# Patient Record
Sex: Female | Born: 1940 | Race: Black or African American | Hispanic: No | Marital: Married | Smoking: Former smoker
Health system: Southern US, Community
[De-identification: ages and names within clinical notes are randomized; demographics above are authoritative.]

## PROBLEM LIST (undated history)

## (undated) DIAGNOSIS — E7800 Pure hypercholesterolemia, unspecified: Secondary | ICD-10-CM

## (undated) DIAGNOSIS — I10 Essential (primary) hypertension: Secondary | ICD-10-CM

## (undated) DIAGNOSIS — N186 End stage renal disease: Secondary | ICD-10-CM

## (undated) DIAGNOSIS — Z992 Dependence on renal dialysis: Secondary | ICD-10-CM

## (undated) DIAGNOSIS — M1990 Unspecified osteoarthritis, unspecified site: Secondary | ICD-10-CM

## (undated) DIAGNOSIS — Z8701 Personal history of pneumonia (recurrent): Secondary | ICD-10-CM

## (undated) DIAGNOSIS — G629 Polyneuropathy, unspecified: Secondary | ICD-10-CM

## (undated) DIAGNOSIS — K219 Gastro-esophageal reflux disease without esophagitis: Secondary | ICD-10-CM

## (undated) HISTORY — PX: BONE MARROW BIOPSY: SHX199

## (undated) HISTORY — PX: BONE MARROW ASPIRATION: SHX1252

## (undated) HISTORY — DX: Gastro-esophageal reflux disease without esophagitis: K21.9

## (undated) HISTORY — PX: TUBAL LIGATION: SHX77

## (undated) HISTORY — PX: BREAST BIOPSY: SHX20

## (undated) HISTORY — PX: REFRACTIVE SURGERY: SHX103

## (undated) HISTORY — DX: Polyneuropathy, unspecified: G62.9

## (undated) HISTORY — DX: Pure hypercholesterolemia, unspecified: E78.00

---

## 2001-06-04 ENCOUNTER — Encounter (HOSPITAL_COMMUNITY): Admission: RE | Admit: 2001-06-04 | Discharge: 2001-07-04 | Payer: Self-pay | Admitting: Oncology

## 2001-06-27 ENCOUNTER — Encounter: Payer: Self-pay | Admitting: Internal Medicine

## 2001-06-27 ENCOUNTER — Ambulatory Visit (HOSPITAL_COMMUNITY): Admission: RE | Admit: 2001-06-27 | Discharge: 2001-06-27 | Payer: Self-pay | Admitting: Internal Medicine

## 2001-07-03 ENCOUNTER — Encounter: Admission: RE | Admit: 2001-07-03 | Discharge: 2001-07-03 | Payer: Self-pay | Admitting: Oncology

## 2001-07-14 ENCOUNTER — Encounter: Admission: RE | Admit: 2001-07-14 | Discharge: 2001-07-14 | Payer: Self-pay | Admitting: Oncology

## 2001-07-14 ENCOUNTER — Encounter (HOSPITAL_COMMUNITY): Admission: RE | Admit: 2001-07-14 | Discharge: 2001-08-13 | Payer: Self-pay | Admitting: Oncology

## 2001-08-29 ENCOUNTER — Encounter: Admission: RE | Admit: 2001-08-29 | Discharge: 2001-08-29 | Payer: Self-pay | Admitting: Oncology

## 2001-10-10 ENCOUNTER — Encounter (HOSPITAL_COMMUNITY): Admission: RE | Admit: 2001-10-10 | Discharge: 2001-11-09 | Payer: Self-pay | Admitting: Oncology

## 2001-10-20 ENCOUNTER — Observation Stay (HOSPITAL_COMMUNITY): Admission: EM | Admit: 2001-10-20 | Discharge: 2001-10-21 | Payer: Self-pay | Admitting: Emergency Medicine

## 2001-10-20 ENCOUNTER — Encounter: Payer: Self-pay | Admitting: Emergency Medicine

## 2001-11-14 ENCOUNTER — Encounter: Admission: RE | Admit: 2001-11-14 | Discharge: 2001-11-14 | Payer: Self-pay | Admitting: Oncology

## 2001-11-14 ENCOUNTER — Encounter (HOSPITAL_COMMUNITY): Admission: RE | Admit: 2001-11-14 | Discharge: 2001-12-14 | Payer: Self-pay | Admitting: Oncology

## 2001-12-26 ENCOUNTER — Encounter: Admission: RE | Admit: 2001-12-26 | Discharge: 2001-12-26 | Payer: Self-pay | Admitting: Oncology

## 2001-12-26 ENCOUNTER — Encounter (HOSPITAL_COMMUNITY): Admission: RE | Admit: 2001-12-26 | Discharge: 2002-01-25 | Payer: Self-pay | Admitting: Oncology

## 2002-03-06 ENCOUNTER — Encounter (HOSPITAL_COMMUNITY): Admission: RE | Admit: 2002-03-06 | Discharge: 2002-04-05 | Payer: Self-pay | Admitting: Oncology

## 2002-03-06 ENCOUNTER — Encounter: Admission: RE | Admit: 2002-03-06 | Discharge: 2002-03-06 | Payer: Self-pay | Admitting: Oncology

## 2002-04-16 ENCOUNTER — Encounter: Admission: RE | Admit: 2002-04-16 | Discharge: 2002-04-16 | Payer: Self-pay | Admitting: Oncology

## 2002-05-20 ENCOUNTER — Encounter: Admission: RE | Admit: 2002-05-20 | Discharge: 2002-05-20 | Payer: Self-pay | Admitting: Oncology

## 2002-05-20 ENCOUNTER — Encounter (HOSPITAL_COMMUNITY): Admission: RE | Admit: 2002-05-20 | Discharge: 2002-06-19 | Payer: Self-pay | Admitting: Oncology

## 2002-07-01 ENCOUNTER — Encounter: Admission: RE | Admit: 2002-07-01 | Discharge: 2002-07-01 | Payer: Self-pay | Admitting: Oncology

## 2002-10-27 ENCOUNTER — Encounter (HOSPITAL_COMMUNITY): Admission: RE | Admit: 2002-10-27 | Discharge: 2002-11-26 | Payer: Self-pay | Admitting: Oncology

## 2002-10-27 ENCOUNTER — Encounter: Admission: RE | Admit: 2002-10-27 | Discharge: 2002-10-27 | Payer: Self-pay | Admitting: Oncology

## 2002-10-29 ENCOUNTER — Encounter: Payer: Self-pay | Admitting: Internal Medicine

## 2002-10-29 ENCOUNTER — Ambulatory Visit (HOSPITAL_COMMUNITY): Admission: RE | Admit: 2002-10-29 | Discharge: 2002-10-29 | Payer: Self-pay | Admitting: Internal Medicine

## 2002-11-27 ENCOUNTER — Encounter: Admission: RE | Admit: 2002-11-27 | Discharge: 2002-11-27 | Payer: Self-pay | Admitting: Oncology

## 2002-11-27 ENCOUNTER — Encounter (HOSPITAL_COMMUNITY): Admission: RE | Admit: 2002-11-27 | Discharge: 2002-12-27 | Payer: Self-pay | Admitting: Oncology

## 2002-12-30 ENCOUNTER — Encounter: Admission: RE | Admit: 2002-12-30 | Discharge: 2002-12-30 | Payer: Self-pay | Admitting: Oncology

## 2002-12-30 ENCOUNTER — Encounter (HOSPITAL_COMMUNITY): Admission: RE | Admit: 2002-12-30 | Discharge: 2003-01-29 | Payer: Self-pay | Admitting: Oncology

## 2003-02-17 ENCOUNTER — Encounter (HOSPITAL_COMMUNITY): Admission: RE | Admit: 2003-02-17 | Discharge: 2003-03-19 | Payer: Self-pay | Admitting: Oncology

## 2003-02-17 ENCOUNTER — Encounter: Admission: RE | Admit: 2003-02-17 | Discharge: 2003-02-17 | Payer: Self-pay | Admitting: Oncology

## 2003-03-05 ENCOUNTER — Ambulatory Visit (HOSPITAL_COMMUNITY): Admission: RE | Admit: 2003-03-05 | Discharge: 2003-03-05 | Payer: Self-pay | Admitting: Internal Medicine

## 2003-04-28 ENCOUNTER — Encounter: Admission: RE | Admit: 2003-04-28 | Discharge: 2003-04-28 | Payer: Self-pay | Admitting: Oncology

## 2003-04-28 ENCOUNTER — Encounter (HOSPITAL_COMMUNITY): Admission: RE | Admit: 2003-04-28 | Discharge: 2003-05-28 | Payer: Self-pay | Admitting: Oncology

## 2003-05-06 ENCOUNTER — Ambulatory Visit (HOSPITAL_COMMUNITY): Admission: RE | Admit: 2003-05-06 | Discharge: 2003-05-06 | Payer: Self-pay | Admitting: Internal Medicine

## 2003-05-06 HISTORY — PX: ESOPHAGOGASTRODUODENOSCOPY: SHX1529

## 2003-05-07 ENCOUNTER — Ambulatory Visit (HOSPITAL_COMMUNITY): Admission: RE | Admit: 2003-05-07 | Discharge: 2003-05-07 | Payer: Self-pay | Admitting: Internal Medicine

## 2003-05-28 ENCOUNTER — Encounter: Admission: RE | Admit: 2003-05-28 | Discharge: 2003-05-28 | Payer: Self-pay | Admitting: Oncology

## 2003-05-28 ENCOUNTER — Encounter (HOSPITAL_COMMUNITY): Admission: RE | Admit: 2003-05-28 | Discharge: 2003-06-27 | Payer: Self-pay | Admitting: Oncology

## 2003-07-12 ENCOUNTER — Encounter: Admission: RE | Admit: 2003-07-12 | Discharge: 2003-07-12 | Payer: Self-pay | Admitting: Oncology

## 2003-08-16 ENCOUNTER — Encounter (HOSPITAL_COMMUNITY): Admission: RE | Admit: 2003-08-16 | Discharge: 2003-09-15 | Payer: Self-pay | Admitting: Oncology

## 2003-08-16 ENCOUNTER — Encounter: Admission: RE | Admit: 2003-08-16 | Discharge: 2003-08-16 | Payer: Self-pay | Admitting: Oncology

## 2003-08-23 ENCOUNTER — Ambulatory Visit (HOSPITAL_COMMUNITY): Admission: RE | Admit: 2003-08-23 | Discharge: 2003-08-23 | Payer: Self-pay | Admitting: Orthopedic Surgery

## 2003-10-04 ENCOUNTER — Encounter: Admission: RE | Admit: 2003-10-04 | Discharge: 2003-10-15 | Payer: Self-pay | Admitting: Oncology

## 2003-10-04 ENCOUNTER — Encounter (HOSPITAL_COMMUNITY): Admission: RE | Admit: 2003-10-04 | Discharge: 2003-10-15 | Payer: Self-pay | Admitting: Oncology

## 2003-10-12 ENCOUNTER — Ambulatory Visit: Payer: Self-pay | Admitting: Psychiatry

## 2003-10-25 ENCOUNTER — Encounter (HOSPITAL_COMMUNITY): Admission: RE | Admit: 2003-10-25 | Discharge: 2003-11-24 | Payer: Self-pay | Admitting: Oncology

## 2003-10-25 ENCOUNTER — Encounter: Admission: RE | Admit: 2003-10-25 | Discharge: 2003-10-25 | Payer: Self-pay | Admitting: Oncology

## 2003-11-29 ENCOUNTER — Encounter (HOSPITAL_COMMUNITY): Admission: RE | Admit: 2003-11-29 | Discharge: 2003-12-29 | Payer: Self-pay | Admitting: Oncology

## 2003-11-29 ENCOUNTER — Ambulatory Visit (HOSPITAL_COMMUNITY): Payer: Self-pay | Admitting: Oncology

## 2003-11-29 ENCOUNTER — Encounter: Admission: RE | Admit: 2003-11-29 | Discharge: 2003-11-29 | Payer: Self-pay | Admitting: Oncology

## 2003-12-21 ENCOUNTER — Ambulatory Visit: Payer: Self-pay | Admitting: Psychiatry

## 2004-01-18 ENCOUNTER — Encounter (HOSPITAL_COMMUNITY): Admission: RE | Admit: 2004-01-18 | Discharge: 2004-02-17 | Payer: Self-pay | Admitting: Oncology

## 2004-01-18 ENCOUNTER — Ambulatory Visit (HOSPITAL_COMMUNITY): Payer: Self-pay | Admitting: Oncology

## 2004-01-18 ENCOUNTER — Encounter: Admission: RE | Admit: 2004-01-18 | Discharge: 2004-01-18 | Payer: Self-pay | Admitting: Oncology

## 2004-02-15 ENCOUNTER — Ambulatory Visit: Payer: Self-pay | Admitting: Psychiatry

## 2004-02-21 ENCOUNTER — Ambulatory Visit: Payer: Self-pay | Admitting: Orthopedic Surgery

## 2004-03-16 ENCOUNTER — Encounter: Admission: RE | Admit: 2004-03-16 | Discharge: 2004-03-16 | Payer: Self-pay | Admitting: Oncology

## 2004-03-16 ENCOUNTER — Ambulatory Visit (HOSPITAL_COMMUNITY): Payer: Self-pay | Admitting: Oncology

## 2004-03-16 ENCOUNTER — Encounter (HOSPITAL_COMMUNITY): Admission: RE | Admit: 2004-03-16 | Discharge: 2004-04-15 | Payer: Self-pay | Admitting: Oncology

## 2004-04-11 ENCOUNTER — Ambulatory Visit: Payer: Self-pay | Admitting: Psychiatry

## 2004-05-12 ENCOUNTER — Encounter: Admission: RE | Admit: 2004-05-12 | Discharge: 2004-05-12 | Payer: Self-pay | Admitting: Oncology

## 2004-05-12 ENCOUNTER — Encounter (HOSPITAL_COMMUNITY): Admission: RE | Admit: 2004-05-12 | Discharge: 2004-06-11 | Payer: Self-pay | Admitting: Oncology

## 2004-05-12 ENCOUNTER — Ambulatory Visit (HOSPITAL_COMMUNITY): Payer: Self-pay | Admitting: Oncology

## 2004-06-29 ENCOUNTER — Ambulatory Visit: Payer: Self-pay | Admitting: Psychiatry

## 2004-06-30 ENCOUNTER — Ambulatory Visit (HOSPITAL_COMMUNITY): Payer: Self-pay | Admitting: Oncology

## 2004-07-14 ENCOUNTER — Encounter: Admission: RE | Admit: 2004-07-14 | Discharge: 2004-07-14 | Payer: Self-pay | Admitting: Oncology

## 2004-07-14 ENCOUNTER — Encounter (HOSPITAL_COMMUNITY): Admission: RE | Admit: 2004-07-14 | Discharge: 2004-08-13 | Payer: Self-pay | Admitting: Oncology

## 2004-08-25 ENCOUNTER — Encounter: Admission: RE | Admit: 2004-08-25 | Discharge: 2004-08-25 | Payer: Self-pay | Admitting: Oncology

## 2004-08-25 ENCOUNTER — Ambulatory Visit (HOSPITAL_COMMUNITY): Payer: Self-pay | Admitting: Oncology

## 2004-08-25 ENCOUNTER — Encounter (HOSPITAL_COMMUNITY): Admission: RE | Admit: 2004-08-25 | Discharge: 2004-09-24 | Payer: Self-pay | Admitting: Oncology

## 2004-09-28 ENCOUNTER — Ambulatory Visit: Payer: Self-pay | Admitting: Psychiatry

## 2004-10-03 ENCOUNTER — Encounter (HOSPITAL_COMMUNITY): Admission: RE | Admit: 2004-10-03 | Discharge: 2004-10-14 | Payer: Self-pay | Admitting: Oncology

## 2004-10-03 ENCOUNTER — Encounter: Admission: RE | Admit: 2004-10-03 | Discharge: 2004-10-14 | Payer: Self-pay | Admitting: Oncology

## 2004-10-20 ENCOUNTER — Ambulatory Visit (HOSPITAL_COMMUNITY): Payer: Self-pay | Admitting: Oncology

## 2004-10-20 ENCOUNTER — Encounter: Admission: RE | Admit: 2004-10-20 | Discharge: 2004-10-20 | Payer: Self-pay | Admitting: Oncology

## 2004-12-13 ENCOUNTER — Ambulatory Visit (HOSPITAL_COMMUNITY): Payer: Self-pay | Admitting: Oncology

## 2004-12-13 ENCOUNTER — Encounter (HOSPITAL_COMMUNITY): Admission: RE | Admit: 2004-12-13 | Discharge: 2005-01-12 | Payer: Self-pay | Admitting: Oncology

## 2004-12-13 ENCOUNTER — Encounter: Admission: RE | Admit: 2004-12-13 | Discharge: 2004-12-13 | Payer: Self-pay | Admitting: Oncology

## 2004-12-27 ENCOUNTER — Ambulatory Visit: Payer: Self-pay | Admitting: Internal Medicine

## 2004-12-28 ENCOUNTER — Ambulatory Visit: Payer: Self-pay | Admitting: Psychiatry

## 2005-01-30 ENCOUNTER — Ambulatory Visit (HOSPITAL_COMMUNITY): Payer: Self-pay | Admitting: Oncology

## 2005-01-30 ENCOUNTER — Encounter: Admission: RE | Admit: 2005-01-30 | Discharge: 2005-01-30 | Payer: Self-pay | Admitting: Oncology

## 2005-01-30 ENCOUNTER — Encounter (HOSPITAL_COMMUNITY): Admission: RE | Admit: 2005-01-30 | Discharge: 2005-03-01 | Payer: Self-pay | Admitting: Oncology

## 2005-03-07 ENCOUNTER — Encounter: Admission: RE | Admit: 2005-03-07 | Discharge: 2005-03-07 | Payer: Self-pay | Admitting: Oncology

## 2005-03-07 ENCOUNTER — Encounter (HOSPITAL_COMMUNITY): Admission: RE | Admit: 2005-03-07 | Discharge: 2005-04-06 | Payer: Self-pay | Admitting: Oncology

## 2005-03-27 ENCOUNTER — Ambulatory Visit (HOSPITAL_COMMUNITY): Payer: Self-pay | Admitting: Psychiatry

## 2005-04-04 ENCOUNTER — Ambulatory Visit (HOSPITAL_COMMUNITY): Payer: Self-pay | Admitting: Oncology

## 2005-05-02 ENCOUNTER — Encounter (HOSPITAL_COMMUNITY): Admission: RE | Admit: 2005-05-02 | Discharge: 2005-06-01 | Payer: Self-pay | Admitting: Oncology

## 2005-05-02 ENCOUNTER — Encounter: Admission: RE | Admit: 2005-05-02 | Discharge: 2005-05-02 | Payer: Self-pay | Admitting: Oncology

## 2005-05-16 ENCOUNTER — Ambulatory Visit (HOSPITAL_COMMUNITY): Admission: RE | Admit: 2005-05-16 | Discharge: 2005-05-16 | Payer: Self-pay | Admitting: Internal Medicine

## 2005-05-22 ENCOUNTER — Ambulatory Visit (HOSPITAL_COMMUNITY): Admission: RE | Admit: 2005-05-22 | Discharge: 2005-05-22 | Payer: Self-pay | Admitting: Internal Medicine

## 2005-05-30 ENCOUNTER — Ambulatory Visit (HOSPITAL_COMMUNITY): Payer: Self-pay | Admitting: Oncology

## 2005-06-13 ENCOUNTER — Encounter: Admission: RE | Admit: 2005-06-13 | Discharge: 2005-06-13 | Payer: Self-pay | Admitting: Oncology

## 2005-06-13 ENCOUNTER — Encounter (HOSPITAL_COMMUNITY): Admission: RE | Admit: 2005-06-13 | Discharge: 2005-07-13 | Payer: Self-pay | Admitting: Oncology

## 2005-06-26 ENCOUNTER — Ambulatory Visit (HOSPITAL_COMMUNITY): Payer: Self-pay | Admitting: Psychiatry

## 2005-07-27 ENCOUNTER — Encounter: Admission: RE | Admit: 2005-07-27 | Discharge: 2005-07-27 | Payer: Self-pay | Admitting: Oncology

## 2005-07-27 ENCOUNTER — Encounter (HOSPITAL_COMMUNITY): Admission: RE | Admit: 2005-07-27 | Discharge: 2005-08-26 | Payer: Self-pay | Admitting: Oncology

## 2005-07-27 ENCOUNTER — Ambulatory Visit (HOSPITAL_COMMUNITY): Payer: Self-pay | Admitting: Oncology

## 2005-09-13 ENCOUNTER — Ambulatory Visit (HOSPITAL_COMMUNITY): Payer: Self-pay | Admitting: Psychiatry

## 2005-09-19 ENCOUNTER — Encounter (HOSPITAL_COMMUNITY): Admission: RE | Admit: 2005-09-19 | Discharge: 2005-10-12 | Payer: Self-pay | Admitting: Oncology

## 2005-09-19 ENCOUNTER — Encounter: Admission: RE | Admit: 2005-09-19 | Discharge: 2005-10-12 | Payer: Self-pay | Admitting: Oncology

## 2005-10-10 ENCOUNTER — Ambulatory Visit (HOSPITAL_COMMUNITY): Payer: Self-pay | Admitting: Oncology

## 2005-10-31 ENCOUNTER — Encounter (HOSPITAL_COMMUNITY): Admission: RE | Admit: 2005-10-31 | Discharge: 2005-11-30 | Payer: Self-pay | Admitting: Oncology

## 2005-10-31 ENCOUNTER — Encounter: Admission: RE | Admit: 2005-10-31 | Discharge: 2005-10-31 | Payer: Self-pay | Admitting: Oncology

## 2005-11-08 ENCOUNTER — Ambulatory Visit (HOSPITAL_COMMUNITY): Payer: Self-pay | Admitting: Psychiatry

## 2005-12-05 ENCOUNTER — Ambulatory Visit (HOSPITAL_COMMUNITY): Payer: Self-pay | Admitting: Oncology

## 2005-12-05 ENCOUNTER — Encounter (HOSPITAL_COMMUNITY): Admission: RE | Admit: 2005-12-05 | Discharge: 2006-01-04 | Payer: Self-pay | Admitting: Oncology

## 2006-01-01 ENCOUNTER — Ambulatory Visit (HOSPITAL_COMMUNITY): Payer: Self-pay | Admitting: Psychiatry

## 2006-01-17 ENCOUNTER — Encounter (HOSPITAL_COMMUNITY): Admission: RE | Admit: 2006-01-17 | Discharge: 2006-02-16 | Payer: Self-pay | Admitting: Oncology

## 2006-01-31 ENCOUNTER — Ambulatory Visit (HOSPITAL_COMMUNITY): Payer: Self-pay | Admitting: Psychiatry

## 2006-02-19 ENCOUNTER — Encounter (HOSPITAL_COMMUNITY): Admission: RE | Admit: 2006-02-19 | Discharge: 2006-03-21 | Payer: Self-pay | Admitting: Oncology

## 2006-02-19 ENCOUNTER — Ambulatory Visit (HOSPITAL_COMMUNITY): Payer: Self-pay | Admitting: Oncology

## 2006-03-05 ENCOUNTER — Ambulatory Visit (HOSPITAL_COMMUNITY): Payer: Self-pay | Admitting: Psychiatry

## 2006-04-09 ENCOUNTER — Encounter (HOSPITAL_COMMUNITY): Admission: RE | Admit: 2006-04-09 | Discharge: 2006-05-09 | Payer: Self-pay | Admitting: Oncology

## 2006-04-23 ENCOUNTER — Ambulatory Visit (HOSPITAL_COMMUNITY): Payer: Self-pay | Admitting: Oncology

## 2006-05-02 ENCOUNTER — Ambulatory Visit (HOSPITAL_COMMUNITY): Payer: Self-pay | Admitting: Psychiatry

## 2006-05-28 ENCOUNTER — Encounter (HOSPITAL_COMMUNITY): Admission: RE | Admit: 2006-05-28 | Discharge: 2006-06-27 | Payer: Self-pay | Admitting: Oncology

## 2006-05-30 ENCOUNTER — Ambulatory Visit (HOSPITAL_COMMUNITY): Payer: Self-pay | Admitting: Psychiatry

## 2006-07-02 ENCOUNTER — Encounter (HOSPITAL_COMMUNITY): Admission: RE | Admit: 2006-07-02 | Discharge: 2006-08-01 | Payer: Self-pay | Admitting: Oncology

## 2006-07-02 ENCOUNTER — Ambulatory Visit (HOSPITAL_COMMUNITY): Payer: Self-pay | Admitting: Oncology

## 2006-08-08 ENCOUNTER — Encounter (HOSPITAL_COMMUNITY): Admission: RE | Admit: 2006-08-08 | Discharge: 2006-09-07 | Payer: Self-pay | Admitting: Oncology

## 2006-08-15 ENCOUNTER — Ambulatory Visit (HOSPITAL_COMMUNITY): Payer: Self-pay | Admitting: Psychiatry

## 2006-09-12 ENCOUNTER — Encounter (HOSPITAL_COMMUNITY): Admission: RE | Admit: 2006-09-12 | Discharge: 2006-10-12 | Payer: Self-pay | Admitting: Oncology

## 2006-09-12 ENCOUNTER — Ambulatory Visit (HOSPITAL_COMMUNITY): Payer: Self-pay | Admitting: Psychiatry

## 2006-09-12 ENCOUNTER — Ambulatory Visit (HOSPITAL_COMMUNITY): Payer: Self-pay | Admitting: Oncology

## 2006-10-17 ENCOUNTER — Encounter (HOSPITAL_COMMUNITY): Admission: RE | Admit: 2006-10-17 | Discharge: 2006-11-16 | Payer: Self-pay | Admitting: Oncology

## 2006-11-12 ENCOUNTER — Ambulatory Visit (HOSPITAL_COMMUNITY): Payer: Self-pay | Admitting: Oncology

## 2006-11-12 ENCOUNTER — Ambulatory Visit (HOSPITAL_COMMUNITY): Payer: Self-pay | Admitting: Psychiatry

## 2006-11-15 ENCOUNTER — Other Ambulatory Visit: Admission: RE | Admit: 2006-11-15 | Discharge: 2006-11-15 | Payer: Self-pay | Admitting: Obstetrics and Gynecology

## 2006-12-06 ENCOUNTER — Encounter (HOSPITAL_COMMUNITY): Admission: RE | Admit: 2006-12-06 | Discharge: 2007-01-05 | Payer: Self-pay | Admitting: Oncology

## 2007-01-07 ENCOUNTER — Ambulatory Visit (HOSPITAL_COMMUNITY): Payer: Self-pay | Admitting: Oncology

## 2007-01-07 ENCOUNTER — Encounter (HOSPITAL_COMMUNITY): Admission: RE | Admit: 2007-01-07 | Discharge: 2007-01-15 | Payer: Self-pay | Admitting: Oncology

## 2007-01-07 ENCOUNTER — Other Ambulatory Visit (HOSPITAL_COMMUNITY): Payer: Self-pay | Admitting: Oncology

## 2007-01-07 ENCOUNTER — Ambulatory Visit (HOSPITAL_COMMUNITY): Payer: Self-pay | Admitting: Psychiatry

## 2007-02-07 ENCOUNTER — Encounter (HOSPITAL_COMMUNITY): Admission: RE | Admit: 2007-02-07 | Discharge: 2007-03-09 | Payer: Self-pay | Admitting: Oncology

## 2007-02-07 ENCOUNTER — Ambulatory Visit (HOSPITAL_COMMUNITY): Payer: Self-pay | Admitting: Oncology

## 2007-03-06 ENCOUNTER — Ambulatory Visit (HOSPITAL_COMMUNITY): Payer: Self-pay | Admitting: Oncology

## 2007-03-06 ENCOUNTER — Ambulatory Visit (HOSPITAL_COMMUNITY): Payer: Self-pay | Admitting: Psychiatry

## 2007-04-03 ENCOUNTER — Ambulatory Visit (HOSPITAL_COMMUNITY): Admission: RE | Admit: 2007-04-03 | Discharge: 2007-04-03 | Payer: Self-pay | Admitting: Nephrology

## 2007-04-03 ENCOUNTER — Ambulatory Visit: Payer: Self-pay | Admitting: Oncology

## 2007-04-03 ENCOUNTER — Encounter (HOSPITAL_COMMUNITY): Admission: RE | Admit: 2007-04-03 | Discharge: 2007-05-03 | Payer: Self-pay | Admitting: Oncology

## 2007-05-01 ENCOUNTER — Ambulatory Visit (HOSPITAL_COMMUNITY): Payer: Self-pay | Admitting: Oncology

## 2007-05-01 ENCOUNTER — Other Ambulatory Visit (HOSPITAL_COMMUNITY): Payer: Self-pay | Admitting: Oncology

## 2007-05-29 ENCOUNTER — Encounter (HOSPITAL_COMMUNITY): Admission: RE | Admit: 2007-05-29 | Discharge: 2007-06-28 | Payer: Self-pay | Admitting: Oncology

## 2007-06-02 ENCOUNTER — Ambulatory Visit: Payer: Self-pay | Admitting: Gastroenterology

## 2007-06-03 ENCOUNTER — Ambulatory Visit (HOSPITAL_COMMUNITY): Payer: Self-pay | Admitting: Psychiatry

## 2007-07-18 ENCOUNTER — Ambulatory Visit (HOSPITAL_COMMUNITY): Payer: Self-pay | Admitting: Oncology

## 2007-07-18 ENCOUNTER — Encounter (HOSPITAL_COMMUNITY): Admission: RE | Admit: 2007-07-18 | Discharge: 2007-08-17 | Payer: Self-pay | Admitting: Oncology

## 2007-08-07 DIAGNOSIS — K219 Gastro-esophageal reflux disease without esophagitis: Secondary | ICD-10-CM | POA: Insufficient documentation

## 2007-08-07 DIAGNOSIS — M129 Arthropathy, unspecified: Secondary | ICD-10-CM | POA: Insufficient documentation

## 2007-08-07 DIAGNOSIS — Z8719 Personal history of other diseases of the digestive system: Secondary | ICD-10-CM

## 2007-08-07 DIAGNOSIS — J45909 Unspecified asthma, uncomplicated: Secondary | ICD-10-CM | POA: Insufficient documentation

## 2007-08-07 DIAGNOSIS — M62838 Other muscle spasm: Secondary | ICD-10-CM | POA: Insufficient documentation

## 2007-08-07 DIAGNOSIS — I10 Essential (primary) hypertension: Secondary | ICD-10-CM | POA: Insufficient documentation

## 2007-08-07 DIAGNOSIS — F329 Major depressive disorder, single episode, unspecified: Secondary | ICD-10-CM

## 2007-08-07 DIAGNOSIS — F419 Anxiety disorder, unspecified: Secondary | ICD-10-CM | POA: Insufficient documentation

## 2007-08-07 DIAGNOSIS — M810 Age-related osteoporosis without current pathological fracture: Secondary | ICD-10-CM

## 2007-08-07 DIAGNOSIS — D638 Anemia in other chronic diseases classified elsewhere: Secondary | ICD-10-CM

## 2007-08-07 DIAGNOSIS — R1310 Dysphagia, unspecified: Secondary | ICD-10-CM | POA: Insufficient documentation

## 2007-09-02 ENCOUNTER — Ambulatory Visit (HOSPITAL_COMMUNITY): Payer: Self-pay | Admitting: Psychiatry

## 2007-09-12 ENCOUNTER — Encounter (HOSPITAL_COMMUNITY): Admission: RE | Admit: 2007-09-12 | Discharge: 2007-10-12 | Payer: Self-pay | Admitting: Oncology

## 2007-09-12 ENCOUNTER — Ambulatory Visit (HOSPITAL_COMMUNITY): Payer: Self-pay | Admitting: Oncology

## 2007-10-02 ENCOUNTER — Ambulatory Visit (HOSPITAL_COMMUNITY): Payer: Self-pay | Admitting: Psychiatry

## 2007-10-24 ENCOUNTER — Encounter (HOSPITAL_COMMUNITY): Admission: RE | Admit: 2007-10-24 | Discharge: 2007-11-23 | Payer: Self-pay | Admitting: Oncology

## 2007-11-14 ENCOUNTER — Ambulatory Visit (HOSPITAL_COMMUNITY): Payer: Self-pay | Admitting: Oncology

## 2007-11-14 ENCOUNTER — Other Ambulatory Visit (HOSPITAL_COMMUNITY): Payer: Self-pay | Admitting: Oncology

## 2007-11-25 ENCOUNTER — Other Ambulatory Visit: Admission: RE | Admit: 2007-11-25 | Discharge: 2007-11-25 | Payer: Self-pay | Admitting: Obstetrics and Gynecology

## 2007-12-02 ENCOUNTER — Ambulatory Visit (HOSPITAL_COMMUNITY): Payer: Self-pay | Admitting: Psychiatry

## 2007-12-05 ENCOUNTER — Encounter (HOSPITAL_COMMUNITY): Admission: RE | Admit: 2007-12-05 | Discharge: 2008-01-04 | Payer: Self-pay | Admitting: Oncology

## 2007-12-23 ENCOUNTER — Ambulatory Visit (HOSPITAL_COMMUNITY): Admission: RE | Admit: 2007-12-23 | Discharge: 2007-12-23 | Payer: Self-pay | Admitting: Ophthalmology

## 2008-01-06 ENCOUNTER — Ambulatory Visit (HOSPITAL_COMMUNITY): Admission: RE | Admit: 2008-01-06 | Discharge: 2008-01-06 | Payer: Self-pay | Admitting: Ophthalmology

## 2008-01-19 ENCOUNTER — Ambulatory Visit (HOSPITAL_COMMUNITY): Payer: Self-pay | Admitting: Oncology

## 2008-01-19 ENCOUNTER — Encounter (HOSPITAL_COMMUNITY): Admission: RE | Admit: 2008-01-19 | Discharge: 2008-02-18 | Payer: Self-pay | Admitting: Oncology

## 2008-01-29 ENCOUNTER — Ambulatory Visit (HOSPITAL_COMMUNITY): Payer: Self-pay | Admitting: Psychiatry

## 2008-03-01 ENCOUNTER — Encounter (HOSPITAL_COMMUNITY): Admission: RE | Admit: 2008-03-01 | Discharge: 2008-03-31 | Payer: Self-pay | Admitting: Oncology

## 2008-03-22 ENCOUNTER — Ambulatory Visit (HOSPITAL_COMMUNITY): Payer: Self-pay | Admitting: Oncology

## 2008-03-25 ENCOUNTER — Ambulatory Visit (HOSPITAL_COMMUNITY): Payer: Self-pay | Admitting: Psychiatry

## 2008-04-13 ENCOUNTER — Encounter (HOSPITAL_COMMUNITY): Admission: RE | Admit: 2008-04-13 | Discharge: 2008-05-14 | Payer: Self-pay | Admitting: Oncology

## 2008-05-25 ENCOUNTER — Encounter (HOSPITAL_COMMUNITY): Admission: RE | Admit: 2008-05-25 | Discharge: 2008-06-24 | Payer: Self-pay | Admitting: Oncology

## 2008-05-25 ENCOUNTER — Ambulatory Visit (HOSPITAL_COMMUNITY): Payer: Self-pay | Admitting: Oncology

## 2008-05-27 ENCOUNTER — Ambulatory Visit (HOSPITAL_COMMUNITY): Payer: Self-pay | Admitting: Psychiatry

## 2008-06-22 ENCOUNTER — Ambulatory Visit (HOSPITAL_COMMUNITY): Payer: Self-pay | Admitting: Psychiatry

## 2008-07-06 ENCOUNTER — Other Ambulatory Visit (HOSPITAL_COMMUNITY): Payer: Self-pay | Admitting: Oncology

## 2008-07-06 ENCOUNTER — Encounter (HOSPITAL_COMMUNITY): Admission: RE | Admit: 2008-07-06 | Discharge: 2008-08-05 | Payer: Self-pay | Admitting: Oncology

## 2008-07-27 ENCOUNTER — Ambulatory Visit (HOSPITAL_COMMUNITY): Payer: Self-pay | Admitting: Oncology

## 2008-08-19 ENCOUNTER — Ambulatory Visit (HOSPITAL_COMMUNITY): Payer: Self-pay | Admitting: Psychiatry

## 2008-08-31 ENCOUNTER — Encounter (HOSPITAL_COMMUNITY): Admission: RE | Admit: 2008-08-31 | Discharge: 2008-09-30 | Payer: Self-pay | Admitting: Oncology

## 2008-08-31 ENCOUNTER — Other Ambulatory Visit (HOSPITAL_COMMUNITY): Payer: Self-pay | Admitting: Oncology

## 2008-09-29 ENCOUNTER — Ambulatory Visit (HOSPITAL_COMMUNITY): Payer: Self-pay | Admitting: Oncology

## 2008-09-29 ENCOUNTER — Other Ambulatory Visit (HOSPITAL_COMMUNITY): Payer: Self-pay | Admitting: Oncology

## 2008-10-27 ENCOUNTER — Encounter (HOSPITAL_COMMUNITY): Admission: RE | Admit: 2008-10-27 | Discharge: 2008-11-26 | Payer: Self-pay | Admitting: Oncology

## 2008-11-18 ENCOUNTER — Ambulatory Visit (HOSPITAL_COMMUNITY): Payer: Self-pay | Admitting: Psychiatry

## 2008-12-02 ENCOUNTER — Ambulatory Visit (HOSPITAL_COMMUNITY): Payer: Self-pay | Admitting: Oncology

## 2008-12-02 ENCOUNTER — Encounter (HOSPITAL_COMMUNITY): Admission: RE | Admit: 2008-12-02 | Discharge: 2009-01-01 | Payer: Self-pay | Admitting: Oncology

## 2009-01-14 ENCOUNTER — Encounter (HOSPITAL_COMMUNITY): Admission: RE | Admit: 2009-01-14 | Discharge: 2009-01-14 | Payer: Self-pay | Admitting: Oncology

## 2009-02-11 ENCOUNTER — Other Ambulatory Visit (HOSPITAL_COMMUNITY): Payer: Self-pay | Admitting: Oncology

## 2009-02-11 ENCOUNTER — Ambulatory Visit (HOSPITAL_COMMUNITY): Payer: Self-pay | Admitting: Oncology

## 2009-02-11 ENCOUNTER — Encounter (HOSPITAL_COMMUNITY): Admission: RE | Admit: 2009-02-11 | Discharge: 2009-03-13 | Payer: Self-pay | Admitting: Oncology

## 2009-02-15 ENCOUNTER — Ambulatory Visit (HOSPITAL_COMMUNITY): Payer: Self-pay | Admitting: Psychiatry

## 2009-03-01 ENCOUNTER — Ambulatory Visit (HOSPITAL_COMMUNITY): Admission: RE | Admit: 2009-03-01 | Discharge: 2009-03-01 | Payer: Self-pay | Admitting: Internal Medicine

## 2009-03-11 ENCOUNTER — Other Ambulatory Visit (HOSPITAL_COMMUNITY): Payer: Self-pay | Admitting: Oncology

## 2009-03-21 ENCOUNTER — Encounter (HOSPITAL_COMMUNITY): Admission: RE | Admit: 2009-03-21 | Discharge: 2009-04-20 | Payer: Self-pay | Admitting: Oncology

## 2009-04-07 ENCOUNTER — Ambulatory Visit (HOSPITAL_COMMUNITY): Payer: Self-pay | Admitting: Oncology

## 2009-05-19 ENCOUNTER — Ambulatory Visit (HOSPITAL_COMMUNITY): Payer: Self-pay | Admitting: Psychiatry

## 2009-06-10 ENCOUNTER — Ambulatory Visit (HOSPITAL_COMMUNITY): Payer: Self-pay | Admitting: Oncology

## 2009-06-10 ENCOUNTER — Encounter (HOSPITAL_COMMUNITY): Admission: RE | Admit: 2009-06-10 | Discharge: 2009-07-10 | Payer: Self-pay | Admitting: Oncology

## 2009-07-13 ENCOUNTER — Other Ambulatory Visit (HOSPITAL_COMMUNITY): Payer: Self-pay | Admitting: Oncology

## 2009-07-13 ENCOUNTER — Encounter (HOSPITAL_COMMUNITY): Admission: RE | Admit: 2009-07-13 | Discharge: 2009-08-12 | Payer: Self-pay | Admitting: Oncology

## 2009-07-27 ENCOUNTER — Ambulatory Visit (HOSPITAL_COMMUNITY): Payer: Self-pay | Admitting: Oncology

## 2009-07-28 ENCOUNTER — Ambulatory Visit (HOSPITAL_COMMUNITY): Payer: Self-pay | Admitting: Oncology

## 2009-07-29 ENCOUNTER — Encounter (INDEPENDENT_AMBULATORY_CARE_PROVIDER_SITE_OTHER): Payer: Self-pay

## 2009-08-02 ENCOUNTER — Encounter (INDEPENDENT_AMBULATORY_CARE_PROVIDER_SITE_OTHER): Payer: Self-pay | Admitting: *Deleted

## 2009-08-15 ENCOUNTER — Encounter (HOSPITAL_COMMUNITY): Admission: RE | Admit: 2009-08-15 | Discharge: 2009-09-14 | Payer: Self-pay | Admitting: Oncology

## 2009-08-18 ENCOUNTER — Ambulatory Visit (HOSPITAL_COMMUNITY): Payer: Self-pay | Admitting: Psychiatry

## 2009-09-20 ENCOUNTER — Ambulatory Visit (HOSPITAL_COMMUNITY): Payer: Self-pay | Admitting: Oncology

## 2009-09-20 ENCOUNTER — Other Ambulatory Visit (HOSPITAL_COMMUNITY): Payer: Self-pay | Admitting: Oncology

## 2009-09-20 ENCOUNTER — Encounter (HOSPITAL_COMMUNITY): Admission: RE | Admit: 2009-09-20 | Discharge: 2009-10-14 | Payer: Self-pay | Admitting: Oncology

## 2009-11-29 ENCOUNTER — Ambulatory Visit (HOSPITAL_COMMUNITY): Payer: Self-pay | Admitting: Oncology

## 2009-11-29 ENCOUNTER — Encounter (HOSPITAL_COMMUNITY)
Admission: RE | Admit: 2009-11-29 | Discharge: 2009-12-29 | Payer: Self-pay | Source: Home / Self Care | Attending: Oncology | Admitting: Oncology

## 2009-12-27 ENCOUNTER — Encounter (HOSPITAL_COMMUNITY)
Admission: RE | Admit: 2009-12-27 | Discharge: 2010-01-26 | Payer: Self-pay | Source: Home / Self Care | Attending: Nephrology | Admitting: Nephrology

## 2010-01-05 ENCOUNTER — Ambulatory Visit (HOSPITAL_COMMUNITY): Payer: Self-pay | Admitting: Psychiatry

## 2010-01-30 ENCOUNTER — Ambulatory Visit (HOSPITAL_COMMUNITY)
Admission: RE | Admit: 2010-01-30 | Discharge: 2010-02-14 | Payer: Self-pay | Source: Home / Self Care | Attending: Oncology | Admitting: Oncology

## 2010-01-30 ENCOUNTER — Other Ambulatory Visit (HOSPITAL_COMMUNITY): Payer: Self-pay | Admitting: Oncology

## 2010-01-30 ENCOUNTER — Encounter (HOSPITAL_COMMUNITY)
Admission: RE | Admit: 2010-01-30 | Discharge: 2010-02-14 | Payer: Self-pay | Source: Home / Self Care | Attending: Oncology | Admitting: Oncology

## 2010-02-01 LAB — CBC
HCT: 34.9 % — ABNORMAL LOW (ref 36.0–46.0)
Hemoglobin: 11.6 g/dL — ABNORMAL LOW (ref 12.0–15.0)
MCH: 26.7 pg (ref 26.0–34.0)
MCHC: 33.2 g/dL (ref 30.0–36.0)
MCV: 80.2 fL (ref 78.0–100.0)
Platelets: 221 10*3/uL (ref 150–400)
RBC: 4.35 MIL/uL (ref 3.87–5.11)
RDW: 14.8 % (ref 11.5–15.5)
WBC: 5.7 10*3/uL (ref 4.0–10.5)

## 2010-02-14 ENCOUNTER — Ambulatory Visit (HOSPITAL_COMMUNITY)
Admission: RE | Admit: 2010-02-14 | Discharge: 2010-02-14 | Payer: Self-pay | Source: Home / Self Care | Attending: Psychiatry | Admitting: Psychiatry

## 2010-02-14 NOTE — Letter (Signed)
Summary: Recall Colonoscopy/Endoscopy, Change to Office Visit  [facility]  [address]   [place], [place] [address]   Phone: [phone]  Fax: [phone]      July 29, 2009   [name] [address] [place], [place]  [address] Jun 09, 1940   Dear [name],   According to our records, it is time for you to schedule a Colonoscopy/Endoscopy. However, after reviewing your medical record, we recommend an office visit in order to determine your need for a repeat procedure.  Please call [phone] at your convenience to schedule an office visit. If you have any questions or concerns, please feel free to contact our office.   Sincerely,   Cloria Spring LPN  [facility] Ph: [phone]   Fax: [phone]

## 2010-02-14 NOTE — Letter (Signed)
Summary: Scheduled Appointment  [facility]  [address]   [place], [place] [address]   Phone: [phone]  Fax: [phone]    August 02, 2009   Dear: [name]            DOB: 1940/10/05    I have been instructed to schedule you an appointment in our office.  Your appointment is as follows:   Date:               August 29, 2009   Time:                2PM    Please be here 15 minutes early.   Provider:            DR FIELDS    Please contact the office if you need to reschedule this appointment for a more convenient time.   Thank you,    Diana Eves       [facility] Ph: [phone]   Fax: [phone]

## 2010-02-24 ENCOUNTER — Encounter: Payer: Self-pay | Admitting: Internal Medicine

## 2010-02-27 ENCOUNTER — Encounter: Payer: Self-pay | Admitting: Internal Medicine

## 2010-03-03 ENCOUNTER — Ambulatory Visit (HOSPITAL_COMMUNITY): Payer: No Typology Code available for payment source

## 2010-03-03 DIAGNOSIS — N189 Chronic kidney disease, unspecified: Secondary | ICD-10-CM

## 2010-03-03 DIAGNOSIS — M810 Age-related osteoporosis without current pathological fracture: Secondary | ICD-10-CM

## 2010-03-03 DIAGNOSIS — D638 Anemia in other chronic diseases classified elsewhere: Secondary | ICD-10-CM

## 2010-03-03 DIAGNOSIS — K219 Gastro-esophageal reflux disease without esophagitis: Secondary | ICD-10-CM

## 2010-03-08 NOTE — Letter (Signed)
Summary: [facility]CS TRIAGE  [facility] TRIAGE   Imported By: Rexene Alberts 02/24/2010 15:47:34  _____________________________________________________________________  External Attachment:    Type:   Image     Comment:   External Document  Appended Document: [facility]CS TRIAGE ok as is  Appended Document: [facility] TRIAGE Rx and instructions mailed to pt.

## 2010-03-08 NOTE — Letter (Signed)
Summary: INSTRUCTIONS FOR TCS  INSTRUCTIONS FOR TCS   Imported By: Rexene Alberts 02/27/2010 11:47:19  _____________________________________________________________________  External Attachment:    Type:   Image     Comment:   External Document

## 2010-03-10 ENCOUNTER — Ambulatory Visit (HOSPITAL_COMMUNITY)
Admission: RE | Admit: 2010-03-10 | Discharge: 2010-03-10 | Disposition: A | Discharge status: Home / Self Care | Payer: No Typology Code available for payment source | Source: Ambulatory Visit | Attending: Internal Medicine | Admitting: Internal Medicine

## 2010-03-10 ENCOUNTER — Encounter: Payer: No Typology Code available for payment source | Admitting: Internal Medicine

## 2010-03-10 DIAGNOSIS — Z1211 Encounter for screening for malignant neoplasm of colon: Secondary | ICD-10-CM | POA: Insufficient documentation

## 2010-03-10 DIAGNOSIS — K6389 Other specified diseases of intestine: Secondary | ICD-10-CM

## 2010-03-10 HISTORY — PX: COLONOSCOPY: SHX174

## 2010-03-22 ENCOUNTER — Ambulatory Visit (HOSPITAL_COMMUNITY): Payer: Self-pay | Admitting: Oncology

## 2010-03-22 NOTE — Op Note (Addendum)
  NAMEMANDEE, [name]               ACCOUNT NO.:  [id number]  MEDICAL RECORD NO.:  [id number]           PATIENT TYPE:  O  LOCATION:  DAYP                          FACILITY:  [facility]  PHYSICIAN:  R. Roetta Sessions, M.D. DATE OF BIRTH:  [birth year]  DATE OF PROCEDURE:  03/10/2010 DATE OF DISCHARGE:                              OPERATIVE REPORT   PROCEDURE:  Screening colonoscopy.  INDICATIONS FOR PROCEDURE:  [name] sent over at courtesy of Dr. Carylon Perches, for colorectal cancer screening.  She reports no lower GI tract symptoms.  No family history of polyps or colon cancer.  Her last colonoscopy was reported 11-12 years ago by, Dr. Erskine Speed of [place], [place].  Colonoscopy is now being done as a standard screening maneuver.  Risks, benefits, limitations, alternatives, imponderables have been reviewed, questions answered.  Please see the documentation in the medical record.  PROCEDURE NOTE:  O2 saturation, blood pressure, pulse, respirations were monitored throughout the entire procedure.  CONSCIOUS SEDATION:  Versed 4 mg IV, Demerol 75 mg IV in divided doses.  INSTRUMENT:  Pentax video chip system.  FINDINGS:  Digital rectal exam revealed no abnormalities.  Endoscopic findings:  Prep was adequate.  Colon:  Colonic mucosa was surveyed from the rectosigmoid junction through the left transverse right colon to the appendiceal orifice, ileocecal valve/cecum.  These structures were well seen and photographed for the record.  From this level, the scope was slowly and cautiously withdrawn.  All previously mentioned mucosal surfaces were again seen.  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  Otherwise, colonic mucosa appeared entirely normal.  Scope was pulled down to the rectum where a thorough examination of rectal mucosa including retroflexed view of the anal verge demonstrated no abnormalities.  The patient tolerated  the procedure well.  Cecal withdrawal time 9 minutes.  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  RECOMMENDATIONS:  Repeat screening colonoscopy, 10 years.     Jonathon Bellows, M.D.     RMR/MEDQ  D:  03/10/2010  T:  03/10/2010  Job:  [id number]  cc:   Kingsley Callander. [name], MD Fax: [phone]  Electronically Signed by Lorrin Goodell M.D. on 03/21/2010 02:41:23 PM Electronically Signed by Lorrin Goodell M.D. on 03/21/2010 03:21:58 PM Electronically Signed by Lorrin Goodell M.D. on 03/21/2010 03:46:14 PM Electronically Signed by Lorrin Goodell M.D. on 03/21/2010 04:23:57 PM Electronically Signed by Lorrin Goodell M.D. on 03/21/2010 04:55:16 PM Electronically Signed by Lorrin Goodell M.D. on 03/21/2010 07:36:57 PM

## 2010-03-27 LAB — PROTEIN / CREATININE RATIO, URINE: Total Protein, Urine: 136 mg/dL

## 2010-03-27 LAB — RENAL FUNCTION PANEL
BUN: 89 mg/dL — ABNORMAL HIGH (ref 6–23)
Calcium: 9 mg/dL (ref 8.4–10.5)
Chloride: 88 mEq/L — ABNORMAL LOW (ref 96–112)
GFR calc Af Amer: 12 mL/min — ABNORMAL LOW (ref >60)
Glucose, Bld: 97 mg/dL (ref 70–99)
Potassium: 3.6 mEq/L (ref 3.5–5.1)
Sodium: 121 mEq/L — ABNORMAL LOW (ref 135–145)

## 2010-03-27 LAB — HEMOGLOBIN AND HEMATOCRIT, BLOOD
HCT: 33.6 % — ABNORMAL LOW (ref 36.0–46.0)
Hemoglobin: 11.7 g/dL — ABNORMAL LOW (ref 12.0–15.0)

## 2010-03-28 LAB — FERRITIN: Ferritin: 439 ng/mL — ABNORMAL HIGH (ref 10–291)

## 2010-03-28 LAB — CBC
MCH: 27.4 pg (ref 26.0–34.0)
MCHC: 32.8 g/dL (ref 30.0–36.0)
Platelets: 239 10*3/uL (ref 150–400)
RBC: 4.17 MIL/uL (ref 3.87–5.11)
WBC: 6.6 10*3/uL (ref 4.0–10.5)

## 2010-03-30 LAB — RENAL FUNCTION PANEL
Albumin: 3.6 g/dL (ref 3.5–5.2)
Creatinine, Ser: 4.21 mg/dL — ABNORMAL HIGH (ref 0.4–1.2)
GFR calc Af Amer: 13 mL/min — ABNORMAL LOW (ref >60)
GFR calc non Af Amer: 11 mL/min — ABNORMAL LOW (ref >60)
Glucose, Bld: 85 mg/dL (ref 70–99)
Sodium: 131 mEq/L — ABNORMAL LOW (ref 135–145)

## 2010-03-30 LAB — CBC
HCT: 29 % — ABNORMAL LOW (ref 36.0–46.0)
MCH: 26.9 pg (ref 26.0–34.0)
MCHC: 32.5 g/dL (ref 30.0–36.0)
MCV: 82.8 fL (ref 78.0–100.0)
Platelets: 237 10*3/uL (ref 150–400)

## 2010-03-30 LAB — PROTEIN / CREATININE RATIO, URINE
Protein Creatinine Ratio: 3.91 — ABNORMAL HIGH (ref 0.00–0.15)
Total Protein, Urine: 189 mg/dL

## 2010-03-30 LAB — RETICULOCYTES: Retic Count, Absolute: 7 10*3/uL — ABNORMAL LOW (ref 19.0–186.0)

## 2010-03-31 ENCOUNTER — Ambulatory Visit (HOSPITAL_COMMUNITY): Payer: No Typology Code available for payment source

## 2010-03-31 DIAGNOSIS — N289 Disorder of kidney and ureter, unspecified: Secondary | ICD-10-CM

## 2010-03-31 DIAGNOSIS — M810 Age-related osteoporosis without current pathological fracture: Secondary | ICD-10-CM

## 2010-03-31 DIAGNOSIS — D638 Anemia in other chronic diseases classified elsewhere: Secondary | ICD-10-CM

## 2010-03-31 LAB — FERRITIN: Ferritin: 226 ng/mL (ref 10–291)

## 2010-03-31 LAB — CBC
HCT: 38 % (ref 36.0–46.0)
MCV: 84 fL (ref 78.0–100.0)
RBC: 4.52 MIL/uL (ref 3.87–5.11)
RDW: 19.2 % — ABNORMAL HIGH (ref 11.5–15.5)
WBC: 8.1 10*3/uL (ref 4.0–10.5)

## 2010-04-02 LAB — CBC
MCHC: 32.9 g/dL (ref 30.0–36.0)
MCV: 81.5 fL (ref 78.0–100.0)
Platelets: 235 10*3/uL (ref 150–400)
Platelets: 264 10*3/uL (ref 150–400)
RBC: 3.54 MIL/uL — ABNORMAL LOW (ref 3.87–5.11)
RBC: 3.63 MIL/uL — ABNORMAL LOW (ref 3.87–5.11)
WBC: 9 10*3/uL (ref 4.0–10.5)
WBC: 6 10*3/uL (ref 4.0–10.5)

## 2010-04-03 LAB — CBC
HCT: 30.5 % — ABNORMAL LOW (ref 36.0–46.0)
Hemoglobin: 10 g/dL — ABNORMAL LOW (ref 12.0–15.0)
MCHC: 32.7 g/dL (ref 30.0–36.0)
Platelets: 300 10*3/uL (ref 150–400)

## 2010-04-03 LAB — DIFFERENTIAL
Basophils Absolute: 0 10*3/uL (ref 0.0–0.1)
Basophils Relative: 1 % (ref 0–1)
Lymphocytes Relative: 26 % (ref 12–46)
Lymphs Abs: 2 10*3/uL (ref 0.7–4.0)
Monocytes Relative: 7 % (ref 3–12)

## 2010-04-05 LAB — CBC
HCT: 24.2 % — ABNORMAL LOW (ref 36.0–46.0)
Hemoglobin: 8.1 g/dL — ABNORMAL LOW (ref 12.0–15.0)
MCV: 81.9 fL (ref 78.0–100.0)
RBC: 2.96 MIL/uL — ABNORMAL LOW (ref 3.87–5.11)
WBC: 7.6 10*3/uL (ref 4.0–10.5)

## 2010-04-06 ENCOUNTER — Encounter (INDEPENDENT_AMBULATORY_CARE_PROVIDER_SITE_OTHER): Payer: No Typology Code available for payment source | Admitting: Psychiatry

## 2010-04-06 DIAGNOSIS — F200 Paranoid schizophrenia: Secondary | ICD-10-CM

## 2010-04-09 LAB — HEMOCCULT GUIAC POC 1CARD (OFFICE)
Fecal Occult Bld: NEGATIVE
Fecal Occult Bld: NEGATIVE
Fecal Occult Bld: NEGATIVE
Fecal Occult Bld: NEGATIVE

## 2010-04-09 LAB — COMPREHENSIVE METABOLIC PANEL
AST: 21 U/L (ref 0–37)
Albumin: 3.7 g/dL (ref 3.5–5.2)
Chloride: 98 mEq/L (ref 96–112)
Creatinine, Ser: 3.24 mg/dL — ABNORMAL HIGH (ref 0.4–1.2)
GFR calc Af Amer: 17 mL/min — ABNORMAL LOW (ref >60)
Potassium: 3.6 mEq/L (ref 3.5–5.1)
Total Bilirubin: 0.5 mg/dL (ref 0.3–1.2)
Total Protein: 7.4 g/dL (ref 6.0–8.3)

## 2010-04-09 LAB — DIFFERENTIAL
Basophils Absolute: 0 10*3/uL (ref 0.0–0.1)
Eosinophils Relative: 2 % (ref 0–5)
Lymphocytes Relative: 21 % (ref 12–46)
Monocytes Absolute: 0.4 10*3/uL (ref 0.1–1.0)
Monocytes Relative: 7 % (ref 3–12)

## 2010-04-09 LAB — CBC
MCV: 80.9 fL (ref 78.0–100.0)
Platelets: 267 10*3/uL (ref 150–400)
WBC: 5.8 10*3/uL (ref 4.0–10.5)

## 2010-04-10 ENCOUNTER — Other Ambulatory Visit: Payer: Self-pay | Admitting: Neurology

## 2010-04-10 DIAGNOSIS — R29898 Other symptoms and signs involving the musculoskeletal system: Secondary | ICD-10-CM

## 2010-04-10 DIAGNOSIS — R269 Unspecified abnormalities of gait and mobility: Secondary | ICD-10-CM

## 2010-04-17 LAB — CBC
HCT: 27.4 % — ABNORMAL LOW (ref 36.0–46.0)
MCHC: 33 g/dL (ref 30.0–36.0)
MCV: 83.8 fL (ref 78.0–100.0)
Platelets: 218 10*3/uL (ref 150–400)
RDW: 17.1 % — ABNORMAL HIGH (ref 11.5–15.5)

## 2010-04-19 LAB — CBC
Hemoglobin: 9.9 g/dL — ABNORMAL LOW (ref 12.0–15.0)
Platelets: 294 10*3/uL (ref 150–400)
RDW: 18.4 % — ABNORMAL HIGH (ref 11.5–15.5)

## 2010-04-20 LAB — CBC
Hemoglobin: 9.5 g/dL — ABNORMAL LOW (ref 12.0–15.0)
RBC: 3.48 MIL/uL — ABNORMAL LOW (ref 3.87–5.11)
WBC: 6.1 10*3/uL (ref 4.0–10.5)

## 2010-04-21 ENCOUNTER — Ambulatory Visit (HOSPITAL_COMMUNITY)
Admission: RE | Admit: 2010-04-21 | Discharge: 2010-04-21 | Disposition: A | Discharge status: Home / Self Care | Payer: No Typology Code available for payment source | Source: Ambulatory Visit | Attending: Neurology | Admitting: Neurology

## 2010-04-21 DIAGNOSIS — I6789 Other cerebrovascular disease: Secondary | ICD-10-CM | POA: Insufficient documentation

## 2010-04-21 DIAGNOSIS — M6281 Muscle weakness (generalized): Secondary | ICD-10-CM | POA: Insufficient documentation

## 2010-04-21 DIAGNOSIS — R269 Unspecified abnormalities of gait and mobility: Secondary | ICD-10-CM | POA: Insufficient documentation

## 2010-04-21 DIAGNOSIS — R29898 Other symptoms and signs involving the musculoskeletal system: Secondary | ICD-10-CM

## 2010-04-21 DIAGNOSIS — R42 Dizziness and giddiness: Secondary | ICD-10-CM | POA: Insufficient documentation

## 2010-04-21 LAB — CBC
HCT: 32.3 % — ABNORMAL LOW (ref 36.0–46.0)
Hemoglobin: 10.8 g/dL — ABNORMAL LOW (ref 12.0–15.0)
MCHC: 33.4 g/dL (ref 30.0–36.0)
MCV: 80.9 fL (ref 78.0–100.0)
RBC: 3.99 MIL/uL (ref 3.87–5.11)
WBC: 10.4 10*3/uL (ref 4.0–10.5)

## 2010-04-22 LAB — CBC
HCT: 33.6 % — ABNORMAL LOW (ref 36.0–46.0)
MCHC: 33.9 g/dL (ref 30.0–36.0)
MCV: 80.3 fL (ref 78.0–100.0)
Platelets: 278 10*3/uL (ref 150–400)

## 2010-04-23 LAB — CBC
Platelets: 270 10*3/uL (ref 150–400)
RDW: 18.3 % — ABNORMAL HIGH (ref 11.5–15.5)
WBC: 7.6 10*3/uL (ref 4.0–10.5)

## 2010-04-24 LAB — DIFFERENTIAL
Basophils Absolute: 0 10*3/uL (ref 0.0–0.1)
Eosinophils Relative: 2 % (ref 0–5)
Lymphocytes Relative: 18 % (ref 12–46)
Lymphs Abs: 1.3 10*3/uL (ref 0.7–4.0)
Neutro Abs: 5.3 10*3/uL (ref 1.7–7.7)

## 2010-04-24 LAB — CBC
HCT: 37.7 % (ref 36.0–46.0)
HCT: 35.1 % — ABNORMAL LOW (ref 36.0–46.0)
Hemoglobin: 12.5 g/dL (ref 12.0–15.0)
Hemoglobin: 11.9 g/dL — ABNORMAL LOW (ref 12.0–15.0)
MCV: 81.2 fL (ref 78.0–100.0)
Platelets: 268 10*3/uL (ref 150–400)
Platelets: 294 10*3/uL (ref 150–400)
RDW: 18.4 % — ABNORMAL HIGH (ref 11.5–15.5)
WBC: 7.1 10*3/uL (ref 4.0–10.5)
WBC: 6.6 10*3/uL (ref 4.0–10.5)

## 2010-04-25 LAB — CBC
HCT: 34.6 % — ABNORMAL LOW (ref 36.0–46.0)
Hemoglobin: 11.7 g/dL — ABNORMAL LOW (ref 12.0–15.0)
MCHC: 33.8 g/dL (ref 30.0–36.0)
MCV: 81.4 fL (ref 78.0–100.0)
Platelets: 289 10*3/uL (ref 150–400)
RDW: 18.5 % — ABNORMAL HIGH (ref 11.5–15.5)

## 2010-04-26 LAB — CBC
MCHC: 32.7 g/dL (ref 30.0–36.0)
MCV: 82.3 fL (ref 78.0–100.0)
Platelets: 242 10*3/uL (ref 150–400)
RDW: 18.9 % — ABNORMAL HIGH (ref 11.5–15.5)

## 2010-04-27 LAB — CBC
HCT: 33.6 % — ABNORMAL LOW (ref 36.0–46.0)
Hemoglobin: 11.2 g/dL — ABNORMAL LOW (ref 12.0–15.0)
MCV: 81.6 fL (ref 78.0–100.0)
MCV: 80.5 fL (ref 78.0–100.0)
Platelets: 313 10*3/uL (ref 150–400)
Platelets: 296 10*3/uL (ref 150–400)
RBC: 4.4 MIL/uL (ref 3.87–5.11)
RBC: 4.17 MIL/uL (ref 3.87–5.11)
WBC: 8.6 10*3/uL (ref 4.0–10.5)
WBC: 8.2 10*3/uL (ref 4.0–10.5)

## 2010-05-01 LAB — CBC
HCT: 35.1 % — ABNORMAL LOW (ref 36.0–46.0)
Hemoglobin: 11.1 g/dL — ABNORMAL LOW (ref 12.0–15.0)
Hemoglobin: 11.3 g/dL — ABNORMAL LOW (ref 12.0–15.0)
MCHC: 32.3 g/dL (ref 30.0–36.0)
MCV: 81.2 fL (ref 78.0–100.0)
Platelets: 273 10*3/uL (ref 150–400)
RDW: 18.2 % — ABNORMAL HIGH (ref 11.5–15.5)
RDW: 19 % — ABNORMAL HIGH (ref 11.5–15.5)

## 2010-05-02 LAB — CBC
MCHC: 32.3 g/dL (ref 30.0–36.0)
RBC: 4.04 MIL/uL (ref 3.87–5.11)
RDW: 19.7 % — ABNORMAL HIGH (ref 11.5–15.5)

## 2010-05-04 ENCOUNTER — Encounter (HOSPITAL_COMMUNITY): Payer: No Typology Code available for payment source | Attending: Oncology

## 2010-05-04 DIAGNOSIS — N189 Chronic kidney disease, unspecified: Secondary | ICD-10-CM

## 2010-05-04 DIAGNOSIS — E119 Type 2 diabetes mellitus without complications: Secondary | ICD-10-CM | POA: Insufficient documentation

## 2010-05-04 DIAGNOSIS — N039 Chronic nephritic syndrome with unspecified morphologic changes: Secondary | ICD-10-CM | POA: Insufficient documentation

## 2010-05-04 DIAGNOSIS — D631 Anemia in chronic kidney disease: Secondary | ICD-10-CM | POA: Insufficient documentation

## 2010-05-04 DIAGNOSIS — Z79899 Other long term (current) drug therapy: Secondary | ICD-10-CM | POA: Insufficient documentation

## 2010-05-04 DIAGNOSIS — N049 Nephrotic syndrome with unspecified morphologic changes: Secondary | ICD-10-CM | POA: Insufficient documentation

## 2010-05-04 DIAGNOSIS — D638 Anemia in other chronic diseases classified elsewhere: Secondary | ICD-10-CM

## 2010-05-05 ENCOUNTER — Ambulatory Visit (HOSPITAL_COMMUNITY): Payer: No Typology Code available for payment source

## 2010-05-30 NOTE — Consult Note (Signed)
[name], [name]               ACCOUNT NO.:  [id number]   MEDICAL RECORD NO.:  [id number]          PATIENT TYPE:  AMB   LOCATION:  DAY                           FACILITY:  [facility]   PHYSICIAN:  Kassie Mends, M.D.      DATE OF BIRTH:  [birth year]   DATE OF CONSULTATION:  06/02/2007  DATE OF DISCHARGE:                                 CONSULTATION   PROBLEM LIST:  1. History of Schatzki's ring with esophageal dilation in April 2005.  2. Hypertension.  3. Depression.  4. Arthritis.  5. Anemia.  6. Foot spasm requiring muscle relaxants.  7. Asthma.   SUBJECTIVE:  [name] is a 70 year old female who was last seen in the  clinic in December 2006.  She was being seen for gastroesophageal reflux  disease and intermittent dysphagia.  She was prescribed Prilosec 20 mg  twice a day.  Now, she is on Nexium once a day.  She states, when she  eats, she gets choked, especially with steaks, hamburger, and meatloaf.  Choking causes her to have coughing.  Sometimes she urinates on herself.  Her symptoms have been worse over the last year.  She denies any  Aspirin, B.C. or Goody's Powder use.  She is on no blood thinner.  She  has had no vomiting.  She has had no abdominal pain.  She is currently  on a diet for weight loss.   ALLERGIES:  No known drug allergies.   MEDICATIONS:  1. Caduet 10/40 daily.  2. Hydrocodone/APAP 7.5/500.  3. Diovan/hydrochlorothiazide.  4. Lexapro.  5. Chromagen Monday, Wednesday, Friday.  6. Hyoscyamine as needed.  7. Vesicare daily.  8. Klor-Con daily.  9. Gabapentin 100 mg t.i.d.  10.Labetalol 100 mg b.i.d.  11.Nexium 40 mg daily.  12.Invega 6 mg daily.  13.Alprazolam 1 mg b.i.d.  14.Cyclobenzaprine 10 mg b.i.d.  15.ProAir b.i.d.  16.Calcium with vitamin D.  17.Robitussin.   FAMILY HISTORY:  She has no family history of colon cancer or colon  polyps.   SOCIAL HISTORY:  She is married and unemployed.  She stopped smoking in  2000.  She does not drink  any alcohol.   OBJECTIVE/PHYSICAL EXAM:  Weight 202 pounds (unchanged since 2006),  height 5 feet, temperature 97.8, blood pressure 109/80, pulse 80.  GENERAL:  She is in no apparent distress.  Alert and oriented x4.  HEENT:  Normocephalic, atraumatic.  Pupils equal, round, and reactive to  light.  Mouth with no oral lesions.  Posterior oropharynx without  erythema or exudate.  NECK:  Full range of motion with no  lymphadenopathy.  LUNGS:  Clear to auscultation bilaterally.  CARDIOVASCULAR:  Regular rhythm with no murmur.  ABDOMEN:  Bowel sounds  are present and soft.  Nontender.  Nondistended.  No rebound or  guarding.   ASSESSMENT:  [name] is a 70 year old female who has a history of  esophageal dilation due to a Schatzki's ring and who presents with solid  dysphagia.  Thank you for allowing me to see [name] in consultation.  My recommendations follow.   RECOMMENDATIONS:  1.  She will be scheduled for an EGD with dilation on Wednesday, May      20th.  2. She is instructed to eat meats that are chopped, pulled, or ground.      She is to eat no raw vegetables.  3. Screening colonoscopy in 2011.  4. She has a follow up appointment to see me in 2 months.      Kassie Mends, M.D.  Electronically Signed     SM/MEDQ  D:  06/02/2007  T:  06/02/2007  Job:  [id number]   cc:   Kingsley Callander. [name], MD  Fax: [phone]

## 2010-05-31 ENCOUNTER — Encounter (HOSPITAL_COMMUNITY): Payer: No Typology Code available for payment source

## 2010-05-31 ENCOUNTER — Encounter (HOSPITAL_COMMUNITY): Payer: No Typology Code available for payment source | Attending: Oncology | Admitting: Oncology

## 2010-05-31 ENCOUNTER — Other Ambulatory Visit (HOSPITAL_COMMUNITY): Payer: Self-pay | Admitting: Oncology

## 2010-05-31 DIAGNOSIS — D631 Anemia in chronic kidney disease: Secondary | ICD-10-CM | POA: Insufficient documentation

## 2010-05-31 DIAGNOSIS — N049 Nephrotic syndrome with unspecified morphologic changes: Secondary | ICD-10-CM | POA: Insufficient documentation

## 2010-05-31 DIAGNOSIS — E119 Type 2 diabetes mellitus without complications: Secondary | ICD-10-CM | POA: Insufficient documentation

## 2010-05-31 DIAGNOSIS — N039 Chronic nephritic syndrome with unspecified morphologic changes: Secondary | ICD-10-CM | POA: Insufficient documentation

## 2010-05-31 DIAGNOSIS — Z79899 Other long term (current) drug therapy: Secondary | ICD-10-CM | POA: Insufficient documentation

## 2010-05-31 DIAGNOSIS — D638 Anemia in other chronic diseases classified elsewhere: Secondary | ICD-10-CM

## 2010-05-31 DIAGNOSIS — N189 Chronic kidney disease, unspecified: Secondary | ICD-10-CM

## 2010-05-31 LAB — CBC
HCT: 31.4 % — ABNORMAL LOW (ref 36.0–46.0)
MCH: 27.2 pg (ref 26.0–34.0)
MCV: 83.1 fL (ref 78.0–100.0)
Platelets: 222 10*3/uL (ref 150–400)
RBC: 3.78 MIL/uL — ABNORMAL LOW (ref 3.87–5.11)
RDW: 15.7 % — ABNORMAL HIGH (ref 11.5–15.5)
WBC: 6.9 10*3/uL (ref 4.0–10.5)

## 2010-06-01 ENCOUNTER — Ambulatory Visit (HOSPITAL_COMMUNITY): Payer: No Typology Code available for payment source | Admitting: Oncology

## 2010-06-01 ENCOUNTER — Encounter (INDEPENDENT_AMBULATORY_CARE_PROVIDER_SITE_OTHER): Payer: No Typology Code available for payment source | Admitting: Psychiatry

## 2010-06-01 ENCOUNTER — Ambulatory Visit (HOSPITAL_COMMUNITY): Payer: No Typology Code available for payment source

## 2010-06-01 DIAGNOSIS — F200 Paranoid schizophrenia: Secondary | ICD-10-CM

## 2010-06-02 NOTE — Discharge Summary (Signed)
NAME:  TROY, HARTZOG                         ACCOUNT NO.:  [id number]   MEDICAL RECORD NO.:  [id number]                   PATIENT TYPE:  OBV   LOCATION:  A208                                 FACILITY:  [facility]   PHYSICIAN:  Kingsley Callander. [name], M.D.                  DATE OF BIRTH:  04-10-1940   DATE OF ADMISSION:  10/20/2001  DATE OF DISCHARGE:  10/21/2001                                 DISCHARGE SUMMARY   DISCHARGE DIAGNOSES:  1. Chest pain (negative adenosine Cardiolite study).  2. Anemia of chronic disease.  3. Hypertension.  4. Chronic renal insufficiency.  5. Schizophrenia.  6. Hyperlipidemia.  7. Osteoarthritis.  8. Gastroesophageal reflux disease.   HOSPITAL COURSE:  This patient is a 70 year old female who presented  to the  emergency room with left anterior chest pain radiating into her shoulder,  arm, and neck.  Her initial EKG revealed normal sinus rhythm and nonspecific  anteroseptal T-wave changes.  She was hospitalized in a monitored setting on  2A.  Serial cardiac enzymes were normal.  She was seen in consultation by  cardiology.  She underwent an adenosine Cardiolite stress test which  revealed no ischemia.   Her routine medications were continued.  Her hemoglobin was 11.6 with an MCV  of 82.  Her BUN and creatinine were improved to 17 and 1.7.  Her urinalysis  revealed no protein.  She has a history of microalbuminuria and is presently  on an ARB in the form of Diovan.  She had 3-6 white cells in the urine, but  was asymptomatic.   Her chest pain was resolved and she was felt to be stable for discharge on  October 7.  She will follow up in the office as scheduled.   DISCHARGE MEDICATIONS:  1. Norvasc 10 mg q.d.  2. Diovan HCT 160/25 q.d.  3. Atenolol 50 mg q.d.  4. Lipitor 40 mg q.d.  5. Tramadol 50 mg b.i.d.  6. Hydrocodone 5 mg q.6h. p.r.n.  7. Nexium 40 mg q.d.  8. Calcium with vitamin D b.i.d.  9.     Alprazolam 0.5 mg t.i.d.  10.      Seroquel 100 mg  t.i.d.  11.      Topamax 25 mg t.i.d.                                                 Kingsley Callander. [name], M.D.    ROF/MEDQ  D:  10/22/2001  T:  10/23/2001  Job:  [id number]   cc:   Scotland Bing, M.D. [facility]  [address]  [place]  [place] [address]  Fax: [phone]   Ladona Horns. Neijstrom, M.D.  [address]  [place]  [place] [address]  Fax: [phone]

## 2010-06-02 NOTE — Group Therapy Note (Signed)
   NAME:  [name], [name]                         ACCOUNT NO.:  [id number]   MEDICAL RECORD NO.:  [id number]                   PATIENT TYPE:  OBV   LOCATION:  A208                                 FACILITY:  [facility]   PHYSICIAN:  Edward L. Juanetta Gosling, M.D.             DATE OF BIRTH:  Dec 04, 1940   DATE OF PROCEDURE:  DATE OF DISCHARGE:  10/21/2001                                   PROGRESS NOTE   PROGRESS NOTE:  There is a sinus rhythm rate in the 90's.  There are  nonspecific ST-T wave changes diffusely.  Minimal abnormal  electrocardiogram.                                               Edward L. Juanetta Gosling, M.D.    ELH/MEDQ  D:  10/20/2001  T:  10/22/2001  Job:  [id number]

## 2010-06-02 NOTE — Consult Note (Signed)
NAME:  [name], [name]                         ACCOUNT NO.:  [id number]   MEDICAL RECORD NO.:  [id number]                   PATIENT TYPE:  OUT   LOCATION:  RAD                                  FACILITY:  [facility]   PHYSICIAN:  R. Roetta Sessions, M.D.              DATE OF BIRTH:  April 04, 1940   DATE OF CONSULTATION:  05/03/2003  DATE OF DISCHARGE:  03/05/2003                                   CONSULTATION   CONSULTING PHYSICIAN:  R. Roetta Sessions, M.D.   REFERRING PHYSICIAN:  Kingsley Callander. [name], M.D.   REASON FOR CONSULTATION:  Abdominal pain and reflux.   HISTORY OF PRESENT ILLNESS:  The patient is a pleasant 70 year old African-  American female kindly sent over courtesy of Dr. [name] to further evaluate  abdominal pain.  The patient describes to me today generalized upper  abdominal pain and sour burning material coming up into her throat which she  describes as reflux.  She has been on Prilosec 20 mg orally daily and was  previously on Nexium.  She describes intermittent esophageal dysphagia and  she has to take Mylanta on a regular basis to squelch her reflux symptoms.  The upper abdominal pain comes and goes not necessarily related to meals or  having a bowel movement.  She denies melena or rectal bleeding.  She has  lost 40 pounds recently in an attempt to become more healthy.  An abdominal  ultrasound back on March 05, 2003 demonstrated a normal liver, the  gallbladder appeared normal and small cysts in the left kidney.  According  to Dr. Alonza Smoker records, she was complaining of more localized right upper  quadrant pain back in February.  She does not use non-steroidals.  There is  no history of peptic ulcer disease.  Weight loss has not been associated  with any change in her abdominal symptoms.   PAST MEDICAL HISTORY:  1. Hypertension.  2. Anemia.  3. Arthritis.  4. Anxiety neurosis.  5. Depression.  6. Paranoid schizophrenia.   PAST SURGICAL HISTORY:  1. Tubal ligation.  2.  Neck surgery.  3. Right leg surgery.  4. Colonoscopy report by Dr. Karilyn Cota a few years ago with negative findings     at [facility] and those records are not readily available.   CURRENT MEDICATIONS:  1. Chromagen on Monday, Wednesday and Friday.  2. Lexapro 10 mg daily.  3. Calcium with vitamin D supplement.  4. Skelaxin 800 mg t.i.d.  5. Lortab 7.5/500 mg p.r.n.  6. Diovan HCTZ 160/25 mg daily.  7. Caduet 10/40 mg once daily.  8. Tramadol HCL 50 mg b.i.d.  9. Xanax 1 mg t.i.d.  10.      Topamax 25 mg three tablets daily.  11.      Seroquel 100 mg four times daily.  12.      Atenolol 50 mg daily.  13.      Prilosec  20 mg daily.  14.      Stool softener daily.  15.      Levsin sublingual t.i.d.   ALLERGIES:  No known drug allergies.   FAMILY HISTORY:  Father had cancer behind his heart.  Mother died with a  MI and Alzheimer's disease.  No history of chronic GI or liver illnesses.   SOCIAL HISTORY:  The patient has been married for 36 years and has three  children and three grandchildren.  She is disabled.  No tobacco and no  alcohol.   REVIEW OF SYMPTOMS:  As in history of present illness.   PHYSICAL EXAMINATION:  GENERAL:  Pleasant 70 year old lady who is resting  comfortably.  VITAL SIGNS:  Weight is 183.5, height 5'4, temperature 97.6, blood pressure  100/60, pulse 72.  SKIN:  Warm and dry.  HEENT EXAM:  No scleral icterus.  Conjunctivae are slightly pale.  Oral  cavity:  No lesions.  JVD is not prominent.  CHEST:  Lungs are clear to auscultation.  CARDIAC:  Regular rate and rhythm without murmurs, rubs or gallops.  ABDOMEN:  Obese, positive bowel sounds, soft and non-tender without an  obvious mass or organomegaly.   IMPRESSION:  The patient is a 70 year old lady with at least a several month  history of rather diffuse upper abdominal pain and reflux symptoms  refractory to Prilosec requiring OTC antacids on a regular basis.  She also  describes intermittent  esophageal dysphagia.  She really is not complaining  of any localized right upper quadrant discomfort at this time. Her symptoms  are nonspecific although she does give a prominent history consistent with  gastroesophageal reflux disease.  She has dysphagia.  We would like to  review the colonoscopy report done by Dr. Karilyn Cota reportedly which is not  readily available at this time.   RECOMMENDATIONS:  She really ought to just go ahead and have an upper  endoscopy to further evaluate her symptoms initially.  This approach has  been discussed with the patient and the potential risks, benefits and  alternatives have been reviewed. We will make further recommendations once  upper gastrointestinal endoscopy has been completed.   I would like to thank Dr. Carylon Perches for allowing me to assist in this nice  lady today.      ___________________________________________                                            [name], M.D.   RMR/MEDQ  D:  05/03/2003  T:  05/03/2003  Job:  [id number]   cc:   Kingsley Callander. [name], M.D.  [address]  [place]  [place] [address]  Fax: [phone]

## 2010-06-02 NOTE — H&P (Signed)
NAME:  [name], [name]                         ACCOUNT NO.:  [id number]   MEDICAL RECORD NO.:  [id number]                   PATIENT TYPE:  OBV   LOCATION:  A208                                 FACILITY:  [facility]   PHYSICIAN:  Kingsley Callander. [name], M.D.                  DATE OF BIRTH:  [birth year]   DATE OF ADMISSION:  10/20/2001  DATE OF DISCHARGE:                                HISTORY & PHYSICAL   CHIEF COMPLAINT:  Chest pain.   HISTORY OF PRESENT ILLNESS:  This patient is a 70 year old female who  presented to the emergency room with substernal chest pain radiating into  her back, left shoulder, and left arm.  She also experienced some numbness  in the left arm.  There was no diaphoresis, vomiting, or syncope.  She had  experienced pain the day prior to admission and awakened with pain on the  day of admission.  She was evaluated initially in the emergency room and  received one sublingual nitroglycerin.  Her pain was relieved.  There were  nonspecific anteroseptal T-wave changes present on her electrocardiogram.  She has a history of impaired fasting glucose and hypertension.  Her mother  had suffered an MI at an early age.   PAST MEDICAL HISTORY:  1. Anemia.  2. Hypertension.  3. Chronic renal insufficiency.  4. Schizophrenia.  5. Hyperlipidemia.  6. Osteoarthritis.  7. GERD.  8. Migraine headaches.  9. Tubal ligation.  10.      Right retinal hemorrhage.   MEDICATIONS:  1. Norvasc 10 mg q.d.  2. Diovan/HCT 160/25 q.d.  3. Atenolol 50 mg q.d.  4. Lipitor 40 mg q.d.  5. Tramadol 50 mg b.i.d.  6. Hydrocodone 5 mg approximately once a day.  7. Nexium 40 mg q.d.  8. Calcium with vitamin D b.i.d.  9. Alprazolam 0.5 mg t.i.d.  10.      Seroquel 100 mg t.i.d.  11.      Topamax 25 mg t.i.d.   ALLERGIES:  None.   SOCIAL HISTORY:  She quit smoking over three years ago.  She does not use  alcohol.   FAMILY HISTORY:  Her mother died of pneumonia at 38.  Her father died of  stomach cancer at 58.  A brother is deceased from lung cancer.  One brother  has had a stroke, hypertension, and diabetes.  One brother has had  depression.   REVIEW OF SYSTEMS:  No cough, fever, or difficulty breathing.   PHYSICAL EXAMINATION:  VITAL SIGNS:  Temperature 97.6, pulse 75,  respirations 20, blood pressure 159/95.  GENERAL:  Alert African American female.  HEENT:  No scleral icterus.  Pharynx unremarkable.  NECK:  No JVD or thyromegaly.  LUNGS:  Clear.  HEART:  Regular with no murmurs.  ABDOMEN:  Nontender.  No hepatosplenomegaly.  EXTREMITIES:  No cyanosis, clubbing, or edema.  Pulses are normal.  NEUROLOGIC:  Grossly intact.   LABORATORY DATA:  White count 7.6, hemoglobin 11.6, platelets 307.  Sodium  138, potassium 3.5, glucose 113, creatinine 1.7.  CPK 91, troponin 0.03.   EKG reveals normal sinus rhythm with nonspecific anteroseptal T-wave  changes.  A head CT reveals no acute change.  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   IMPRESSION:  1. Chest pain.  She has nonspecific EKG changes and normal initial cardiac     enzymes.  Her risk factors include hypertension, impaired fasting     glucose, hyperlipidemia, previous tobacco use, and a positive family     history.  She is being hospitalized in a monitored setting for serial     cardiac enzymes and cardiology consultation.  We will continue her beta-     blocker, aspirin, and statin.  Nitroglycerin and Lovenox will be added if     she has additional pain.  2. Hypertension.  Continue Norvasc, atenolol, and Diovan/HCT.  3. Hyperlipidemia.  Continue Lipitor.  4. Impaired fasting glucose.  Continue observation.  5. Chronic renal insufficiency, improved with a drop in her creatinine from     2.1 recently now to 1.7.  She does have microalbuminuria with a prior     level of 6.36.  6. Schizophrenia, stable.                                               Kingsley Callander. [name],  M.D.    ROF/MEDQ  D:  10/21/2001  T:  10/21/2001  Job:  [id number]

## 2010-06-02 NOTE — Op Note (Signed)
NAME:  [name], [name]                         ACCOUNT NO.:  [id number]   MEDICAL RECORD NO.:  [id number]                   PATIENT TYPE:  AMB   LOCATION:  DAY                                  FACILITY:  [facility]   PHYSICIAN:  R. Roetta Sessions, M.D.              DATE OF BIRTH:  04/17/1940   DATE OF PROCEDURE:  05/06/2003  DATE OF DISCHARGE:                                 OPERATIVE REPORT   PROCEDURE:  Esophagogastroduodenoscopy with Elease Hashimoto dilation.   INDICATIONS FOR PROCEDURE:  The patient is a 70 year old lady with a several-  month history of rather diffuse upper abdominal pain and reflux symptoms at  least in part refractory to Prilosec requiring dosing with antacids  frequently, and intermittent esophageal dysphagia.  EGD is now being done.  This approach has been discussed with the patient at length.  The potential  risks, benefits, and alternatives have been reviewed and questions answered.  Please see my dictated consultation note for more information.   PROCEDURE:  O2 saturation, blood pressure, pulses, and respirations were  monitored throughout the entire procedure.  Conscious sedation was with  Versed 4 mg IV, Demerol 50 mg IV in divided doses, Cetacaine spray for  topical oropharyngeal anesthesia.  The instrument used was the Olympus GIF-  Q140 gastroscope.   FINDINGS:  Examination of the tubular esophagus revealed normal esophageal  mucosa, except for a ring at the EG junction.  The EG junction was easily  traversed.   Stomach:  The gastric cavity was empty and insufflated well with air.  Thorough examination of the gastric mucosa including retroflex view of the  proximal stomach and esophagogastric junction demonstrated again a ring  actually seen better retroflexed.  The remainder of the gastric mucosa  appeared normal.  The pylorus was patent and easily traversed.   Duodenum:  Examination of the bulb and second portion revealed no  abnormalities.   THERAPEUTIC/DIAGNOSTIC MANEUVERS PERFORMED:  A 56 French Maloney dilator was  passed to full insertion.  A look back revealed that the ring had been  ruptured without apparent complication.  The patient tolerated the procedure  well and was reactive in endoscopy.   IMPRESSION:  Schatski's ring.  Otherwise normal esophagus, stomach, first  and second portions of the duodenum status post dilation, as described  above.  Hopefully her dysphagia will improve.  Today's findings do not  explain her abdominal pain.   RECOMMENDATIONS:  1. Will check an amylase, LFTs, and proceed with a right upper quadrant     ultrasound.  2. Will also ask her to stop the Prilosec and start taking Prevacid 30 mg     orally daily for the next weeks and will make further recommendations in     the near future.      ___________________________________________  Jonathon Bellows, M.D.   RMR/MEDQ  D:  05/06/2003  T:  05/07/2003  Job:  [id number]   cc:   Kingsley Callander. [name], M.D.  [address]  [place]  [place] [address]  Fax: [phone]

## 2010-06-27 ENCOUNTER — Ambulatory Visit (HOSPITAL_COMMUNITY): Payer: No Typology Code available for payment source

## 2010-06-27 ENCOUNTER — Ambulatory Visit (HOSPITAL_COMMUNITY)
Admission: RE | Admit: 2010-06-27 | Discharge: 2010-06-27 | Disposition: A | Discharge status: Home / Self Care | Payer: No Typology Code available for payment source | Source: Ambulatory Visit | Attending: Ophthalmology | Admitting: Ophthalmology

## 2010-06-27 ENCOUNTER — Encounter (HOSPITAL_COMMUNITY): Payer: Self-pay | Admitting: Oncology

## 2010-06-27 DIAGNOSIS — J449 Chronic obstructive pulmonary disease, unspecified: Secondary | ICD-10-CM | POA: Insufficient documentation

## 2010-06-27 DIAGNOSIS — N189 Chronic kidney disease, unspecified: Secondary | ICD-10-CM

## 2010-06-27 DIAGNOSIS — I10 Essential (primary) hypertension: Secondary | ICD-10-CM | POA: Insufficient documentation

## 2010-06-27 DIAGNOSIS — Z79899 Other long term (current) drug therapy: Secondary | ICD-10-CM | POA: Insufficient documentation

## 2010-06-27 DIAGNOSIS — D638 Anemia in other chronic diseases classified elsewhere: Secondary | ICD-10-CM

## 2010-06-27 DIAGNOSIS — J4489 Other specified chronic obstructive pulmonary disease: Secondary | ICD-10-CM | POA: Insufficient documentation

## 2010-06-27 DIAGNOSIS — H26499 Other secondary cataract, unspecified eye: Secondary | ICD-10-CM | POA: Insufficient documentation

## 2010-06-28 ENCOUNTER — Ambulatory Visit (HOSPITAL_COMMUNITY): Payer: No Typology Code available for payment source

## 2010-07-03 ENCOUNTER — Other Ambulatory Visit (HOSPITAL_COMMUNITY): Payer: Self-pay | Admitting: Oncology

## 2010-07-24 ENCOUNTER — Encounter (HOSPITAL_COMMUNITY): Payer: Self-pay | Admitting: *Deleted

## 2010-07-25 ENCOUNTER — Encounter (HOSPITAL_COMMUNITY): Payer: No Typology Code available for payment source | Attending: Oncology

## 2010-07-25 DIAGNOSIS — D649 Anemia, unspecified: Secondary | ICD-10-CM | POA: Insufficient documentation

## 2010-07-25 DIAGNOSIS — M810 Age-related osteoporosis without current pathological fracture: Secondary | ICD-10-CM | POA: Insufficient documentation

## 2010-07-25 LAB — CBC
MCH: 26.9 pg (ref 26.0–34.0)
MCHC: 32.4 g/dL (ref 30.0–36.0)
Platelets: 259 10*3/uL (ref 150–400)

## 2010-07-25 MED ORDER — DENOSUMAB 120 MG/1.7ML ~~LOC~~ SOLN
60.0000 mg | Freq: Once | SUBCUTANEOUS | Status: DC
  Filled 2010-07-25: qty 0.9

## 2010-07-25 MED ORDER — EPOETIN ALFA 40000 UNIT/ML IJ SOLN
INTRAMUSCULAR | Status: AC
  Administered 2010-07-25: 20000 [IU] via SUBCUTANEOUS
  Filled 2010-07-25: qty 1

## 2010-07-25 MED ORDER — CYANOCOBALAMIN 1000 MCG/ML IJ SOLN
INTRAMUSCULAR | Status: AC
  Administered 2010-07-25: 1000 ug via INTRAMUSCULAR
  Filled 2010-07-25: qty 1

## 2010-07-25 MED ORDER — CYANOCOBALAMIN 1000 MCG/ML IJ SOLN
1000.0000 ug | Freq: Once | INTRAMUSCULAR | Status: AC
  Administered 2010-07-25: 1000 ug via INTRAMUSCULAR

## 2010-07-25 MED ORDER — DENOSUMAB 60 MG/ML ~~LOC~~ SOLN
60.0000 mg | Freq: Once | SUBCUTANEOUS | Status: AC
  Administered 2010-07-25: 60 mg via SUBCUTANEOUS
  Filled 2010-07-25: qty 1

## 2010-07-25 MED ORDER — EPOETIN ALFA 40000 UNIT/ML IJ SOLN
INTRAMUSCULAR | Status: AC
  Administered 2010-07-25: 14:00:00 via SUBCUTANEOUS
  Filled 2010-07-25: qty 1

## 2010-07-25 MED ORDER — EPOETIN ALFA 10000 UNIT/ML IJ SOLN
60000.0000 [IU] | Freq: Once | INTRAMUSCULAR | Status: DC
  Filled 2010-07-25: qty 6

## 2010-07-27 ENCOUNTER — Encounter (INDEPENDENT_AMBULATORY_CARE_PROVIDER_SITE_OTHER): Payer: No Typology Code available for payment source | Admitting: Psychiatry

## 2010-07-27 DIAGNOSIS — F259 Schizoaffective disorder, unspecified: Secondary | ICD-10-CM

## 2010-08-02 ENCOUNTER — Other Ambulatory Visit (HOSPITAL_COMMUNITY): Payer: Self-pay

## 2010-08-02 DIAGNOSIS — D649 Anemia, unspecified: Secondary | ICD-10-CM

## 2010-08-02 MED ORDER — CYANOCOBALAMIN 1000 MCG/ML IJ SOLN: 1000.0000 ug | INTRAMUSCULAR | Status: DC

## 2010-08-03 ENCOUNTER — Other Ambulatory Visit (HOSPITAL_COMMUNITY): Payer: Self-pay | Admitting: Oncology

## 2010-08-03 ENCOUNTER — Encounter (HOSPITAL_COMMUNITY): Payer: Self-pay | Admitting: Oncology

## 2010-08-03 DIAGNOSIS — G629 Polyneuropathy, unspecified: Secondary | ICD-10-CM

## 2010-08-03 DIAGNOSIS — N289 Disorder of kidney and ureter, unspecified: Secondary | ICD-10-CM | POA: Insufficient documentation

## 2010-08-18 ENCOUNTER — Other Ambulatory Visit (HOSPITAL_COMMUNITY): Payer: Self-pay | Admitting: Oncology

## 2010-08-22 ENCOUNTER — Encounter (HOSPITAL_COMMUNITY): Payer: No Typology Code available for payment source

## 2010-08-22 ENCOUNTER — Encounter (HOSPITAL_COMMUNITY): Payer: No Typology Code available for payment source | Attending: Oncology

## 2010-08-22 ENCOUNTER — Encounter (HOSPITAL_BASED_OUTPATIENT_CLINIC_OR_DEPARTMENT_OTHER): Payer: No Typology Code available for payment source

## 2010-08-22 DIAGNOSIS — E538 Deficiency of other specified B group vitamins: Secondary | ICD-10-CM | POA: Insufficient documentation

## 2010-08-22 DIAGNOSIS — D638 Anemia in other chronic diseases classified elsewhere: Secondary | ICD-10-CM

## 2010-08-22 DIAGNOSIS — N289 Disorder of kidney and ureter, unspecified: Secondary | ICD-10-CM

## 2010-08-22 LAB — CBC
MCH: 27.1 pg (ref 26.0–34.0)
MCHC: 33.3 g/dL (ref 30.0–36.0)
Platelets: 253 10*3/uL (ref 150–400)
RBC: 3.62 MIL/uL — ABNORMAL LOW (ref 3.87–5.11)
RDW: 15.6 % — ABNORMAL HIGH (ref 11.5–15.5)

## 2010-08-22 LAB — FERRITIN: Ferritin: 526 ng/mL — ABNORMAL HIGH (ref 10–291)

## 2010-08-22 MED ORDER — EPOETIN ALFA 20000 UNIT/ML IJ SOLN
INTRAMUSCULAR | Status: AC
  Administered 2010-08-22: 20000 [IU]
  Filled 2010-08-22: qty 1

## 2010-08-22 MED ORDER — CYANOCOBALAMIN 1000 MCG/ML IJ SOLN
1000.0000 ug | Freq: Once | INTRAMUSCULAR | Status: AC
  Administered 2010-08-22: 1000 ug via INTRAMUSCULAR

## 2010-08-22 MED ORDER — EPOETIN ALFA 10000 UNIT/ML IJ SOLN: 60000.0000 [IU] | Freq: Once | INTRAMUSCULAR | Status: DC

## 2010-08-22 MED ORDER — EPOETIN ALFA 40000 UNIT/ML IJ SOLN
INTRAMUSCULAR | Status: AC
  Administered 2010-08-22: 40000 [IU]
  Filled 2010-08-22: qty 1

## 2010-08-22 MED ORDER — CYANOCOBALAMIN 1000 MCG/ML IJ SOLN
INTRAMUSCULAR | Status: AC
  Administered 2010-08-22: 1000 ug via INTRAMUSCULAR
  Filled 2010-08-22: qty 1

## 2010-08-29 ENCOUNTER — Encounter (INDEPENDENT_AMBULATORY_CARE_PROVIDER_SITE_OTHER): Payer: No Typology Code available for payment source | Admitting: Psychiatry

## 2010-08-29 ENCOUNTER — Other Ambulatory Visit (HOSPITAL_COMMUNITY): Payer: Self-pay | Admitting: Oncology

## 2010-08-29 DIAGNOSIS — F200 Paranoid schizophrenia: Secondary | ICD-10-CM

## 2010-08-30 NOTE — Progress Notes (Signed)
Addended by: Oda Kilts on: 08/30/2010 12:39 PM   Modules accepted: Orders

## 2010-09-22 ENCOUNTER — Other Ambulatory Visit (HOSPITAL_COMMUNITY): Payer: No Typology Code available for payment source

## 2010-09-22 ENCOUNTER — Encounter (HOSPITAL_COMMUNITY): Payer: No Typology Code available for payment source | Attending: Oncology

## 2010-09-22 ENCOUNTER — Encounter (HOSPITAL_BASED_OUTPATIENT_CLINIC_OR_DEPARTMENT_OTHER): Payer: No Typology Code available for payment source

## 2010-09-22 ENCOUNTER — Encounter (HOSPITAL_COMMUNITY): Payer: No Typology Code available for payment source

## 2010-09-22 DIAGNOSIS — D649 Anemia, unspecified: Secondary | ICD-10-CM | POA: Insufficient documentation

## 2010-09-22 DIAGNOSIS — E538 Deficiency of other specified B group vitamins: Secondary | ICD-10-CM

## 2010-09-22 DIAGNOSIS — M810 Age-related osteoporosis without current pathological fracture: Secondary | ICD-10-CM | POA: Insufficient documentation

## 2010-09-22 DIAGNOSIS — N289 Disorder of kidney and ureter, unspecified: Secondary | ICD-10-CM

## 2010-09-22 DIAGNOSIS — D638 Anemia in other chronic diseases classified elsewhere: Secondary | ICD-10-CM

## 2010-09-22 LAB — CBC
MCHC: 32.3 g/dL (ref 30.0–36.0)
Platelets: 252 10*3/uL (ref 150–400)
RDW: 16.6 % — ABNORMAL HIGH (ref 11.5–15.5)
WBC: 5.7 10*3/uL (ref 4.0–10.5)

## 2010-09-22 LAB — COMPREHENSIVE METABOLIC PANEL
Alkaline Phosphatase: 45 U/L (ref 39–117)
BUN: 51 mg/dL — ABNORMAL HIGH (ref 6–23)
CO2: 23 mEq/L (ref 19–32)
Chloride: 93 mEq/L — ABNORMAL LOW (ref 96–112)
GFR calc Af Amer: 12 mL/min — ABNORMAL LOW (ref >60)
Glucose, Bld: 88 mg/dL (ref 70–99)
Potassium: 4.7 mEq/L (ref 3.5–5.1)
Total Bilirubin: 0.3 mg/dL (ref 0.3–1.2)

## 2010-09-22 LAB — PHOSPHORUS: Phosphorus: 5.1 mg/dL — ABNORMAL HIGH (ref 2.3–4.6)

## 2010-09-22 LAB — PROTEIN / CREATININE RATIO, URINE
Creatinine, Urine: 34.3 mg/dL
Protein Creatinine Ratio: 4.29 — ABNORMAL HIGH (ref 0.00–0.15)
Total Protein, Urine: 147 mg/dL

## 2010-09-22 MED ORDER — EPOETIN ALFA 10000 UNIT/ML IJ SOLN: 60000.0000 [IU] | Freq: Once | INTRAMUSCULAR | Status: DC

## 2010-09-22 MED ORDER — CYANOCOBALAMIN 1000 MCG/ML IJ SOLN
1000.0000 ug | Freq: Once | INTRAMUSCULAR | Status: AC
  Administered 2010-09-22: 1000 ug via INTRAMUSCULAR

## 2010-09-22 MED ORDER — EPOETIN ALFA 40000 UNIT/ML IJ SOLN
INTRAMUSCULAR | Status: AC
  Administered 2010-09-22: 40000 [IU] via SUBCUTANEOUS
  Filled 2010-09-22: qty 1

## 2010-09-22 MED ORDER — EPOETIN ALFA 20000 UNIT/ML IJ SOLN
INTRAMUSCULAR | Status: AC
  Administered 2010-09-22: 20000 [IU] via SUBCUTANEOUS
  Filled 2010-09-22: qty 1

## 2010-09-22 MED ORDER — CYANOCOBALAMIN 1000 MCG/ML IJ SOLN
INTRAMUSCULAR | Status: AC
  Administered 2010-09-22: 1000 ug via INTRAMUSCULAR
  Filled 2010-09-22: qty 1

## 2010-09-22 NOTE — Progress Notes (Unsigned)
[name] presents today for injection per MD orders. Procrit 60,000units administered SQ in left and right abdomen in divided doses. Administration without incident. Patient tolerated well.

## 2010-09-22 NOTE — Progress Notes (Signed)
[name] presents today for injection per MD orders. B12 administered IM in left Upper Arm. Administration without incident. Patient tolerated well.

## 2010-10-05 ENCOUNTER — Encounter (INDEPENDENT_AMBULATORY_CARE_PROVIDER_SITE_OTHER): Payer: No Typology Code available for payment source | Admitting: Psychiatry

## 2010-10-05 DIAGNOSIS — F200 Paranoid schizophrenia: Secondary | ICD-10-CM

## 2010-10-05 LAB — COMPREHENSIVE METABOLIC PANEL
ALT: 16
AST: 22
CO2: 26
Calcium: 9.1
Creatinine, Ser: 1.92 — ABNORMAL HIGH
GFR calc Af Amer: 32 — ABNORMAL LOW
GFR calc non Af Amer: 26 — ABNORMAL LOW
Sodium: 135
Total Protein: 7.1

## 2010-10-05 LAB — CBC
MCHC: 32.7
MCV: 83
RDW: 17 — ABNORMAL HIGH

## 2010-10-06 LAB — CBC
HCT: 30.9 — ABNORMAL LOW
Hemoglobin: 10.4 — ABNORMAL LOW
MCV: 82.9
RBC: 3.73 — ABNORMAL LOW
WBC: 6.7

## 2010-10-06 LAB — COMPREHENSIVE METABOLIC PANEL
Albumin: 3.4 — ABNORMAL LOW
Alkaline Phosphatase: 51
BUN: 23
CO2: 28
Chloride: 101
Creatinine, Ser: 2.01 — ABNORMAL HIGH
GFR calc non Af Amer: 25 — ABNORMAL LOW
Glucose, Bld: 113 — ABNORMAL HIGH
Potassium: 4.1
Total Bilirubin: 0.4

## 2010-10-10 LAB — CBC
Hemoglobin: 11.1 — ABNORMAL LOW
MCHC: 34.1
RBC: 3.98
WBC: 7.4

## 2010-10-11 LAB — CBC
Hemoglobin: 11.1 — ABNORMAL LOW
MCHC: 34.2
MCV: 82.6
RBC: 3.94
WBC: 7.3

## 2010-10-13 LAB — CBC
HCT: 29.9 — ABNORMAL LOW
MCHC: 33.1
MCV: 81.8
Platelets: 262
WBC: 8.8

## 2010-10-16 LAB — CBC
HCT: 30.9 — ABNORMAL LOW
HCT: 34.7 — ABNORMAL LOW
Hemoglobin: 11.3 — ABNORMAL LOW
MCV: 84.5
Platelets: 265
Platelets: 304
RBC: 4.08
RDW: 18.6 — ABNORMAL HIGH
RDW: 18.5 — ABNORMAL HIGH
WBC: 7.7

## 2010-10-16 LAB — DIFFERENTIAL
Lymphocytes Relative: 26
Lymphs Abs: 2
Monocytes Absolute: 0.5
Monocytes Relative: 6
Neutro Abs: 5.1

## 2010-10-17 LAB — CBC
HCT: 34.2 % — ABNORMAL LOW (ref 36.0–46.0)
Hemoglobin: 11.2 g/dL — ABNORMAL LOW (ref 12.0–15.0)
MCHC: 32.8 g/dL (ref 30.0–36.0)
MCV: 83 fL (ref 78.0–100.0)
RDW: 17.7 % — ABNORMAL HIGH (ref 11.5–15.5)

## 2010-10-19 LAB — CBC
MCHC: 32.8 g/dL (ref 30.0–36.0)
MCV: 82.2 fL (ref 78.0–100.0)
RBC: 4.49 MIL/uL (ref 3.87–5.11)

## 2010-10-19 LAB — BASIC METABOLIC PANEL
BUN: 30 mg/dL — ABNORMAL HIGH (ref 6–23)
Calcium: 8.9 mg/dL (ref 8.4–10.5)
Creatinine, Ser: 2.12 mg/dL — ABNORMAL HIGH (ref 0.4–1.2)
GFR calc non Af Amer: 23 mL/min — ABNORMAL LOW (ref >60)
Glucose, Bld: 89 mg/dL (ref 70–99)
Potassium: 3.7 mEq/L (ref 3.5–5.1)

## 2010-10-20 ENCOUNTER — Ambulatory Visit (HOSPITAL_COMMUNITY): Payer: No Typology Code available for payment source

## 2010-10-20 ENCOUNTER — Other Ambulatory Visit (HOSPITAL_COMMUNITY): Payer: No Typology Code available for payment source

## 2010-10-20 LAB — CBC
MCV: 83.6
Platelets: 301
WBC: 7.2

## 2010-10-20 LAB — COMPREHENSIVE METABOLIC PANEL
AST: 26
Albumin: 3.3 — ABNORMAL LOW
Calcium: 9
Chloride: 97
Creatinine, Ser: 1.81 — ABNORMAL HIGH
GFR calc Af Amer: 34 — ABNORMAL LOW

## 2010-10-24 LAB — CBC
HCT: 33.7 — ABNORMAL LOW
Hemoglobin: 11.2 — ABNORMAL LOW
MCHC: 33.2
MCV: 83.6
RDW: 15.9 — ABNORMAL HIGH

## 2010-10-25 ENCOUNTER — Other Ambulatory Visit (HOSPITAL_COMMUNITY): Payer: Self-pay | Admitting: Oncology

## 2010-10-25 ENCOUNTER — Encounter (HOSPITAL_COMMUNITY): Payer: No Typology Code available for payment source | Attending: Oncology

## 2010-10-25 ENCOUNTER — Encounter (HOSPITAL_BASED_OUTPATIENT_CLINIC_OR_DEPARTMENT_OTHER): Payer: No Typology Code available for payment source

## 2010-10-25 ENCOUNTER — Encounter (HOSPITAL_COMMUNITY): Payer: No Typology Code available for payment source

## 2010-10-25 DIAGNOSIS — D638 Anemia in other chronic diseases classified elsewhere: Secondary | ICD-10-CM | POA: Insufficient documentation

## 2010-10-25 DIAGNOSIS — M810 Age-related osteoporosis without current pathological fracture: Secondary | ICD-10-CM | POA: Insufficient documentation

## 2010-10-25 DIAGNOSIS — N289 Disorder of kidney and ureter, unspecified: Secondary | ICD-10-CM | POA: Insufficient documentation

## 2010-10-25 DIAGNOSIS — D649 Anemia, unspecified: Secondary | ICD-10-CM

## 2010-10-25 LAB — CBC
Hemoglobin: 9.4 g/dL — ABNORMAL LOW (ref 12.0–15.0)
MCHC: 33.2
MCHC: 32.6 g/dL (ref 30.0–36.0)
Platelets: 253 10*3/uL (ref 150–400)
RBC: 3.51 MIL/uL — ABNORMAL LOW (ref 3.87–5.11)
RDW: 16.3 — ABNORMAL HIGH

## 2010-10-25 LAB — DIFFERENTIAL
Basophils Absolute: 0.1
Basophils Relative: 1
Neutro Abs: 5
Neutrophils Relative %: 62

## 2010-10-25 MED ORDER — EPOETIN ALFA 20000 UNIT/ML IJ SOLN: 20000.0000 [IU] | INTRAMUSCULAR | Status: DC

## 2010-10-25 MED ORDER — EPOETIN ALFA 10000 UNIT/ML IJ SOLN: 60000.0000 [IU] | Freq: Once | INTRAMUSCULAR | Status: DC

## 2010-10-25 MED ORDER — EPOETIN ALFA 40000 UNIT/ML IJ SOLN
INTRAMUSCULAR | Status: AC
  Administered 2010-10-25: 40000 [IU] via SUBCUTANEOUS
  Filled 2010-10-25: qty 1

## 2010-10-25 MED ORDER — CYANOCOBALAMIN 1000 MCG/ML IJ SOLN: 1000.0000 ug | Freq: Once | INTRAMUSCULAR | Status: DC

## 2010-10-25 MED ORDER — CYANOCOBALAMIN 1000 MCG/ML IJ SOLN
INTRAMUSCULAR | Status: AC
  Administered 2010-10-25: 1000 ug via INTRAMUSCULAR
  Filled 2010-10-25: qty 1

## 2010-10-25 MED ORDER — EPOETIN ALFA 20000 UNIT/ML IJ SOLN
INTRAMUSCULAR | Status: AC
  Administered 2010-10-25: 20000 [IU] via SUBCUTANEOUS
  Filled 2010-10-25: qty 1

## 2010-10-25 NOTE — Progress Notes (Signed)
VSS.Specimen from left [facility]. Vitamin B-12 right deltoid.Procrit 60,000 units lower abd. Tolerated all well.

## 2010-10-26 LAB — CBC
HCT: 33.7 — ABNORMAL LOW
MCV: 84.2
RBC: 4.01
WBC: 10.4

## 2010-10-27 LAB — CBC
MCHC: 33.7
RBC: 4.24
RDW: 17.1 — ABNORMAL HIGH

## 2010-10-30 LAB — CBC
Hemoglobin: 11.9 — ABNORMAL LOW
Platelets: 300
RDW: 17.3 — ABNORMAL HIGH

## 2010-10-30 LAB — COMPREHENSIVE METABOLIC PANEL
ALT: 13
Albumin: 3.3 — ABNORMAL LOW
Alkaline Phosphatase: 56
Glucose, Bld: 107 — ABNORMAL HIGH
Potassium: 3.8
Sodium: 135
Total Protein: 7.1

## 2010-11-01 LAB — IRON AND TIBC: Iron: 83

## 2010-11-01 LAB — CBC
MCHC: 34
MCV: 81.7
Platelets: 282

## 2010-11-15 ENCOUNTER — Ambulatory Visit (HOSPITAL_COMMUNITY): Payer: No Typology Code available for payment source | Admitting: Oncology

## 2010-11-21 ENCOUNTER — Encounter (HOSPITAL_COMMUNITY): Payer: No Typology Code available for payment source | Attending: Oncology

## 2010-11-21 ENCOUNTER — Encounter (HOSPITAL_COMMUNITY): Payer: No Typology Code available for payment source

## 2010-11-21 DIAGNOSIS — D649 Anemia, unspecified: Secondary | ICD-10-CM | POA: Insufficient documentation

## 2010-11-21 LAB — CBC
HCT: 26.7 % — ABNORMAL LOW (ref 36.0–46.0)
Hemoglobin: 8.7 g/dL — ABNORMAL LOW (ref 12.0–15.0)
MCH: 26.9 pg (ref 26.0–34.0)
MCHC: 32.6 g/dL (ref 30.0–36.0)

## 2010-11-21 MED ORDER — FUROSEMIDE 10 MG/ML IJ SOLN
INTRAMUSCULAR | Status: AC
  Filled 2010-11-21: qty 2

## 2010-11-21 MED ORDER — CYANOCOBALAMIN 1000 MCG/ML IJ SOLN: 1000.0000 ug | Freq: Once | INTRAMUSCULAR | Status: DC

## 2010-11-21 MED ORDER — EPOETIN ALFA 20000 UNIT/ML IJ SOLN
INTRAMUSCULAR | Status: AC
  Administered 2010-11-21: 20000 [IU] via SUBCUTANEOUS
  Filled 2010-11-21: qty 1

## 2010-11-21 MED ORDER — EPOETIN ALFA 40000 UNIT/ML IJ SOLN
INTRAMUSCULAR | Status: AC
  Administered 2010-11-21: 40000 [IU] via SUBCUTANEOUS
  Filled 2010-11-21: qty 1

## 2010-11-21 MED ORDER — EPOETIN ALFA 20000 UNIT/ML IJ SOLN: 60000.0000 [IU] | INTRAMUSCULAR | Status: DC

## 2010-11-21 MED ORDER — CYANOCOBALAMIN 1000 MCG/ML IJ SOLN
INTRAMUSCULAR | Status: AC
  Filled 2010-11-21: qty 1

## 2010-11-21 NOTE — Progress Notes (Signed)
Tolerated injections and venipuncture well. 

## 2010-11-23 ENCOUNTER — Ambulatory Visit (HOSPITAL_COMMUNITY): Payer: No Typology Code available for payment source

## 2010-11-30 ENCOUNTER — Ambulatory Visit (INDEPENDENT_AMBULATORY_CARE_PROVIDER_SITE_OTHER): Payer: No Typology Code available for payment source | Admitting: Psychiatry

## 2010-11-30 ENCOUNTER — Encounter (HOSPITAL_COMMUNITY): Payer: Self-pay | Admitting: Psychiatry

## 2010-11-30 ENCOUNTER — Encounter (HOSPITAL_COMMUNITY): Payer: No Typology Code available for payment source | Admitting: Psychiatry

## 2010-11-30 DIAGNOSIS — F259 Schizoaffective disorder, unspecified: Secondary | ICD-10-CM

## 2010-11-30 MED ORDER — ALPRAZOLAM 0.5 MG PO TABS: 0.5000 mg | ORAL_TABLET | Freq: Two times a day (BID) | ORAL | Status: DC

## 2010-11-30 MED ORDER — RISPERIDONE 1 MG PO TABS: 0.5000 mg | ORAL_TABLET | Freq: Every day | ORAL | Status: DC

## 2010-11-30 MED ORDER — CITALOPRAM HYDROBROMIDE 20 MG PO TABS: 20.0000 mg | ORAL_TABLET | Freq: Every day | ORAL | Status: DC

## 2010-11-30 NOTE — Progress Notes (Signed)
Patient came for her followup appointment with her daughter. She's been compliant with her medication she is more alert active and energetic. Since we have cut down her Risperdal and Xanax she appears to be more involved in her daily life. She is taking her Celexa also 20 mg daily. She's been regularly seeing Dr. [name] for her medical checkup. She's been sleeping good and daughter endorse the she's been doing much better on her medication. She reported no side effects of medication including any tremors or extrapyramidal side effects. She continues to have some mild paranoid ideation and hallucination when she hears birds noise but lately it is very mild and less intense. She does not want to start Risperdal at this time as she feels her voice is may get or intense. She's also taking Aricept that has been helping her memory and concentration.  Mental status emanation patient is casually dressed she is cooperative her speech at times fast but coherent. Her attention and concentration is fair. She denies any active or passive suicidal thinking. There is no delusion present at this time. She's alert and oriented x3. Her thought process is slow but logical. Her insight and judgment and impulse control is okay.  Assessment schizoaffective disorder  Plan I will continue Xanax Celexa and Risperdal at present dose. I have explained the risks and benefits of medication in detail. I will see her again in 3 months.

## 2010-12-02 ENCOUNTER — Other Ambulatory Visit (HOSPITAL_COMMUNITY): Payer: Self-pay

## 2010-12-19 ENCOUNTER — Encounter (HOSPITAL_COMMUNITY): Payer: Medicare Other

## 2010-12-19 ENCOUNTER — Ambulatory Visit (HOSPITAL_COMMUNITY): Payer: No Typology Code available for payment source

## 2010-12-25 ENCOUNTER — Ambulatory Visit (HOSPITAL_COMMUNITY): Payer: No Typology Code available for payment source

## 2011-01-02 ENCOUNTER — Encounter (HOSPITAL_COMMUNITY): Payer: No Typology Code available for payment source | Attending: Oncology

## 2011-01-02 ENCOUNTER — Telehealth (HOSPITAL_COMMUNITY): Payer: Self-pay | Admitting: Oncology

## 2011-01-02 ENCOUNTER — Encounter (HOSPITAL_COMMUNITY): Payer: No Typology Code available for payment source

## 2011-01-02 DIAGNOSIS — E538 Deficiency of other specified B group vitamins: Secondary | ICD-10-CM | POA: Insufficient documentation

## 2011-01-02 DIAGNOSIS — N289 Disorder of kidney and ureter, unspecified: Secondary | ICD-10-CM

## 2011-01-02 DIAGNOSIS — M810 Age-related osteoporosis without current pathological fracture: Secondary | ICD-10-CM

## 2011-01-02 DIAGNOSIS — D649 Anemia, unspecified: Secondary | ICD-10-CM

## 2011-01-02 LAB — CBC
MCH: 26.9 pg (ref 26.0–34.0)
MCV: 84.9 fL (ref 78.0–100.0)
Platelets: 210 10*3/uL (ref 150–400)
RDW: 18.1 % — ABNORMAL HIGH (ref 11.5–15.5)

## 2011-01-02 MED ORDER — CYANOCOBALAMIN 1000 MCG/ML IJ SOLN
INTRAMUSCULAR | Status: AC
  Filled 2011-01-02: qty 1

## 2011-01-02 MED ORDER — EPOETIN ALFA 10000 UNIT/ML IJ SOLN
60000.0000 [IU] | Freq: Once | INTRAMUSCULAR | Status: DC
  Filled 2011-01-02: qty 6

## 2011-01-02 MED ORDER — EPOETIN ALFA 40000 UNIT/ML IJ SOLN
INTRAMUSCULAR | Status: AC
  Administered 2011-01-02: 60000 [IU] via SUBCUTANEOUS
  Filled 2011-01-02: qty 1

## 2011-01-02 MED ORDER — EPOETIN ALFA 20000 UNIT/ML IJ SOLN
INTRAMUSCULAR | Status: AC
  Filled 2011-01-02: qty 1

## 2011-01-02 MED ORDER — DENOSUMAB 60 MG/ML ~~LOC~~ SOLN
60.0000 mg | Freq: Once | SUBCUTANEOUS | Status: AC
  Administered 2011-01-02: 60 mg via SUBCUTANEOUS
  Filled 2011-01-02: qty 1

## 2011-01-02 MED ORDER — CYANOCOBALAMIN 1000 MCG/ML IJ SOLN
1000.0000 ug | Freq: Once | INTRAMUSCULAR | Status: AC
  Administered 2011-01-02: 1000 ug via INTRAMUSCULAR

## 2011-01-02 NOTE — Progress Notes (Signed)
[name] presents today for injection per MD orders. prolia 60 mg administered SQ in left Abdomen. Administration without incident. Patient tolerated well.

## 2011-01-02 NOTE — Progress Notes (Signed)
[name] presented for labwork. Labs per MD order drawn via Peripheral Line 25 gauge needle inserted in rt arm.  Good blood return present. Procedure without incident.  Needle removed intact. Patient tolerated procedure well.

## 2011-01-02 NOTE — Progress Notes (Signed)
Laurey Morale Fiske presents today for injection per MD orders. B12 1000 mcg administered SQ in right Upper Arm. Administration without incident. Patient tolerated well.

## 2011-01-02 NOTE — Progress Notes (Signed)
[name] presents today for injection per MD orders. Procrit 16109 units administered SQ in right Abdomen. Administration without incident. Patient tolerated well.

## 2011-01-03 ENCOUNTER — Encounter (HOSPITAL_BASED_OUTPATIENT_CLINIC_OR_DEPARTMENT_OTHER): Payer: No Typology Code available for payment source

## 2011-01-03 ENCOUNTER — Other Ambulatory Visit (HOSPITAL_COMMUNITY): Payer: Self-pay | Admitting: *Deleted

## 2011-01-03 ENCOUNTER — Other Ambulatory Visit (HOSPITAL_COMMUNITY): Payer: Self-pay | Admitting: Oncology

## 2011-01-03 DIAGNOSIS — D649 Anemia, unspecified: Secondary | ICD-10-CM

## 2011-01-03 LAB — IRON AND TIBC: UIBC: 119 ug/dL — ABNORMAL LOW (ref 125–400)

## 2011-01-03 LAB — HEMOGLOBIN AND HEMATOCRIT, BLOOD: HCT: 24.5 % — ABNORMAL LOW (ref 36.0–46.0)

## 2011-01-03 NOTE — Progress Notes (Signed)
Labs drawn today for type and cross, Ferr,Iron and IBC

## 2011-01-04 ENCOUNTER — Other Ambulatory Visit (HOSPITAL_COMMUNITY): Payer: Self-pay | Admitting: Oncology

## 2011-01-04 ENCOUNTER — Encounter (HOSPITAL_BASED_OUTPATIENT_CLINIC_OR_DEPARTMENT_OTHER): Payer: No Typology Code available for payment source

## 2011-01-04 VITALS — BP 130/82 | HR 69 | Temp 97.8°F | Resp 20

## 2011-01-04 DIAGNOSIS — E538 Deficiency of other specified B group vitamins: Secondary | ICD-10-CM

## 2011-01-04 DIAGNOSIS — D649 Anemia, unspecified: Secondary | ICD-10-CM

## 2011-01-04 MED ORDER — DIPHENHYDRAMINE HCL 25 MG PO CAPS
25.0000 mg | ORAL_CAPSULE | Freq: Once | ORAL | Status: AC
  Administered 2011-01-04: 25 mg via ORAL

## 2011-01-04 MED ORDER — SODIUM CHLORIDE 0.9 % IV SOLN
Freq: Once | INTRAVENOUS | Status: AC
  Administered 2011-01-04: 12:00:00 via INTRAVENOUS

## 2011-01-04 MED ORDER — DIPHENHYDRAMINE HCL 25 MG PO CAPS
ORAL_CAPSULE | ORAL | Status: AC
  Administered 2011-01-04: 25 mg via ORAL
  Filled 2011-01-04: qty 1

## 2011-01-04 MED ORDER — ACETAMINOPHEN 500 MG PO TABS
ORAL_TABLET | ORAL | Status: AC
  Administered 2011-01-04: 500 mg via ORAL
  Filled 2011-01-04: qty 1

## 2011-01-04 MED ORDER — LORATADINE 10 MG PO TABS
ORAL_TABLET | ORAL | Status: AC
  Filled 2011-01-04: qty 1

## 2011-01-04 MED ORDER — ACETAMINOPHEN 325 MG PO TABS
650.0000 mg | ORAL_TABLET | Freq: Once | ORAL | Status: AC
  Administered 2011-01-04: 500 mg via ORAL
  Filled 2011-01-04: qty 2

## 2011-01-04 MED ORDER — SODIUM CHLORIDE 0.9 % IJ SOLN
INTRAMUSCULAR | Status: AC
  Filled 2011-01-04: qty 10

## 2011-01-04 NOTE — Progress Notes (Signed)
Tolerated transfusion well. 

## 2011-01-05 LAB — TYPE AND SCREEN
ABO/RH(D): O POS
Unit division: 0

## 2011-01-11 ENCOUNTER — Ambulatory Visit (HOSPITAL_COMMUNITY): Payer: No Typology Code available for payment source

## 2011-01-12 LAB — OCCULT BLOOD X 1 CARD TO LAB, STOOL
Fecal Occult Bld: NEGATIVE
Fecal Occult Bld: NEGATIVE

## 2011-01-16 ENCOUNTER — Inpatient Hospital Stay (HOSPITAL_COMMUNITY): Payer: Medicare Other

## 2011-01-16 ENCOUNTER — Encounter (HOSPITAL_COMMUNITY): Payer: Self-pay | Admitting: *Deleted

## 2011-01-16 ENCOUNTER — Emergency Department (HOSPITAL_COMMUNITY): Payer: Medicare Other

## 2011-01-16 ENCOUNTER — Inpatient Hospital Stay (HOSPITAL_COMMUNITY)
Admission: EM | Admit: 2011-01-16 | Discharge: 2011-01-24 | DRG: 190 | Disposition: A | Discharge status: Home Health | Payer: Medicare Other | Attending: Internal Medicine | Admitting: Internal Medicine

## 2011-01-16 ENCOUNTER — Other Ambulatory Visit: Payer: Self-pay

## 2011-01-16 DIAGNOSIS — N186 End stage renal disease: Secondary | ICD-10-CM | POA: Diagnosis present

## 2011-01-16 DIAGNOSIS — Z992 Dependence on renal dialysis: Secondary | ICD-10-CM

## 2011-01-16 DIAGNOSIS — N179 Acute kidney failure, unspecified: Secondary | ICD-10-CM | POA: Diagnosis present

## 2011-01-16 DIAGNOSIS — M129 Arthropathy, unspecified: Secondary | ICD-10-CM | POA: Diagnosis present

## 2011-01-16 DIAGNOSIS — J441 Chronic obstructive pulmonary disease with (acute) exacerbation: Principal | ICD-10-CM | POA: Diagnosis present

## 2011-01-16 DIAGNOSIS — N19 Unspecified kidney failure: Secondary | ICD-10-CM

## 2011-01-16 DIAGNOSIS — G609 Hereditary and idiopathic neuropathy, unspecified: Secondary | ICD-10-CM | POA: Diagnosis present

## 2011-01-16 DIAGNOSIS — N2581 Secondary hyperparathyroidism of renal origin: Secondary | ICD-10-CM | POA: Diagnosis present

## 2011-01-16 DIAGNOSIS — I120 Hypertensive chronic kidney disease with stage 5 chronic kidney disease or end stage renal disease: Secondary | ICD-10-CM | POA: Diagnosis present

## 2011-01-16 DIAGNOSIS — E875 Hyperkalemia: Secondary | ICD-10-CM | POA: Diagnosis present

## 2011-01-16 DIAGNOSIS — E871 Hypo-osmolality and hyponatremia: Secondary | ICD-10-CM | POA: Diagnosis present

## 2011-01-16 DIAGNOSIS — D649 Anemia, unspecified: Secondary | ICD-10-CM

## 2011-01-16 DIAGNOSIS — D631 Anemia in chronic kidney disease: Secondary | ICD-10-CM | POA: Diagnosis present

## 2011-01-16 DIAGNOSIS — J209 Acute bronchitis, unspecified: Secondary | ICD-10-CM | POA: Diagnosis present

## 2011-01-16 DIAGNOSIS — K219 Gastro-esophageal reflux disease without esophagitis: Secondary | ICD-10-CM | POA: Diagnosis present

## 2011-01-16 DIAGNOSIS — N189 Chronic kidney disease, unspecified: Secondary | ICD-10-CM | POA: Diagnosis present

## 2011-01-16 DIAGNOSIS — D638 Anemia in other chronic diseases classified elsewhere: Secondary | ICD-10-CM | POA: Diagnosis present

## 2011-01-16 DIAGNOSIS — I10 Essential (primary) hypertension: Secondary | ICD-10-CM | POA: Diagnosis present

## 2011-01-16 DIAGNOSIS — E872 Acidosis, unspecified: Secondary | ICD-10-CM | POA: Diagnosis present

## 2011-01-16 DIAGNOSIS — F259 Schizoaffective disorder, unspecified: Secondary | ICD-10-CM | POA: Diagnosis present

## 2011-01-16 HISTORY — DX: Unspecified osteoarthritis, unspecified site: M19.90

## 2011-01-16 LAB — LIPASE, BLOOD: Lipase: 68 U/L — ABNORMAL HIGH (ref 11–59)

## 2011-01-16 LAB — GLUCOSE, CAPILLARY: Glucose-Capillary: 99 mg/dL (ref 70–99)

## 2011-01-16 LAB — CBC
HCT: 27.9 % — ABNORMAL LOW (ref 36.0–46.0)
MCV: 87.5 fL (ref 78.0–100.0)
RDW: 19.1 % — ABNORMAL HIGH (ref 11.5–15.5)
WBC: 7.5 10*3/uL (ref 4.0–10.5)

## 2011-01-16 LAB — URINE MICROSCOPIC-ADD ON

## 2011-01-16 LAB — DIFFERENTIAL
Basophils Absolute: 0 10*3/uL (ref 0.0–0.1)
Eosinophils Relative: 2 % (ref 0–5)
Lymphocytes Relative: 19 % (ref 12–46)
Lymphs Abs: 1.4 10*3/uL (ref 0.7–4.0)
Monocytes Absolute: 0.6 10*3/uL (ref 0.1–1.0)

## 2011-01-16 LAB — COMPREHENSIVE METABOLIC PANEL
BUN: 67 mg/dL — ABNORMAL HIGH (ref 6–23)
CO2: 17 mEq/L — ABNORMAL LOW (ref 19–32)
Calcium: 5.5 mg/dL — CL (ref 8.4–10.5)
Creatinine, Ser: 6.83 mg/dL — ABNORMAL HIGH (ref 0.50–1.10)
GFR calc Af Amer: 6 mL/min — ABNORMAL LOW (ref >90)
GFR calc non Af Amer: 6 mL/min — ABNORMAL LOW (ref >90)
Glucose, Bld: 111 mg/dL — ABNORMAL HIGH (ref 70–99)

## 2011-01-16 LAB — AMMONIA: Ammonia: 26 umol/L (ref 11–60)

## 2011-01-16 LAB — URINALYSIS, ROUTINE W REFLEX MICROSCOPIC
Bilirubin Urine: NEGATIVE
Nitrite: POSITIVE — AB
Specific Gravity, Urine: 1.02 (ref 1.005–1.030)
pH: 5 (ref 5.0–8.0)

## 2011-01-16 MED ORDER — AMLODIPINE BESYLATE 5 MG PO TABS
10.0000 mg | ORAL_TABLET | Freq: Every day | ORAL | Status: DC
  Administered 2011-01-16 – 2011-01-20 (×5): 10 mg via ORAL
  Filled 2011-01-16 (×2): qty 2
  Filled 2011-01-16: qty 1
  Filled 2011-01-16 (×2): qty 2

## 2011-01-16 MED ORDER — TRAMADOL HCL 50 MG PO TABS
50.0000 mg | ORAL_TABLET | Freq: Two times a day (BID) | ORAL | Status: DC
  Administered 2011-01-16 – 2011-01-23 (×15): 50 mg via ORAL
  Filled 2011-01-16 (×15): qty 1

## 2011-01-16 MED ORDER — TRAZODONE HCL 50 MG PO TABS
25.0000 mg | ORAL_TABLET | Freq: Every evening | ORAL | Status: DC | PRN
  Administered 2011-01-20 – 2011-01-23 (×3): 25 mg via ORAL
  Filled 2011-01-16 (×4): qty 1

## 2011-01-16 MED ORDER — INFLUENZA VIRUS VACC SPLIT PF IM SUSP
0.5000 mL | Freq: Once | INTRAMUSCULAR | Status: DC
  Filled 2011-01-16: qty 0.5

## 2011-01-16 MED ORDER — SODIUM CHLORIDE 0.9 % IV SOLN: INTRAVENOUS | Status: DC

## 2011-01-16 MED ORDER — ALBUTEROL SULFATE (5 MG/ML) 0.5% IN NEBU
5.0000 mg | INHALATION_SOLUTION | Freq: Once | RESPIRATORY_TRACT | Status: AC
  Administered 2011-01-16: 5 mg via RESPIRATORY_TRACT
  Filled 2011-01-16: qty 1

## 2011-01-16 MED ORDER — IPRATROPIUM BROMIDE 0.02 % IN SOLN: 0.5000 mg | RESPIRATORY_TRACT | Status: DC

## 2011-01-16 MED ORDER — HYDROMORPHONE HCL PF 1 MG/ML IJ SOLN
0.5000 mg | INTRAMUSCULAR | Status: DC | PRN
  Administered 2011-01-16 (×2): 0.5 mg via INTRAVENOUS
  Filled 2011-01-16 (×2): qty 1

## 2011-01-16 MED ORDER — ONDANSETRON HCL 4 MG PO TABS: 4.0000 mg | ORAL_TABLET | Freq: Four times a day (QID) | ORAL | Status: DC | PRN

## 2011-01-16 MED ORDER — IPRATROPIUM BROMIDE 0.02 % IN SOLN
0.5000 mg | Freq: Four times a day (QID) | RESPIRATORY_TRACT | Status: DC
  Administered 2011-01-16 – 2011-01-23 (×25): 0.5 mg via RESPIRATORY_TRACT
  Filled 2011-01-16 (×27): qty 2.5

## 2011-01-16 MED ORDER — GABAPENTIN 300 MG PO CAPS
300.0000 mg | ORAL_CAPSULE | Freq: Every day | ORAL | Status: DC
  Administered 2011-01-16 – 2011-01-23 (×8): 300 mg via ORAL
  Filled 2011-01-16 (×8): qty 1

## 2011-01-16 MED ORDER — ALBUTEROL SULFATE (5 MG/ML) 0.5% IN NEBU: 2.5000 mg | INHALATION_SOLUTION | RESPIRATORY_TRACT | Status: DC

## 2011-01-16 MED ORDER — ACETAMINOPHEN 325 MG PO TABS: 650.0000 mg | ORAL_TABLET | Freq: Four times a day (QID) | ORAL | Status: DC | PRN

## 2011-01-16 MED ORDER — CALCIUM CHLORIDE 10 % IV SOLN
1.0000 g | Freq: Once | INTRAVENOUS | Status: AC
  Administered 2011-01-16: 1 g via INTRAVENOUS
  Filled 2011-01-16: qty 10

## 2011-01-16 MED ORDER — ATORVASTATIN CALCIUM 40 MG PO TABS
40.0000 mg | ORAL_TABLET | Freq: Every day | ORAL | Status: DC
  Administered 2011-01-16 – 2011-01-20 (×4): 40 mg via ORAL
  Filled 2011-01-16 (×9): qty 1

## 2011-01-16 MED ORDER — LABETALOL HCL 200 MG PO TABS
100.0000 mg | ORAL_TABLET | Freq: Two times a day (BID) | ORAL | Status: DC
  Administered 2011-01-16 – 2011-01-24 (×17): 100 mg via ORAL
  Filled 2011-01-16 (×3): qty 1
  Filled 2011-01-16 (×2): qty 2
  Filled 2011-01-16 (×12): qty 1

## 2011-01-16 MED ORDER — RISPERIDONE 0.5 MG PO TABS
0.5000 mg | ORAL_TABLET | Freq: Every day | ORAL | Status: DC
  Administered 2011-01-16 – 2011-01-23 (×8): 0.5 mg via ORAL
  Filled 2011-01-16 (×9): qty 1

## 2011-01-16 MED ORDER — INFLUENZA VIRUS VACC SPLIT PF IM SUSP
0.5000 mL | Freq: Once | INTRAMUSCULAR | Status: AC
  Administered 2011-01-17: 0.5 mL via INTRAMUSCULAR

## 2011-01-16 MED ORDER — METHYLPREDNISOLONE SODIUM SUCC 125 MG IJ SOLR
125.0000 mg | Freq: Four times a day (QID) | INTRAMUSCULAR | Status: DC
  Administered 2011-01-16 – 2011-01-17 (×4): 125 mg via INTRAVENOUS
  Filled 2011-01-16 (×4): qty 2

## 2011-01-16 MED ORDER — SODIUM POLYSTYRENE SULFONATE 15 GM/60ML PO SUSP
30.0000 g | Freq: Once | ORAL | Status: AC
  Administered 2011-01-16: 30 g via ORAL
  Filled 2011-01-16: qty 120

## 2011-01-16 MED ORDER — GUAIFENESIN ER 600 MG PO TB12
1200.0000 mg | ORAL_TABLET | Freq: Two times a day (BID) | ORAL | Status: DC
  Administered 2011-01-16 – 2011-01-20 (×10): 1200 mg via ORAL
  Filled 2011-01-16 (×10): qty 2

## 2011-01-16 MED ORDER — ALBUTEROL SULFATE (5 MG/ML) 0.5% IN NEBU
2.5000 mg | INHALATION_SOLUTION | Freq: Four times a day (QID) | RESPIRATORY_TRACT | Status: DC
  Administered 2011-01-16 – 2011-01-23 (×25): 2.5 mg via RESPIRATORY_TRACT
  Filled 2011-01-16 (×26): qty 0.5

## 2011-01-16 MED ORDER — CITALOPRAM HYDROBROMIDE 20 MG PO TABS
20.0000 mg | ORAL_TABLET | Freq: Every day | ORAL | Status: DC
  Administered 2011-01-16 – 2011-01-24 (×8): 20 mg via ORAL
  Filled 2011-01-16 (×8): qty 1

## 2011-01-16 MED ORDER — ACETAMINOPHEN 650 MG RE SUPP
650.0000 mg | Freq: Four times a day (QID) | RECTAL | Status: DC | PRN
  Filled 2011-01-16: qty 1

## 2011-01-16 MED ORDER — ONDANSETRON HCL 4 MG/2ML IJ SOLN: 4.0000 mg | Freq: Four times a day (QID) | INTRAMUSCULAR | Status: DC | PRN

## 2011-01-16 MED ORDER — AMLODIPINE-ATORVASTATIN 10-40 MG PO TABS: 1.0000 | ORAL_TABLET | Freq: Every day | ORAL | Status: DC

## 2011-01-16 MED ORDER — ENOXAPARIN SODIUM 30 MG/0.3ML ~~LOC~~ SOLN
30.0000 mg | SUBCUTANEOUS | Status: DC
  Administered 2011-01-16 – 2011-01-24 (×8): 30 mg via SUBCUTANEOUS
  Filled 2011-01-16 (×8): qty 0.3

## 2011-01-16 NOTE — ED Notes (Signed)
Family and pt state pt has had generalized weakness, sob, wheezing, urinary retention, dysuria,  And edema. Pt was given 120mg  of lasix today and 75cc of urine output. Pt also had blood transfusion last week.

## 2011-01-16 NOTE — Progress Notes (Signed)
Physical Therapy Evaluation Patient Details Name: [name] MRN: [id number] DOB: 05/10/1940 Today's Date: 01/16/2011  Problem List:  Patient Active Problem List  Diagnoses  . Anemia of chronic disease  . DEPRESSION  . HYPERTENSION  . ASTHMA  . GERD  . ARTHRITIS  . SPASM, MUSCLE  . OSTEOPOROSIS  . DYSPHAGIA UNSPECIFIED  . SCHATZKI'S RING, HX OF  . Renal insufficiency  . Peripheral neuropathy  . Anemia  . B12 deficiency  . COPD (chronic obstructive pulmonary disease) with acute bronchitis  . Hypertension  . Chronic kidney disease  . Acute on chronic kidney failure  . Schizoaffective disorder    Past Medical History:  Past Medical History  Diagnosis Date  . Hypertension   . Hypercholesterolemia   . Anemia   . GERD (gastroesophageal reflux disease)   . Asthma   . Osteoporosis   . Chronic kidney disease   . Anemia of chronic disease 08/07/2007  . Renal insufficiency 08/03/2010  . OSTEOPOROSIS 08/07/2007  . Peripheral neuropathy 08/03/2010  . Schizoaffective disorder   . Arthritis    Past Surgical History:  Past Surgical History  Procedure Date  . Tubal ligation   . Breast biopsy   . Refractive surgery     right eye  . Bone marrow aspiration   . Bone marrow biopsy     PT Assessment/Plan/Recommendation PT Assessment Clinical Impression Statement: Pt with decreased activity tolerance who will benefit from skilled  therapy to improve safety of functional mobility PT Recommendation/Assessment: Patient will need skilled PT in the acute care venue PT Problem List: Decreased strength;Decreased activity tolerance PT Therapy Diagnosis : Difficulty walking;Generalized weakness PT Plan PT Frequency: Min 3X/week PT Treatment/Interventions: Therapeutic exercise;Gait training PT Recommendation Equipment Recommended: Rolling walker with 5" wheels PT Goals  Acute Rehab PT Goals PT Goal Formulation: With patient Time For Goal Achievement: 4 days Pt will go Supine/Side  to Sit: with modified independence Pt will go Sit to Supine/Side: with modified independence Pt will Ambulate: 51 - 150 feet  PT Evaluation Precautions/Restrictions  Precautions Precautions: Fall Restrictions Weight Bearing Restrictions: No Prior Functioning  Home Living Lives With: Family Type of Home: House Home Layout: One level Home Access: Level entry Bathroom Shower/Tub: Engineer, manufacturing systems: Standard Home Adaptive Equipment: Walker - rolling Prior Function Level of Independence: Independent with gait;Needs assistance with homemaking Vocation: Retired Financial risk analyst Arousal/Alertness: Awake/alert Sensation/Coordination Sensation Light Touch: Tax adviser Movements are Fluid and Coordinated: Yes Fine Motor Movements are Fluid and Coordinated: No Extremity Assessment RUE Assessment RUE Assessment: Not tested LUE Assessment LUE Assessment: Not tested RLE Assessment RLE Assessment: Exceptions to [facility] RLE Strength RLE Overall Strength: Due to premorbid status LLE Assessment LLE Assessment: Exceptions to [facility] LLE Strength LLE Overall Strength: Due to premorbid status Mobility (including Balance) Bed Mobility Bed Mobility: Yes Rolling Right: 6: Modified independent (Device/Increase time) Supine to Sit: 5: Supervision Sit to Supine - Right: 5: Supervision Scooting to HOB: 5: Supervision Transfers Transfers: Yes Sit to Stand: 5: Supervision Stand to Sit: 5: Supervision Ambulation/Gait Ambulation/Gait: Yes Ambulation Distance (Feet): 25 Feet Assistive device: Rolling walker Gait Pattern: Decreased step length - left;Decreased step length - right Gait velocity: slow Stairs: No Wheelchair Mobility Wheelchair Mobility: No  Balance Balance Assessed: No Exercise    End of Session PT - End of Session Equipment Utilized During Treatment: Gait belt Activity Tolerance: Patient tolerated treatment well Patient left: in  bed Nurse Communication: Mobility status for transfers General Behavior During Session:  WFL for tasks performed Cognition: [facility] for tasks performed  RUSSELL,CINDY 01/16/2011, 12:14 PM

## 2011-01-16 NOTE — H&P (Signed)
PCP:   Carylon Perches, MD   Chief Complaint:  Progressive shortness of breath and decreased urine output  HPI: [name] is an 71 y.o. African American female.   Multiple medical problems, including chronic kidney disease baseline creatinine about 4.5, chronic and yet recently requiring transfusion, and chronic bronchitis.  And having increasing cough and shortness of breath for the past week, felt it was related to fluid overload and has been taking increasing doses of Lasix, simultaneously noticed progressively decreasing urine.  Emergency room patient was noted to be hyperkalemic, creatinine greater than 6, he has received Kayexalate and the hospitalist service has been called to assist with management.  Has not had influenza vaccine this year Had no fever no runny nose.  Denies nausea vomiting or diarrhea. Increasing weakness  Rewiew of Systems:  The patient denies anorexia, fever, weight loss,, vision loss, decreased hearing, hoarseness, chest pain, syncope, dyspnea on exertion, peripheral edema, balance deficits, hemoptysis, abdominal pain, melena, hematochezia, severe indigestion/heartburn, hematuria, incontinence, genital sores, muscle weakness, suspicious skin lesions, transient blindness, difficulty walking, depression, unusual weight change, abnormal bleeding, enlarged lymph nodes, angioedema, and breast masses.   Past Medical History  Diagnosis Date  . Hypertension   . Hypercholesterolemia   . Anemia   . GERD (gastroesophageal reflux disease)   . Asthma   . Osteoporosis   . Chronic kidney disease   . Anemia of chronic disease 08/07/2007  . Renal insufficiency 08/03/2010  . OSTEOPOROSIS 08/07/2007  . Peripheral neuropathy 08/03/2010  . Schizoaffective disorder     Past Surgical History  Procedure Date  . Tubal ligation   . Breast biopsy   . Refractive surgery     right eye  . Bone marrow aspiration   . Bone marrow biopsy     Medications:  HOME MEDS: Prior  to Admission medications   Medication Sig Start Date End Date Taking? Authorizing Provider  Albuterol Sulfate (PROAIR HFA IN) Inhale into the lungs as needed.      Historical Provider, MD  ALPRAZolam Prudy Feeler) 0.5 MG tablet Take 1 tablet (0.5 mg total) by mouth 2 (two) times daily. 11/30/10   Syed T. Arfeen, MD  amLODipine-atorvastatin (CADUET) 10-40 MG per tablet Take 1 tablet by mouth daily.      Historical Provider, MD  calcium carbonate (TUMS - DOSED IN MG ELEMENTAL CALCIUM) 500 MG chewable tablet Chew 1 tablet by mouth as needed.      Historical Provider, MD  citalopram (CELEXA) 20 MG tablet Take 1 tablet (20 mg total) by mouth daily. 11/30/10   Syed T. Arfeen, MD  furosemide (LASIX) 40 MG tablet Take 40 mg by mouth 2 (two) times daily.      Historical Provider, MD  GABAPENTIN, PHN, PO Take 300 mg by mouth at bedtime.      Historical Provider, MD  HYDROcodone-acetaminophen (NORCO) 5-325 MG per tablet Take 1 tablet by mouth every 6 (six) hours as needed.      Historical Provider, MD  labetalol (NORMODYNE) 100 MG tablet Take 100 mg by mouth 2 (two) times daily.      Historical Provider, MD  lisinopril-hydrochlorothiazide (PRINZIDE,ZESTORETIC) 10-12.5 MG per tablet Take 1 tablet by mouth 2 (two) times daily.      Historical Provider, MD  potassium chloride (KLOR-CON) 10 MEQ CR tablet Take 10 mEq by mouth daily.      Historical Provider, MD  ramipril (ALTACE) 10 MG tablet Take 10 mg by mouth daily.      Historical Provider, MD  risperiDONE (RISPERDAL) 1 MG tablet Take 0.5 tablets (0.5 mg total) by mouth daily at 8 pm. 11/30/10   Syed T. Arfeen, MD  simvastatin (ZOCOR) 80 MG tablet Take 80 mg by mouth at bedtime.      Historical Provider, MD  traMADol (ULTRAM) 50 MG tablet Take 50 mg by mouth 2 (two) times daily.      Historical Provider, MD     Allergies:  Allergies  Allergen Reactions  . Ferrous Gluconate Nausea Only    hypotension    Social History:   reports that she has never smoked.  She does not have any smokeless tobacco history on file. She reports that she does not drink alcohol or use illicit drugs.  Family History: History reviewed. No pertinent family history.   Physical Exam: Filed Vitals:   01/16/11 0033 01/16/11 0040 01/16/11 0103 01/16/11 0303  BP: 144/84   144/71  Pulse: 72   71  Temp:  97.6 F (36.4 C)  98 F (36.7 C)  TempSrc:  Oral    Resp: 22   18  Height: 4\' 11"  (1.499 m)     Weight: 90.719 kg (200 lb)     SpO2: 95%  95% 93%   Blood pressure 144/71, pulse 71, temperature 98 F (36.7 C), temperature source Oral, resp. rate 18, height 4\' 11"  (1.499 m), weight 90.719 kg (200 lb), SpO2 93.00%.  GEN:  Pleasant elderly African American lady sitting up the stretcher coughing frequently; cooperative with exam PSYCH:  alert and oriented x;  flat affect HEENT: Mucous membranes pink dry and anicteric;  Breasts:: Not examined CHEST WALL: No tenderness CHEST: Diffuse bilateral rhonchi; prolonged expiration HEART: Regular rate and rhythm; no murmurs rubs or gallops BACK: No kyphosis or scoliosis; no CVA tenderness ABDOMEN: Obese, soft non-tender; no masses, no organomegaly, normal abdominal bowel sounds;  no intertriginous candida. Rectal Exam: Not done EXTREMITIES: Extensive arthropathy of the hands and knees; trace edema; wearing bilateral compression hose Genitalia: not examined PULSES: 2+ and symmetric SKIN: Normal hydration no rash or ulceration seen CNS: Cranial nerves 2-12 grossly intact no focal neurologic deficit   Labs & Imaging Results for orders placed during the hospital encounter of 01/16/11 (from the past 48 hour(s))  CBC     Status: Abnormal   Collection Time   01/16/11  1:12 AM      Component Value Range Comment   WBC 7.5  4.0 - 10.5 (K/uL)    RBC 3.19 (*) 3.87 - 5.11 (MIL/uL)    Hemoglobin 8.8 (*) 12.0 - 15.0 (g/dL)    HCT 21.3 (*) 08.6 - 46.0 (%)    MCV 87.5  78.0 - 100.0 (fL)    MCH 27.6  26.0 - 34.0 (pg)    MCHC 31.5  30.0  - 36.0 (g/dL)    RDW 57.8 (*) 46.9 - 15.5 (%)    Platelets 202  150 - 400 (K/uL)   DIFFERENTIAL     Status: Normal   Collection Time   01/16/11  1:12 AM      Component Value Range Comment   Neutrophils Relative 71  43 - 77 (%)    Neutro Abs 5.4  1.7 - 7.7 (K/uL)    Lymphocytes Relative 19  12 - 46 (%)    Lymphs Abs 1.4  0.7 - 4.0 (K/uL)    Monocytes Relative 8  3 - 12 (%)    Monocytes Absolute 0.6  0.1 - 1.0 (K/uL)    Eosinophils Relative 2  0 - 5 (%)    Eosinophils Absolute 0.2  0.0 - 0.7 (K/uL)    Basophils Relative 0  0 - 1 (%)    Basophils Absolute 0.0  0.0 - 0.1 (K/uL)   COMPREHENSIVE METABOLIC PANEL     Status: Abnormal   Collection Time   01/16/11  1:12 AM      Component Value Range Comment   Sodium 125 (*) 135 - 145 (mEq/L)    Potassium 6.2 (*) 3.5 - 5.1 (mEq/L)    Chloride 96  96 - 112 (mEq/L)    CO2 17 (*) 19 - 32 (mEq/L)    Glucose, Bld 111 (*) 70 - 99 (mg/dL)    BUN 67 (*) 6 - 23 (mg/dL)    Creatinine, Ser 1.61 (*) 0.50 - 1.10 (mg/dL)    Calcium 5.5 (*) 8.4 - 10.5 (mg/dL)    Total Protein 6.9  6.0 - 8.3 (g/dL)    Albumin 3.1 (*) 3.5 - 5.2 (g/dL)    AST 12  0 - 37 (U/L)    ALT 9  0 - 35 (U/L)    Alkaline Phosphatase 47  39 - 117 (U/L)    Total Bilirubin 0.2 (*) 0.3 - 1.2 (mg/dL)    GFR calc non Af Amer 6 (*) >90 (mL/min)    GFR calc Af Amer 6 (*) >90 (mL/min)   AMMONIA     Status: Normal   Collection Time   01/16/11  1:12 AM      Component Value Range Comment   Ammonia 26  11 - 60 (umol/L)   LIPASE, BLOOD     Status: Abnormal   Collection Time   01/16/11  1:12 AM      Component Value Range Comment   Lipase 68 (*) 11 - 59 (U/L)   GLUCOSE, CAPILLARY     Status: Normal   Collection Time   01/16/11  1:16 AM      Component Value Range Comment   Glucose-Capillary 99  70 - 99 (mg/dL)   URINALYSIS, ROUTINE W REFLEX MICROSCOPIC     Status: Abnormal   Collection Time   01/16/11  1:27 AM      Component Value Range Comment   Color, Urine YELLOW  YELLOW     APPearance CLEAR   CLEAR     Specific Gravity, Urine 1.020  1.005 - 1.030     pH 5.0  5.0 - 8.0     Glucose, UA NEGATIVE  NEGATIVE (mg/dL)    Hgb urine dipstick TRACE (*) NEGATIVE     Bilirubin Urine NEGATIVE  NEGATIVE     Ketones, ur NEGATIVE  NEGATIVE (mg/dL)    Protein, ur 096 (*) NEGATIVE (mg/dL)    Urobilinogen, UA 0.2  0.0 - 1.0 (mg/dL)    Nitrite POSITIVE (*) NEGATIVE     Leukocytes, UA NEGATIVE  NEGATIVE    URINE MICROSCOPIC-ADD ON     Status: Abnormal   Collection Time   01/16/11  1:27 AM      Component Value Range Comment   Squamous Epithelial / LPF RARE  RARE     WBC, UA 0-2  <3 (WBC/hpf)    RBC / HPF 0-2  <3 (RBC/hpf)    Bacteria, UA FEW (*) RARE     Dg Abd Acute W/chest  01/16/2011  *RADIOLOGY REPORT*  Clinical Data: Abdominal distention.  Cough and wheezing.  ACUTE ABDOMEN SERIES (ABDOMEN 2 VIEW & CHEST 1 VIEW)  Comparison: Scout image for CT scan dated  05/22/2005  Findings: The patient has marked cardiomegaly which is new since the prior CT scan dated 05/22/2005.  Pulmonary vascular prominence without effusions or infiltrates.  The carina is slightly splayed which could be due to enlargement of the left atrium but adenopathy can give this appearance as well.  No free air under the diaphragm.  Bowel gas pattern is normal. Overall density of the abdomen is increased suggesting ascites.  IMPRESSION:  1.  Probable ascites. 2.  New cardiomegaly with either enlargement of the left atrium or subcarinal adenopathy.  Original Report Authenticated By: Gwynn Burly, M.D.      Assessment Present on Admission:  .Acute on chronic kidney failure . hyperkalemia due to the above  COPD (chronic obstructive pulmonary disease) with acute bronchitis .Anemia of chronic disease .ARTHRITIS .GERD .Chronic kidney disease .HYPERTENSION  .Schizoaffective disorder  PLAN: Cautious hydration; consult nephrologist for assistance with management; Agree with Kayexalate; hold potassium and Lasix;  She has 2  different ACE inhibitors listed on in her medical records and we'll hold both of these for the time being  Other plans as per orders.   Alyssabeth Bruster 01/16/2011, 6:41 AM

## 2011-01-16 NOTE — ED Provider Notes (Signed)
History     CSN: [id number]  Arrival date & time 01/16/11  0014   First MD Initiated Contact with Patient 01/16/11 0026      Chief Complaint  Patient presents with  . Shortness of Breath  . Wheezing  . Urinary Retention  . Dysuria  . Altered Mental Status  . Edema    (Consider location/radiation/quality/duration/timing/severity/associated sxs/prior treatment) HPI Comments: 71 year old female, history of hypertension, hypercholesterol, anemia and GERD, chronic kidney disease and swelling of the lower extremities who takes Lasix for the swelling. She also receives routine blood transfusions of which she had 2 units of packed red blood cells this week. Family accompanies the patient states that over the last 24 hours she has had increased abdominal swelling and decreased urinary frequency, decreased urinary output over 3 days. They state that sometimes she appears confused and is having some wheezing. She denies fevers chills nausea vomiting diarrhea or rash headache or blurred vision. Symptoms are constant, gradually getting worse, nothing makes better or worse. The family member states that she did forget to take her dose of Lasix last night  Patient is a 71 y.o. female presenting with shortness of breath, wheezing, dysuria, and altered mental status. The history is provided by the patient, a relative and medical records.  Shortness of Breath  Associated symptoms include shortness of breath and wheezing.  Wheezing  Associated symptoms include shortness of breath and wheezing.  Dysuria   Altered Mental Status Associated symptoms include shortness of breath.    Past Medical History  Diagnosis Date  . Hypertension   . Hypercholesterolemia   . Anemia   . GERD (gastroesophageal reflux disease)   . Asthma   . Osteoporosis   . Chronic kidney disease   . Anemia of chronic disease 08/07/2007  . Renal insufficiency 08/03/2010  . OSTEOPOROSIS 08/07/2007  . Peripheral neuropathy 08/03/2010     Past Surgical History  Procedure Date  . Tubal ligation   . Breast biopsy   . Refractive surgery     right eye  . Bone marrow aspiration   . Bone marrow biopsy     History reviewed. No pertinent family history.  History  Substance Use Topics  . Smoking status: Never Smoker   . Smokeless tobacco: Not on file  . Alcohol Use: No    OB History    Grav Para Term Preterm Abortions TAB SAB Ect Mult Living                  Review of Systems  Respiratory: Positive for shortness of breath and wheezing.   Genitourinary: Positive for dysuria.  Psychiatric/Behavioral: Positive for altered mental status.  All other systems reviewed and are negative.    Allergies  Ferrous gluconate  Home Medications   Current Outpatient Rx  Name Route Sig Dispense Refill  . PROAIR HFA IN Inhalation Inhale into the lungs as needed.      . ALPRAZOLAM 0.5 MG PO TABS Oral Take 1 tablet (0.5 mg total) by mouth 2 (two) times daily. 60 tablet 2  . AMLODIPINE-ATORVASTATIN 10-40 MG PO TABS Oral Take 1 tablet by mouth daily.      Marland Kitchen CALCIUM CARBONATE ANTACID 500 MG PO CHEW Oral Chew 1 tablet by mouth as needed.      Marland Kitchen CITALOPRAM HYDROBROMIDE 20 MG PO TABS Oral Take 1 tablet (20 mg total) by mouth daily. 30 tablet 2  . FUROSEMIDE 40 MG PO TABS Oral Take 40 mg by mouth 2 (two) times  daily.      Marland Kitchen GABAPENTIN (PHN) PO Oral Take 300 mg by mouth at bedtime.      Marland Kitchen HYDROCODONE-ACETAMINOPHEN 5-325 MG PO TABS Oral Take 1 tablet by mouth every 6 (six) hours as needed.      Marland Kitchen LABETALOL HCL 100 MG PO TABS Oral Take 100 mg by mouth 2 (two) times daily.      Marland Kitchen LISINOPRIL-HYDROCHLOROTHIAZIDE 10-12.5 MG PO TABS Oral Take 1 tablet by mouth 2 (two) times daily.      Marland Kitchen POTASSIUM CHLORIDE 10 MEQ PO TBCR Oral Take 10 mEq by mouth daily.      Marland Kitchen RAMIPRIL 10 MG PO TABS Oral Take 10 mg by mouth daily.      Marland Kitchen RISPERIDONE 1 MG PO TABS Oral Take 0.5 tablets (0.5 mg total) by mouth daily at 8 pm. 30 tablet 2  . SIMVASTATIN 80 MG  PO TABS Oral Take 80 mg by mouth at bedtime.      . TRAMADOL HCL 50 MG PO TABS Oral Take 50 mg by mouth 2 (two) times daily.        BP 144/71  Pulse 71  Temp(Src) 98 F (36.7 C) (Oral)  Resp 18  Ht 4\' 11"  (1.499 m)  Wt 200 lb (90.719 kg)  BMI 40.39 kg/m2  SpO2 93%  Physical Exam  Nursing note and vitals reviewed. Constitutional: She appears well-developed and well-nourished. No distress.  HENT:  Head: Normocephalic and atraumatic.  Mouth/Throat: Oropharynx is clear and moist. No oropharyngeal exudate.  Eyes: Conjunctivae and EOM are normal. Pupils are equal, round, and reactive to light. Right eye exhibits no discharge. Left eye exhibits no discharge. No scleral icterus.  Neck: Normal range of motion. Neck supple. No JVD present. No thyromegaly present.  Cardiovascular: Normal rate, regular rhythm, normal heart sounds and intact distal pulses.  Exam reveals no gallop and no friction rub.   No murmur heard. Pulmonary/Chest: Effort normal. No respiratory distress. She has wheezes ( mild end expiratory wheezing). She has no rales.  Abdominal: Soft. Bowel sounds are normal. She exhibits distension ( mild diffuse distention, no tympanitic sounds). She exhibits no mass. There is tenderness ( mild suprapubic tenderness, non-peritoneal). There is no guarding.       No pain in the upper abdomen, right upper quadrant, right lower quadrant. No guarding  Musculoskeletal: Normal range of motion. She exhibits edema ( mild bilateral pitting edema). She exhibits no tenderness.  Lymphadenopathy:    She has no cervical adenopathy.  Neurological: She is alert. Coordination normal.  Skin: Skin is warm and dry. No rash noted. No erythema.  Psychiatric: She has a normal mood and affect. Her behavior is normal.    ED Course  Procedures (including critical care time)  Labs Reviewed  CBC - Abnormal; Notable for the following:    RBC 3.19 (*)    Hemoglobin 8.8 (*)    HCT 27.9 (*)    RDW 19.1 (*)     All other components within normal limits  COMPREHENSIVE METABOLIC PANEL - Abnormal; Notable for the following:    Sodium 125 (*)    Potassium 6.2 (*)    CO2 17 (*)    Glucose, Bld 111 (*)    BUN 67 (*)    Creatinine, Ser 6.83 (*)    Calcium 5.5 (*)    Albumin 3.1 (*)    Total Bilirubin 0.2 (*)    GFR calc non Af Amer 6 (*)    GFR calc Af  Amer 6 (*)    All other components within normal limits  URINALYSIS, ROUTINE W REFLEX MICROSCOPIC - Abnormal; Notable for the following:    Hgb urine dipstick TRACE (*)    Protein, ur 100 (*)    Nitrite POSITIVE (*)    All other components within normal limits  LIPASE, BLOOD - Abnormal; Notable for the following:    Lipase 68 (*)    All other components within normal limits  URINE MICROSCOPIC-ADD ON - Abnormal; Notable for the following:    Bacteria, UA FEW (*)    All other components within normal limits  DIFFERENTIAL  AMMONIA  GLUCOSE, CAPILLARY  URINE CULTURE  POCT CBG MONITORING   Dg Abd Acute W/chest  01/16/2011  *RADIOLOGY REPORT*  Clinical Data: Abdominal distention.  Cough and wheezing.  ACUTE ABDOMEN SERIES (ABDOMEN 2 VIEW & CHEST 1 VIEW)  Comparison: Scout image for CT scan dated 05/22/2005  Findings: The patient has marked cardiomegaly which is new since the prior CT scan dated 05/22/2005.  Pulmonary vascular prominence without effusions or infiltrates.  The carina is slightly splayed which could be due to enlargement of the left atrium but adenopathy can give this appearance as well.  No free air under the diaphragm.  Bowel gas pattern is normal. Overall density of the abdomen is increased suggesting ascites.  IMPRESSION:  1.  Probable ascites. 2.  New cardiomegaly with either enlargement of the left atrium or subcarinal adenopathy.  Original Report Authenticated By: Gwynn Burly, M.D.     1. Renal failure   2. Hyponatremia   3. Hyperkalemia       MDM  No fever or tachycardia, she has mild tachypnea with wheezing and  distended abdomen with decreased urinary frequency. We'll place a Foley catheter to drain the bladder, send urine sample and measure urinary output. Check renal function   ED ECG REPORT   Date: 01/16/2011   Rate: 73  Rhythm: normal sinus rhythm  QRS Axis: left  Intervals: QT prolonged  ST/T Wave abnormalities: normal  Conduction Disutrbances:left anterior fascicular block  Narrative Interpretation: flattening of P waves present  Old EKG Reviewed: none available   Electrolyte abnormalities seen, Kayexalate dose to, is in acute on chronic renal failure with hyponatremia and hyperkalemia. Vital signs otherwise appears stable at this time, chest x-ray with no findings of pulmonary edema.  Discussed case with the hospitalist Dr. Orvan Falconer who agrees with admission  Vida Roller, MD 01/16/11 [phone]

## 2011-01-16 NOTE — Progress Notes (Signed)
[name], [name]               ACCOUNT NO.:  [id number]  MEDICAL RECORD NO.:  [id number]  LOCATION:  A202                          FACILITY:  [facility]  PHYSICIAN:  Kingsley Callander. [name], MD       DATE OF BIRTH:  Feb 22, 1940  DATE OF PROCEDURE: DATE OF DISCHARGE:                                PROGRESS NOTE   [name] was admitted to the hospital last night after experiencing wheezing and shortness of breath.  She had noted diminished urinary output.  She had been discovered to have hyperkalemia and hyponatremia as well as worsening of her chronic kidney disease.  She was treated for her hyperkalemia and was started on IV fluids as well as nebulizers and corticosteroids.  She has a history of smoking for over 40 years, does not smoke since 2000.  She has used home nebulizers and a ProAir metered- dose inhaler.  She had recently noted some swelling in her face, abdomen, and legs.  She has been followed by Nephrology and Hematology. I had actually not seen her in the recent past; however, she was scheduled to seen me in the office later this week.  She had been transfused for hemoglobin of 7.5 back on January 03, 2011.  This morning, she notes less swelling.  She is afebrile with temperature of 97.9, pulse 89, respirations 20, blood pressure 154/97, oxygen saturation is 93% on room air.  She denies coughing up any purulent mucus, but does state that she had a bout of pneumonia back in November. Lungs reveal bilateral wheezes.  Heart regular with no murmurs.  Abdomen is overweight and nontender with no palpable organomegaly.  Extremities revealed no edema.  Neuro at baseline.  IMPRESSION/PLAN: 1. Chronic obstructive pulmonary disease, treat with Ventolin and     Atrovent nebulizer treatments and we will continue IV Solu-Medrol,     which was started by the hospitalist who admitted the patient last     night.  She is not producing any purulent sputum and has no     evidence of pneumonia on  her chest x-ray. 2. Chronic kidney disease.  BUN and creatinine have climbed to 67 and     6.83.  Lasix will be held today.  Nephrology will be consulted. 3. Hyperkalemia.  Serum potassium is 6.2.  As noted this has been     treated with Kayexalate, calcium and fluids, repeat level is     pending.  It appears she had been taking lisinopril HCT and     ramipril, both of these will be discontinued.  She had also been on     potassium supplementation, which will be discontinued.  I am     unclear as to how she came to be taking her current     antihypertensive therapy. 4. Anemia.  Transfusions had been ordered; however, her hemoglobin is     8.8.  So, I believe the transfusions can be held for now.  She has     been followed closely by Hematology. 5. Hypocalcemia.  Serum calcium was 5.5 on admission. 6. Hypertension.  We will continue treatment for now with amlodipine     and labetalol.  7. Schizoaffective disorder.  Continue risperidone and continue     citalopram. 8. History of peripheral neuropathy.  She is on gabapentin 300 mg at     bedtime. 9. Hyperlipidemia.  She has been treated with simvastatin at home, but     will need a lower dose based on her concomitant amlodipine use.     Her home medication list includes both simvastatin and     atorvastatin.  She will be modified to atorvastatin at home. 10.Osteoarthritis.  She has used tramadol at home for pain, which will     be continued here. 11.Possible ascites.  Her chest x-ray suggest possible ascites     according to the radiologist.  An ultrasound will be obtained to     evaluate for this.     Kingsley Callander. [name], MD     ROF/MEDQ  D:  01/16/2011  T:  01/16/2011  Job:  [id number]

## 2011-01-16 NOTE — ED Notes (Signed)
Dr Campbell to bedside.

## 2011-01-17 LAB — HEPATITIS B CORE ANTIBODY, IGM: Hep B C IgM: NEGATIVE

## 2011-01-17 LAB — BASIC METABOLIC PANEL
Calcium: 5.7 mg/dL — CL (ref 8.4–10.5)
Chloride: 96 mEq/L (ref 96–112)
Creatinine, Ser: 7.1 mg/dL — ABNORMAL HIGH (ref 0.50–1.10)
GFR calc Af Amer: 6 mL/min — ABNORMAL LOW (ref >90)
Sodium: 125 mEq/L — ABNORMAL LOW (ref 135–145)

## 2011-01-17 LAB — URINE CULTURE

## 2011-01-17 LAB — CBC
Platelets: 200 10*3/uL (ref 150–400)
RBC: 3.15 MIL/uL — ABNORMAL LOW (ref 3.87–5.11)
RDW: 18.8 % — ABNORMAL HIGH (ref 11.5–15.5)
WBC: 6.7 10*3/uL (ref 4.0–10.5)

## 2011-01-17 LAB — COMPREHENSIVE METABOLIC PANEL
BUN: 73 mg/dL — ABNORMAL HIGH (ref 6–23)
Calcium: 5.6 mg/dL — CL (ref 8.4–10.5)
Creatinine, Ser: 6.96 mg/dL — ABNORMAL HIGH (ref 0.50–1.10)
GFR calc Af Amer: 6 mL/min — ABNORMAL LOW (ref >90)
Glucose, Bld: 224 mg/dL — ABNORMAL HIGH (ref 70–99)
Total Protein: 6.4 g/dL (ref 6.0–8.3)

## 2011-01-17 MED ORDER — METHYLPREDNISOLONE SODIUM SUCC 40 MG IJ SOLR
40.0000 mg | Freq: Two times a day (BID) | INTRAMUSCULAR | Status: DC
  Administered 2011-01-17 – 2011-01-18 (×2): 40 mg via INTRAVENOUS
  Filled 2011-01-17 (×2): qty 1

## 2011-01-17 MED ORDER — SODIUM CHLORIDE 0.9 % IV SOLN
INTRAVENOUS | Status: DC
  Administered 2011-01-17: 13:00:00 via INTRAVENOUS

## 2011-01-17 MED ORDER — FUROSEMIDE 10 MG/ML IJ SOLN
120.0000 mg | Freq: Two times a day (BID) | INTRAVENOUS | Status: DC
  Administered 2011-01-17 (×2): 120 mg via INTRAVENOUS
  Filled 2011-01-17 (×4): qty 12

## 2011-01-17 MED ORDER — EPOETIN ALFA 10000 UNIT/ML IJ SOLN
10000.0000 [IU] | Freq: Once | INTRAMUSCULAR | Status: DC
  Filled 2011-01-17 (×2): qty 1

## 2011-01-17 MED ORDER — CALCIUM CARBONATE ANTACID 500 MG PO CHEW
1.0000 | CHEWABLE_TABLET | Freq: Three times a day (TID) | ORAL | Status: DC
  Administered 2011-01-17 – 2011-01-18 (×6): 200 mg via ORAL
  Filled 2011-01-17 (×5): qty 1

## 2011-01-17 MED ORDER — ALBUTEROL SULFATE (5 MG/ML) 0.5% IN NEBU: 2.5000 mg | INHALATION_SOLUTION | RESPIRATORY_TRACT | Status: DC | PRN

## 2011-01-17 NOTE — Progress Notes (Signed)
[name], [name]               ACCOUNT NO.:  [id number]  MEDICAL RECORD NO.:  [id number]  LOCATION:  A202                          FACILITY:  [facility]  PHYSICIAN:  Kingsley Callander. [name], MD       DATE OF BIRTH:  [birth year]  DATE OF PROCEDURE:  01/17/2011 DATE OF DISCHARGE:                                PROGRESS NOTE   [name] feels better today.  Her wheezing and shortness of breath are much improved.  She has not had any fever or significant cough.  She worked with the physical therapist yesterday and was able to walk with a walker.  She has had 900 mL urine output.  A Foley had been placed.  Her ultrasound of the abdomen revealed no evidence of ascites.  Bilateral renal cysts were present as was a 3.6 cm infrarenal abdominal aortic aneurysm.  The left kidney measured 9.5 cm in length and the right kidney measured 9.8 cm in length.  PHYSICAL EXAMINATION:  VITAL SIGNS:  Temperature 98.3, pulse 81, respirations 22, blood pressure 148/73, oxygen saturation is 96% on room air. LUNGS:  Reveal diminished breath sounds, but resolution of her wheezes. HEART:  Regular with no murmurs. ABDOMEN:  Soft and nontender. EXTREMITIES:  Reveal no edema. NEURO:  At baseline.  IMPRESSION/PLAN: 1. Chronic obstructive pulmonary disease exacerbation, improving.     Decrease Solu-Medrol to 40 mg IV q.12 h.  Continue nebulizers. 2. Hyperkalemia resolved after treatment.  Serum potassium today is     down to 4.7. 3. Hyponatremia stable at 125. 4. Hypocalcemia.  Calcium is 5.7.  Calcium carbonate will be readded. 5. Chronic kidney disease.  BUN and creatinine are 72 and 7.1.  Dr.     [name] is being consulted. 6. Anemia.  Hemoglobin is stable at 8.7.     Kingsley Callander. [name], MD     ROF/MEDQ  D:  01/17/2011  T:  01/17/2011  Job:  [id number]

## 2011-01-17 NOTE — Consult Note (Signed)
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 Patient also his history of CHF. Referring Physician: Dr. Durenda Guthrie [name] is an 71 y.o. female.  HPI: She is a patient with chronic renal failure stage IV presently was brought to the hospital because of difficulty in breathing and the also wheezing. Presently patient says that she is feeling better after she received the inhaler. Patient denies any nausea vomiting however her appetite is poor. She states that today she'll have any difficulty breathing and also she does not have any orthopnea.  Past Medical History  Diagnosis Date  . Hypertension   . Hypercholesterolemia   . Anemia   . GERD (gastroesophageal reflux disease)   . Asthma   . Osteoporosis   . Chronic kidney disease   . Anemia of chronic disease 08/07/2007  . Renal insufficiency 08/03/2010  . OSTEOPOROSIS 08/07/2007  . Peripheral neuropathy 08/03/2010  . Schizoaffective disorder   . Arthritis     Past Surgical History  Procedure Date  . Tubal ligation   . Breast biopsy   . Refractive surgery     right eye  . Bone marrow aspiration   . Bone marrow biopsy     History reviewed. No pertinent family history.  Social History:  reports that she has never smoked. She does not have any smokeless tobacco history on file. She reports that she does not drink alcohol or use illicit drugs.  Allergies:  Allergies  Allergen Reactions  . Ferrous Gluconate Nausea Only    hypotension    Medications: I have reviewed the patient's current medications.  Results for orders placed during the hospital encounter of 01/16/11 (from the past 48 hour(s))  CBC     Status: Abnormal   Collection Time   01/16/11  1:12 AM      Component Value Range Comment   WBC 7.5  4.0 - 10.5 (K/uL)    RBC 3.19 (*) 3.87 - 5.11 (MIL/uL)    Hemoglobin 8.8 (*) 12.0 - 15.0 (g/dL)    HCT 16.1 (*) 09.6 - 46.0 (%)    MCV 87.5  78.0 - 100.0 (fL)    MCH 27.6  26.0 - 34.0 (pg)    MCHC  31.5  30.0 - 36.0 (g/dL)    RDW 04.5 (*) 40.9 - 15.5 (%)    Platelets 202  150 - 400 (K/uL)   DIFFERENTIAL     Status: Normal   Collection Time   01/16/11  1:12 AM      Component Value Range Comment   Neutrophils Relative 71  43 - 77 (%)    Neutro Abs 5.4  1.7 - 7.7 (K/uL)    Lymphocytes Relative 19  12 - 46 (%)    Lymphs Abs 1.4  0.7 - 4.0 (K/uL)    Monocytes Relative 8  3 - 12 (%)    Monocytes Absolute 0.6  0.1 - 1.0 (K/uL)    Eosinophils Relative 2  0 - 5 (%)    Eosinophils Absolute 0.2  0.0 - 0.7 (K/uL)    Basophils Relative 0  0 - 1 (%)    Basophils Absolute 0.0  0.0 - 0.1 (K/uL)   COMPREHENSIVE METABOLIC PANEL     Status: Abnormal   Collection Time   01/16/11  1:12 AM      Component Value Range Comment   Sodium 125 (*) 135 - 145 (mEq/L)    Potassium 6.2 (*) 3.5 - 5.1 (mEq/L)    Chloride 96  96 - 112 (mEq/L)    CO2 17 (*) 19 - 32 (mEq/L)    Glucose, Bld 111 (*) 70 - 99 (mg/dL)    BUN 67 (*) 6 - 23 (mg/dL)    Creatinine, Ser 4.09 (*) 0.50 - 1.10 (mg/dL)    Calcium 5.5 (*) 8.4 - 10.5 (mg/dL)    Total Protein 6.9  6.0 - 8.3 (g/dL)    Albumin 3.1 (*) 3.5 - 5.2 (g/dL)    AST 12  0 - 37 (U/L)    ALT 9  0 - 35 (U/L)    Alkaline Phosphatase 47  39 - 117 (U/L)    Total Bilirubin 0.2 (*) 0.3 - 1.2 (mg/dL)    GFR calc non Af Amer 6 (*) >90 (mL/min)    GFR calc Af Amer 6 (*) >90 (mL/min)   AMMONIA     Status: Normal   Collection Time   01/16/11  1:12 AM      Component Value Range Comment   Ammonia 26  11 - 60 (umol/L)   LIPASE, BLOOD     Status: Abnormal   Collection Time   01/16/11  1:12 AM      Component Value Range Comment   Lipase 68 (*) 11 - 59 (U/L)   GLUCOSE, CAPILLARY     Status: Normal   Collection Time   01/16/11  1:16 AM      Component Value Range Comment   Glucose-Capillary 99  70 - 99 (mg/dL)   URINALYSIS, ROUTINE W REFLEX MICROSCOPIC     Status: Abnormal   Collection Time   01/16/11  1:27 AM      Component Value Range Comment   Color, Urine YELLOW  YELLOW      APPearance CLEAR  CLEAR     Specific Gravity, Urine 1.020  1.005 - 1.030     pH 5.0  5.0 - 8.0     Glucose, UA NEGATIVE  NEGATIVE (mg/dL)    Hgb urine dipstick TRACE (*) NEGATIVE     Bilirubin Urine NEGATIVE  NEGATIVE     Ketones, ur NEGATIVE  NEGATIVE (mg/dL)    Protein, ur 811 (*) NEGATIVE (mg/dL)    Urobilinogen, UA 0.2  0.0 - 1.0 (mg/dL)    Nitrite POSITIVE (*) NEGATIVE     Leukocytes, UA NEGATIVE  NEGATIVE    URINE MICROSCOPIC-ADD ON     Status: Abnormal   Collection Time   01/16/11  1:27 AM      Component Value Range Comment   Squamous Epithelial / LPF RARE  RARE     WBC, UA 0-2  <3 (WBC/hpf)    RBC / HPF 0-2  <3 (RBC/hpf)    Bacteria, UA FEW (*) RARE    BASIC METABOLIC PANEL     Status: Abnormal   Collection Time   01/17/11  6:04 AM      Component Value Range Comment   Sodium 125 (*) 135 - 145 (mEq/L)    Potassium 4.7  3.5 - 5.1 (mEq/L) DELTA CHECK NOTED   Chloride 96  96 - 112 (mEq/L)    CO2 17 (*) 19 - 32 (mEq/L)    Glucose, Bld 178 (*) 70 - 99 (mg/dL)    BUN 72 (*) 6 - 23 (mg/dL)    Creatinine, Ser 9.14 (*) 0.50 - 1.10 (mg/dL)    Calcium 5.7 (*) 8.4 - 10.5 (mg/dL)    GFR calc non Af Amer 5 (*) >90 (mL/min)    GFR calc Af Denyse Dago  6 (*) >90 (mL/min)   CBC     Status: Abnormal   Collection Time   01/17/11  6:04 AM      Component Value Range Comment   WBC 6.7  4.0 - 10.5 (K/uL)    RBC 3.15 (*) 3.87 - 5.11 (MIL/uL)    Hemoglobin 8.7 (*) 12.0 - 15.0 (g/dL)    HCT 04.5 (*) 40.9 - 46.0 (%)    MCV 86.0  78.0 - 100.0 (fL)    MCH 27.6  26.0 - 34.0 (pg)    MCHC 32.1  30.0 - 36.0 (g/dL)    RDW 81.1 (*) 91.4 - 15.5 (%)    Platelets 200  150 - 400 (K/uL)     US Abdomen Complete  01/16/2011  *RADIOLOGY REPORT*  Clinical Data:  Question of ascites.  History of hypertension, elevated cholesterol.  Obesity.  COMPLETE ABDOMINAL ULTRASOUND  Comparison:  Renal ultrasound 04/03/2007, CT of the abdomen and pelvis 05/22/2005  Findings:  Gallbladder:  The gallbladder wall is upper limits  normal, 2.8 mm. No sonographic Murphy's sign.  No stones.  No pericholecystic fluid.  Common bile duct:  Common bile duct is normal in caliber, 3.8 mm.  Liver:  The liver is mildly echogenic without focal lesion.  Normal sized.  IVC:  Appears normal.  Pancreas:  The pancreas is not well seen because of overlying bowel gas.  Spleen:  The spleen has a normal appearance and measures 7.7 cm.  Right Kidney:  The right kidney is echogenic and is 9.8 cm in length.  A cyst in the midpole region is 1.9 x 2.0 x 2.1 cm.  Left Kidney:  The left kidney is 9.5 cm in length and is mildly echogenic.  Multiple cysts are identified.  The largest is 6.8 x 6.7 x 6.4 cm.  Abdominal aorta:  The abdominal aorta is mildly aneurysmal, 3.6 cm. Intimal plaque is identified.  On prior CT exam, maximum infrarenal diameter was 2.7 x 2.9 cm.  Additional findings:  No ascites.  IMPRESSION:  1.  No evidence for acute cholecystitis. 2.  Bilateral renal cysts. 3.  Infrarenal abdominal aortic aneurysm, 3.6 cm.  This has increased slightly since prior study in 2007.  Original Report Authenticated By: Patterson Hammersmith, M.D.   Dg Abd Acute W/chest  01/16/2011  *RADIOLOGY REPORT*  Clinical Data: Abdominal distention.  Cough and wheezing.  ACUTE ABDOMEN SERIES (ABDOMEN 2 VIEW & CHEST 1 VIEW)  Comparison: [facility] image for CT scan dated 05/22/2005  Findings: The patient has marked cardiomegaly which is new since the prior CT scan dated 05/22/2005.  Pulmonary vascular prominence without effusions or infiltrates.  The carina is slightly splayed which could be due to enlargement of the left atrium but adenopathy can give this appearance as well.  No free air under the diaphragm.  Bowel gas pattern is normal. Overall density of the abdomen is increased suggesting ascites.  IMPRESSION:  1.  Probable ascites. 2.  New cardiomegaly with either enlargement of the left atrium or subcarinal adenopathy.  Original Report Authenticated By: Gwynn Burly, M.D.     Review of Systems  HENT: Positive for congestion.   Respiratory: Positive for shortness of breath and wheezing.   Cardiovascular: Positive for PND.  Gastrointestinal: Negative for nausea, vomiting and abdominal pain.  Neurological: Positive for weakness.   Blood pressure 148/73, pulse 81, temperature 98.3 F (36.8 C), temperature source Oral, resp. rate 22, height 4\' 11"  (1.499 m), weight 96.026 kg (211 lb 11.2  oz), SpO2 96.00%. Physical Exam  Constitutional: She is oriented to person, place, and time.  Eyes: No scleral icterus.  Neck: No JVD present.  Cardiovascular: Normal rate, regular rhythm and normal heart sounds.  Exam reveals no gallop.   No murmur heard. Respiratory: Effort normal. She has wheezes. She exhibits no tenderness.       Decreased breath sound bilaterally. She doesn't have any egophony.  GI: Bowel sounds are normal. There is no rebound.       Abdomen is full, nontender, no palpable organomegaly.  Musculoskeletal: She exhibits edema.       Trace  Neurological: She is alert and oriented to person, place, and time.  Skin: Skin is dry.    Assessment/Plan: Problem #1 renal failure chronic presently has seems to be reaching end-stage. Patient doesn't have any nausea vomiting. However her appetite is poor. Her BUN and creatinine has increased significantly and presently also getting worse. Patient is none oliguric. Problem #2 hyperkalemia possibly combination of ACE inhibitor and worsening of renal failure potassium today is normal Problem #history of hypertension blood pressure seems to be controlled very well Problem #4 arthritis Problem #5 history of schizoaffective disorder. Problem #6 history of COPD presently she is on inhaler she seems to be feeling better Problem #7 history of anemia most likely secondary to chronic renal failure. And patient has received blood transfusion before. Her hemoglobin and hematocrit seems to be low. Problem #8 history of  charity Problem #9 history of CHF presently she doesn't have any significant sign of fluid overload. Plan we'll check hepatitis B surface antigen, hepatitis B surface antibody, and hepatitis B core antibody  We'll put  PPD We'll start her on Epogen 10,000 units subcutaneous once a week We'll check her CBC, basic metabolic panel, phosphorus and intact PTH If her renal function continued to get worse we'll consider starting her on hemodialysis after access placement.  [name] S 01/17/2011, 8:11 AM

## 2011-01-17 NOTE — Progress Notes (Addendum)
CRITICAL VALUE ALERT  Critical value received:  Calcium 5.7  Date of notification: 01/17/11   Time of notification:  0720  Critical value read back: yes  Nurse who received alert:  Floreen Comber, RN  MD notified (1st page):  [name]  Time of first page: 0725  MD notified (2nd page):  Time of second page:  Responding MD:  [name]  Time MD responded: 0730

## 2011-01-18 LAB — BASIC METABOLIC PANEL
Calcium: 5.6 mg/dL — CL (ref 8.4–10.5)
GFR calc Af Amer: 7 mL/min — ABNORMAL LOW (ref >90)
GFR calc non Af Amer: 6 mL/min — ABNORMAL LOW (ref >90)
Potassium: 4.4 mEq/L (ref 3.5–5.1)
Sodium: 126 mEq/L — ABNORMAL LOW (ref 135–145)

## 2011-01-18 LAB — PTH, INTACT AND CALCIUM
Calcium, Total (PTH): 5.5 mg/dL — CL (ref 8.4–10.5)
PTH: 1500 pg/mL — ABNORMAL HIGH (ref 14.0–72.0)

## 2011-01-18 LAB — IRON AND TIBC
Iron: 52 ug/dL (ref 42–135)
TIBC: 207 ug/dL — ABNORMAL LOW (ref 250–470)

## 2011-01-18 LAB — FERRITIN: Ferritin: 471 ng/mL — ABNORMAL HIGH (ref 10–291)

## 2011-01-18 LAB — CBC
Hemoglobin: 8.9 g/dL — ABNORMAL LOW (ref 12.0–15.0)
MCHC: 32.7 g/dL (ref 30.0–36.0)
Platelets: 215 10*3/uL (ref 150–400)
RBC: 3.22 MIL/uL — ABNORMAL LOW (ref 3.87–5.11)

## 2011-01-18 MED ORDER — TORSEMIDE 20 MG PO TABS
50.0000 mg | ORAL_TABLET | Freq: Two times a day (BID) | ORAL | Status: DC
  Administered 2011-01-18 – 2011-01-19 (×3): 50 mg via ORAL
  Filled 2011-01-18 (×3): qty 3

## 2011-01-18 MED ORDER — PREDNISONE 20 MG PO TABS
50.0000 mg | ORAL_TABLET | Freq: Every day | ORAL | Status: AC
  Administered 2011-01-18 – 2011-01-21 (×4): 50 mg via ORAL
  Filled 2011-01-18 (×4): qty 1

## 2011-01-18 MED ORDER — SODIUM BICARBONATE 650 MG PO TABS
650.0000 mg | ORAL_TABLET | Freq: Two times a day (BID) | ORAL | Status: DC
  Administered 2011-01-18 – 2011-01-19 (×3): 650 mg via ORAL
  Filled 2011-01-18 (×4): qty 1

## 2011-01-18 NOTE — Progress Notes (Signed)
NAMEJOHNNAY, [name]               ACCOUNT NO.:  [id number]  MEDICAL RECORD NO.:  [id number]  LOCATION:  A301                          FACILITY:  [facility]  PHYSICIAN:  Kingsley Callander. [name], MD       DATE OF BIRTH:  11/29/1940  DATE OF PROCEDURE:  01/18/2011 DATE OF DISCHARGE:                                PROGRESS NOTE   Ms. Shreeve states that she wants to go home.  She is breathing better. She has been seen in Nephrology consultation by Dr. [name].  She has been started on fluids and IV Lasix.  VITAL SIGNS:  Temperature 98.2, pulse 89, respirations 22, blood pressure 160/80, oxygen saturation 93%. LUNGS:  Diminished breath sounds.  No wheezes. HEART:  Regular with no murmurs or rubs. ABDOMEN:  Soft and nontender. NEUROLOGIC:  Stable.  IMPRESSION/PLAN: 1. Chronic obstructive pulmonary disease exacerbation, improved.     Continue nebulizer treatments. 2. Chronic kidney disease.  BUN and creatinine today are 76 and 6.50.     We will continue fluids and Lasix per Nephrology.  Discussion has     been held regarding dialysis access placement.  She tells me this     morning that her husband will not let her have this would discuss     this further with her daughters and Nephrology. 3. Hyperkalemia.  Serum potassium is again normal at 4.4. 4. Anemia.  Stable with a hemoglobin of 8.9. 5. Hypertension.  Continue amlodipine, labetalol, and Lasix.  She has     net positive I and O of 1939.  She has had a urine output of 700     mL.     Kingsley Callander. [name], MD     ROF/MEDQ  D:  01/18/2011  T:  01/18/2011  Job:  [id number]

## 2011-01-18 NOTE — Progress Notes (Signed)
Subjective: Interval History: has no complaint of nausea or vomiting. Patient says that she's feeling better. She denies any difficulty increasing. She still have occasional cough..  Objective: Vital signs in last 24 hours: Temp:  [98.2 F (36.8 C)-98.5 F (36.9 C)] 98.2 F (36.8 C) (01/03 0600) Pulse Rate:  [76-89] 89  (01/03 0600) Resp:  [20-22] 22  (01/03 0600) BP: (135-160)/(58-91) 160/80 mmHg (01/03 0600) SpO2:  [91 %-97 %] 95 % (01/03 0743) Weight:  [99.8 kg (220 lb 0.3 oz)] 220 lb 0.3 oz (99.8 kg) (01/03 0500) Weight change: 3.774 kg (8 lb 5.1 oz)  Intake/Output from previous day: 01/02 0701 - 01/03 0700 In: 2639.2 [P.O.:960; I.V.:1555.2; IV Piggyback:124] Out: 700 [Urine:700] Intake/Output this shift:    General appearance: no distress Resp: diminished breath sounds bilaterally and rhonchi bilaterally and posterior - bilateral Cardio: regular rate and rhythm, S1, S2 normal, no murmur, click, rub or gallop Extremities she has trace edema. Lab Results:  [facility] 01/18/11 0509 01/17/11 0604  WBC 9.7 6.7  HGB 8.9* 8.7*  HCT 27.2* 27.1*  PLT 215 200   BMET:  Basename 01/18/11 0509 01/17/11 0856  NA 126* 127*  K 4.4 4.5  CL 93* 96  CO2 15* 16*  GLUCOSE 120* 224*  BUN 76* 73*  CREATININE 6.50* 6.96*  CALCIUM 5.6* 5.6*   No results found for this basename: PTH:2 in the last 72 hours Iron Studies: No results found for this basename: IRON,TIBC,TRANSFERRIN,FERRITIN in the last 72 hours  Studies/Results: US Abdomen Complete  01/16/2011  *RADIOLOGY REPORT*  Clinical Data:  Question of ascites.  History of hypertension, elevated cholesterol.  Obesity.  COMPLETE ABDOMINAL ULTRASOUND  Comparison:  Renal ultrasound 04/03/2007, CT of the abdomen and pelvis 05/22/2005  Findings:  Gallbladder:  The gallbladder wall is upper limits normal, 2.8 mm. No sonographic Murphy's sign.  No stones.  No pericholecystic fluid.  Common bile duct:  Common bile duct is normal in caliber, 3.8  mm.  Liver:  The liver is mildly echogenic without focal lesion.  Normal sized.  IVC:  Appears normal.  Pancreas:  The pancreas is not well seen because of overlying bowel gas.  Spleen:  The spleen has a normal appearance and measures 7.7 cm.  Right Kidney:  The right kidney is echogenic and is 9.8 cm in length.  A cyst in the midpole region is 1.9 x 2.0 x 2.1 cm.  Left Kidney:  The left kidney is 9.5 cm in length and is mildly echogenic.  Multiple cysts are identified.  The largest is 6.8 x 6.7 x 6.4 cm.  Abdominal aorta:  The abdominal aorta is mildly aneurysmal, 3.6 cm. Intimal plaque is identified.  On prior CT exam, maximum infrarenal diameter was 2.7 x 2.9 cm.  Additional findings:  No ascites.  IMPRESSION:  1.  No evidence for acute cholecystitis. 2.  Bilateral renal cysts. 3.  Infrarenal abdominal aortic aneurysm, 3.6 cm.  This has increased slightly since prior study in 2007.  Original Report Authenticated By: Patterson Hammersmith, M.D.    I have reviewed the patient's current medications.  Assessment/Plan: Problem #1 renal failure possibly her chronic her pending creatinine slightly better his last 76 and 6.5. Patient doesn't have any uremic sinus symptoms. Problem #2 hyponatremia possibly secondary to hypervolumic hyponatremia with hypotonic fluid intake   Problem #3 low CO2 possibly metabolic Problem #4 history of CHF patient is nonoliguric clinical assessment improving Problem #5 history of difficulty breathing possibly combination of COPD and CHF  presently she is feeling better her however still she has some expiratory wheezing. Problem #6 history of schizoaffective disorder Problem #7 history of hypertension blood pressure seems to be controlled reasonably good Problem #8 history of anemia possibly secondary to chronic renal failure patient is started on Epogen. Plan: I have discussed with the patient and also  her daughters one of them from [place] nurse practitioner and I explained  patient's general condition and the need for possible dialysis. At this moment patient want to wait and family's also wants to wait a little bit and inform her about the need for hemodialysis. Since at this moment there is no urgent reason to dialyze her we have agreed to see her in his office in one a week and make a decision about dialysis specially if her renal function doesn't improve  Or if patient started having symptoms off uremia or CHF. We'll DC IV fluid to and Lasix We'll put her on Demadex 80 mg once a day Patient advised to cut down her salt and fluid intake. Her see her in a week and make a decision about access placement and possible dialysis    LOS: 2 days   Helga Asbury S 01/18/2011,9:06 AM

## 2011-01-18 NOTE — Plan of Care (Signed)
Problem: Food- and Nutrition-Related Knowledge Deficit (NB-1.1) Goal: Nutrition education Formal process to instruct or train a patient/client in a skill or to impart knowledge to help patients/clients voluntarily manage or modify food choices and eating behavior to maintain or improve health. Outcome: Adequate for Discharge Pt receiving Renal Diet 80-90-2-2-02/1198 ml. Pt and daughter present. Daughter primary learner. She shops and prepares pt food and had several questions r/t guidelines. Pt already follows Low Sodium diet at home and daughter demonstrated understanding of material. Expect good adherence. Handouts included Renal Diet Guidelines for persons not on dialysis, Sodium, Potassium, Phosphorus Content of common foods.

## 2011-01-18 NOTE — Progress Notes (Signed)
CARE MANAGEMENT NOTE 01/18/2011  Patient:  CHANETTA, MOOSMAN   Account Number:  [id number]  Date Initiated:  01/18/2011  Documentation initiated by:  Rosemary Holms  Subjective/Objective Assessment:   Pt admitted with COPD. PTA, lived at home with family.     Action/Plan:   Spoke to daughter and pt this morning. Pt was scheduled for Outpt PT next week. Awaiting progress report from PT to decide on [facility] or Outpt PT.   Anticipated DC Date:  01/19/2011   Anticipated DC Plan:  HOME/SELF CARE      DC Planning Services  CM consult      Choice offered to / List presented to:  C-4 Adult Children           Status of service:  In process, will continue to follow Medicare Important Message given?   (If response is "NO", the following Medicare IM given date fields will be blank) Date Medicare IM given:   Date Additional Medicare IM given:    Discharge Disposition:    Per UR Regulation:    Comments:  01/18/11 900 Zayanna Pundt Leanord Hawking RN BSN CM

## 2011-01-19 ENCOUNTER — Encounter (HOSPITAL_COMMUNITY): Payer: No Typology Code available for payment source

## 2011-01-19 LAB — BASIC METABOLIC PANEL
BUN: 80 mg/dL — ABNORMAL HIGH (ref 6–23)
Chloride: 94 mEq/L — ABNORMAL LOW (ref 96–112)
Creatinine, Ser: 6.51 mg/dL — ABNORMAL HIGH (ref 0.50–1.10)
GFR calc Af Amer: 7 mL/min — ABNORMAL LOW (ref >90)
Glucose, Bld: 104 mg/dL — ABNORMAL HIGH (ref 70–99)

## 2011-01-19 LAB — LIPID PANEL
HDL: 37 mg/dL — ABNORMAL LOW (ref >39)
LDL Cholesterol: 40 mg/dL (ref 0–99)
Total CHOL/HDL Ratio: 2.6 RATIO
VLDL: 20 mg/dL (ref 0–40)

## 2011-01-19 MED ORDER — TORSEMIDE 20 MG PO TABS
100.0000 mg | ORAL_TABLET | Freq: Once | ORAL | Status: AC
  Administered 2011-01-19: 100 mg via ORAL
  Filled 2011-01-19: qty 5

## 2011-01-19 MED ORDER — SODIUM CHLORIDE 0.9 % IV SOLN
2.0000 g | Freq: Once | INTRAVENOUS | Status: AC
  Administered 2011-01-19: 2 g via INTRAVENOUS
  Filled 2011-01-19 (×2): qty 20

## 2011-01-19 MED ORDER — SODIUM CHLORIDE 0.9 % IJ SOLN
INTRAMUSCULAR | Status: AC
  Administered 2011-01-19: 10 mL
  Filled 2011-01-19: qty 6

## 2011-01-19 MED ORDER — CALCIUM CARBONATE ANTACID 500 MG PO CHEW
800.0000 mg | CHEWABLE_TABLET | Freq: Three times a day (TID) | ORAL | Status: DC
  Administered 2011-01-19 – 2011-01-24 (×11): 800 mg via ORAL
  Filled 2011-01-19 (×3): qty 4
  Filled 2011-01-19 (×3): qty 2
  Filled 2011-01-19 (×4): qty 4
  Filled 2011-01-19: qty 1
  Filled 2011-01-19 (×2): qty 2
  Filled 2011-01-19 (×2): qty 4

## 2011-01-19 MED ORDER — SODIUM BICARBONATE 650 MG PO TABS
650.0000 mg | ORAL_TABLET | Freq: Two times a day (BID) | ORAL | Status: DC
  Administered 2011-01-19 – 2011-01-23 (×8): 650 mg via ORAL
  Filled 2011-01-19 (×8): qty 1

## 2011-01-19 MED ORDER — SODIUM CHLORIDE 0.9 % IJ SOLN
INTRAMUSCULAR | Status: AC
  Administered 2011-01-19: 10 mL
  Filled 2011-01-19: qty 3

## 2011-01-19 MED ORDER — CALCIUM CARBONATE ANTACID 500 MG PO CHEW
2.0000 | CHEWABLE_TABLET | Freq: Three times a day (TID) | ORAL | Status: DC
  Administered 2011-01-19 (×2): 400 mg via ORAL
  Filled 2011-01-19 (×2): qty 1

## 2011-01-19 MED ORDER — CALCITRIOL 0.25 MCG PO CAPS
0.5000 ug | ORAL_CAPSULE | Freq: Every day | ORAL | Status: DC
  Administered 2011-01-19 – 2011-01-24 (×5): 0.5 ug via ORAL
  Filled 2011-01-19: qty 2
  Filled 2011-01-19 (×3): qty 1
  Filled 2011-01-19: qty 2
  Filled 2011-01-19 (×3): qty 1
  Filled 2011-01-19: qty 2

## 2011-01-19 NOTE — Progress Notes (Signed)
UR Chart Review Completed  

## 2011-01-19 NOTE — Progress Notes (Signed)
Subjective: Interval History: has no complaint of nausea or vomiting. Patient is a that she is feeling better. She has occasional cough but nonproductive..  Objective: Vital signs in last 24 hours: Temp:  [97.6 F (36.4 C)-99.1 F (37.3 C)] 97.6 F (36.4 C) (01/04 1230) Pulse Rate:  [72-88] 86  (01/04 1230) Resp:  [18-20] 18  (01/04 1230) BP: (113-154)/(63-95) 113/63 mmHg (01/04 1230) SpO2:  [88 %-98 %] 93 % (01/04 1230) Weight:  [99.3 kg (218 lb 14.7 oz)] 218 lb 14.7 oz (99.3 kg) (01/04 0506) Weight change: -0.5 kg (-1 lb 1.6 oz)  Intake/Output from previous day: 01/03 0701 - 01/04 0700 In: 560 [P.O.:560] Out: -  Intake/Output this shift:    General appearance: alert and cooperative Resp: diminished breath sounds bilaterally and rhonchi posterior - bilateral Cardio: regular rate and rhythm, S1, S2 normal, no murmur, click, rub or gallop GI: soft, non-tender; bowel sounds normal; no masses,  no organomegaly Extremities: edema trace  Lab Results:  [facility] 01/18/11 0509 01/17/11 0604  WBC 9.7 6.7  HGB 8.9* 8.7*  HCT 27.2* 27.1*  PLT 215 200   BMET:  Basename 01/19/11 0444 01/18/11 0509  NA 121* 126*  K 4.4 4.4  CL 94* 93*  CO2 16* 15*  GLUCOSE 104* 120*  BUN 80* 76*  CREATININE 6.51* 6.50*  CALCIUM 5.3* 5.6*    Basename 01/17/11 0856  PTH 1500.0*   Iron Studies:  Basename 01/18/11 0509  IRON 52  TIBC 207*  TRANSFERRIN --  FERRITIN 471*    Studies/Results: No results found.  I have reviewed the patient's current medications.  Assessment/Plan: Problem #1 renal failure at this moment possibly chronic her BUN and creatinine is 80 and 6.51 stable but not improving. Presently she is none oliguric. And patient denies any nausea or vomiting. Problem #2 hyponatremia as this seems to be secondary to hypovolemic hyponatremia related to intravascular volume depletion with hypotonic fluid replacement. Sodium is 121 Problem #3 history of anemia her this is  secondary to chronic renal failure her hemoglobin is 8.9 hematocrit is 27.2 patient is started on erythropoietin. Problem #4 history of hypocalcemia this is also related to her chronic renal failure calcium seems to be declining however her patient still is symptomatic. Calcium today is 5.3 corrected for albumin still seems to be low. Problem #5 history of secondary hyperparathyroidism PTH is very high Problem #6 history of hyperphosphatemia Problem #7 history of COPD Problem #8 history of schizoaffective disorder  Plan: I will give her calcium gluconate have 2 amps in 250 normal saline at 50 cc per hour           We'll start her on calcium carbonate 1.5 g by mouth 3 times a day          We'll restrict free water intake to 500 cc per 24 hours         We'll start her on Rocaltrol 1 mcg by mouth once a day          Have discussed with the patient and also her daughter today again about dialysis at this moment is still she want to wait and make a decision when she comes to the office. We'll check her be BMET in the morning.   LOS: 3 days   Towanda Hornstein S 01/19/2011,1:00 PM

## 2011-01-19 NOTE — Progress Notes (Signed)
[name], [name]               ACCOUNT NO.:  [id number]  MEDICAL RECORD NO.:  [id number]  LOCATION:  A301                          FACILITY:  [facility]  PHYSICIAN:  Kingsley Callander. [name], MD       DATE OF BIRTH:  06/23/1940  DATE OF PROCEDURE: DATE OF DISCHARGE:                                PROGRESS NOTE   [name] feels comfortable this morning.  Lasix and IV fluids have been stopped by Nephrology.  Dialysis was discussed with the family, but the decision has been to watch and wait for now with further discussion with Nephrology as an outpatient after discharge.  PHYSICAL EXAMINATION:  VITAL SIGNS:  She is breathing comfortably.  She was started on oxygen after she had an oxygen saturation last night of 88, her oxygen saturation now is 97%.  Temperature is 98.4, pulse 77, respirations 20, and blood pressure 154/95. LUNGS:  Revealed mild expiratory wheezes. HEART:  Regular with no murmurs, gallops, or rubs. ABDOMEN:  Soft and nontender. EXTREMITIES:  Revealed no edema.  IMPRESSION AND PLAN: 1. Chronic obstructive pulmonary disease.  Continue nebulizer     treatments and prednisone.  Continue supplemental oxygen for now. 2. Chronic kidney disease, BUN and creatinine are 80 and 6.51. 3. Hyponatremia.  Her serum sodium level has dropped from 126 to 121.     We will restrict free water. 4. Hypocalcemia.  Serum calcium is 5.3.  She is receiving oral calcium     replacements.  We will discuss further with Nephrology. 5. Anemia.  She has received Procrit and her hemoglobin yesterday was     8.9. 6. Hypertension.  Continue current therapy.  She has had no sign of     recurrent hyperkalemia.  Serum potassium level today is 4.4. 7. Hyperlipidemia.  Cholesterol is 97 with an HDL of 37 and LDL of 40     with a triglyceride level of 100.     Kingsley Callander. [name], MD     ROF/MEDQ  D:  01/19/2011  T:  01/19/2011  Job:  [id number]

## 2011-01-19 NOTE — Progress Notes (Signed)
CRITICAL VALUE ALERT  Critical value received:  Calcium 5.3  Date of notification:  01/19/11  Time of notification:  0625  Critical value read back:yes  Nurse who received alert:  P.J. Henderson Newcomer, RN  MD notified (1st page):  Dr. [name]  Time of first page:  0630  MD notified (2nd page): Dr. [name]   Time of second page:0650  Responding MD:  Dr. [name]  Time MD responded:  [phone]

## 2011-01-19 NOTE — Progress Notes (Signed)
CARE MANAGEMENT NOTE 01/19/2011  Patient:  [name], [name]   Account Number:  [id number]  Date Initiated:  01/18/2011  Documentation initiated by:  Rosemary Holms  Subjective/Objective Assessment:   Pt admitted with COPD. PTA, lived at home with family.     Action/Plan:   Spoke to daughter and pt this morning. Pt was scheduled for Outpt PT next week. Awaiting progress report from PT to decide on [facility] PT or Outpt PT.   Anticipated DC Date:  01/19/2011   Anticipated DC Plan:  HOME/SELF CARE      DC Planning Services  CM consult      Choice offered to / List presented to:  C-4 Adult Children           Status of service:  In process, will continue to follow Medicare Important Message given?   (If response is "NO", the following Medicare IM given date fields will be blank) Date Medicare IM given:   Date Additional Medicare IM given:    Discharge Disposition:    Per UR Regulation:    Comments:  01/19/11 1530 Eileen Croswell Leanord Hawking RN BSN CM Spoke w/ pt who was unable to tell CM which agency for [facility] they would prefer.  01/18/11 900 Celester Lech Leanord Hawking RN BSN CM

## 2011-01-20 LAB — BASIC METABOLIC PANEL
Calcium: 5.5 mg/dL — CL (ref 8.4–10.5)
GFR calc Af Amer: 7 mL/min — ABNORMAL LOW (ref >90)
GFR calc non Af Amer: 6 mL/min — ABNORMAL LOW (ref >90)
Glucose, Bld: 111 mg/dL — ABNORMAL HIGH (ref 70–99)
Potassium: 4.5 mEq/L (ref 3.5–5.1)
Sodium: 119 mEq/L — CL (ref 135–145)

## 2011-01-20 LAB — HEPATITIS B SURFACE ANTIBODY,QUALITATIVE: Hep B S Ab: NEGATIVE

## 2011-01-20 MED ORDER — ATORVASTATIN CALCIUM 10 MG PO TABS
20.0000 mg | ORAL_TABLET | Freq: Every day | ORAL | Status: DC
  Administered 2011-01-21 – 2011-01-22 (×2): 20 mg via ORAL
  Filled 2011-01-20 (×4): qty 2

## 2011-01-20 MED ORDER — TUBERCULIN PPD 5 UNIT/0.1ML ID SOLN
5.0000 [IU] | Freq: Once | INTRADERMAL | Status: AC
  Administered 2011-01-20: 5 [IU] via INTRADERMAL
  Filled 2011-01-20: qty 0.1

## 2011-01-20 MED ORDER — AMLODIPINE BESYLATE 5 MG PO TABS
10.0000 mg | ORAL_TABLET | Freq: Every day | ORAL | Status: DC
  Administered 2011-01-21 – 2011-01-24 (×3): 10 mg via ORAL
  Filled 2011-01-20 (×3): qty 2

## 2011-01-20 MED ORDER — FUROSEMIDE 10 MG/ML IJ SOLN
200.0000 mg | Freq: Two times a day (BID) | INTRAVENOUS | Status: DC
  Administered 2011-01-20 – 2011-01-24 (×8): 200 mg via INTRAVENOUS
  Filled 2011-01-20 (×11): qty 20

## 2011-01-20 MED ORDER — SODIUM CHLORIDE 0.9 % IV SOLN
INTRAVENOUS | Status: DC
  Administered 2011-01-20 – 2011-01-24 (×5): via INTRAVENOUS

## 2011-01-20 MED ORDER — SODIUM CHLORIDE 1 G PO TABS
1.0000 g | ORAL_TABLET | Freq: Three times a day (TID) | ORAL | Status: DC
  Administered 2011-01-20 – 2011-01-21 (×4): 1 g via ORAL
  Filled 2011-01-20 (×7): qty 1

## 2011-01-20 MED ORDER — SODIUM CHLORIDE 0.9 % IJ SOLN
INTRAMUSCULAR | Status: AC
  Administered 2011-01-20: 10 mL
  Filled 2011-01-20: qty 3

## 2011-01-20 NOTE — Progress Notes (Signed)
CRITICAL VALUE ALERT  Critical value received:  Sodium 119 and Calcium 5.5  Date of notification:  01/20/11  Time of notification:  0527  Critical value read back:yes  Nurse who received alert:  P.J. Henderson Newcomer, RN  MD notified (1st page):  Dr. Juanetta Gosling  Time of first page:  0535  MD notified (2nd page):  Time of second page:  Responding MD:  Dr. Juanetta Gosling  Time MD responded:  [phone]

## 2011-01-20 NOTE — Progress Notes (Signed)
01/20/11 1754 Patient assisted up to chair this afternoon with nurse assist, general weakness, chair alarm on for safety. Tolerated increase in activity fairly well, family at bedside at this time.

## 2011-01-20 NOTE — Progress Notes (Signed)
01/20/11 1753 Patient has consult to interventional radiology for hemodialysis cath placement, Dr [name] stated this am would like done tomorrow if possible, IR stated would be able to place cath on Monday. Notified Dr [name] of this, stated ok and would see patient tomorrow.

## 2011-01-20 NOTE — Progress Notes (Signed)
Subjective: Interval History: has no complaint of nausea or vomiting. Patient also denies any difficulty in breathing.  Objective: Vital signs in last 24 hours: Temp:  [97.6 F (36.4 C)-98.3 F (36.8 C)] 98.1 F (36.7 C) (01/05 0509) Pulse Rate:  [74-86] 80  (01/05 0509) Resp:  [16-20] 20  (01/05 0509) BP: (113-150)/(63-82) 150/82 mmHg (01/05 0509) SpO2:  [93 %-100 %] 96 % (01/05 0713) Weight:  [100.472 kg (221 lb 8 oz)] 221 lb 8 oz (100.472 kg) (01/05 0509) Weight change: 1.172 kg (2 lb 9.3 oz)  Intake/Output from previous day: 01/04 0701 - 01/05 0700 In: 1080 [P.O.:960; IV Piggyback:120] Out: 550 [Urine:550] Intake/Output this shift:    General appearance: alert, cooperative and no distress Resp: diminished breath sounds bilaterally GI: soft, non-tender; bowel sounds normal; no masses,  no organomegaly Extremities: edema Trace 1+ edema  Lab Results:  [facility] 01/18/11 0509  WBC 9.7  HGB 8.9*  HCT 27.2*  PLT 215   BMET:  Basename 01/20/11 0415 01/19/11 0444  NA 119* 121*  K 4.5 4.4  CL 91* 94*  CO2 16* 16*  GLUCOSE 111* 104*  BUN 82* 80*  CREATININE 6.49* 6.51*  CALCIUM 5.5* 5.3*    Basename 01/17/11 0856  PTH 1500.0*   Iron Studies:  Basename 01/18/11 0509  IRON 52  TIBC 207*  TRANSFERRIN --  FERRITIN 471*    Studies/Results: No results found.  I have reviewed the patient's current medications.  Assessment/Plan: Problem #1 renal failure possibly acute on chronic BUN and creatinine is 82 and 6.49 remains high. Patient with underlying chronic renal failure her baseline creatinine about a year ago was about 4. Has this moment she doesn't have any uremic sign and symptoms. However her patient is still oliguric Problem #2 hyponatremia sodium is still declining. This is thought to be secondary to hypovolemic hyponatremia. Patient is still drinking water and using also some ice. However her presently she is a symptomatic. Problem #3 metabolic acidosis her  CO2 is 16 Problem #4 history of anemia her hemoglobin is 8.9 hematocrit 27.2 low but stable Problem #5 history of hypocalcemia calcium is 5.5 presently she is on calcium carbonate calcium slightly better. Her phosphorus and PTH is also high secondary to chronic renal failure. Problem #6 history of COPD Problem #7 history of GERD Problem #8 history of hypertension her blood pressure seems to be controlled very well. Recommendation: Patient has agreed for hemodialysis hence we'll send her to radiology  [facility] dialysis catheter placement. We'll DC free water We'll start her on sodium tablets 1 g by mouth 3 times a day We'll start her on normal saline at 50 cc per hour. We'll continue his other medications as before. We'll check CBC, basic metabolic panel and phosphorus tomorrow.    LOS: 4 days   Zayanna Pundt S 01/20/2011,8:46 AM

## 2011-01-20 NOTE — Progress Notes (Signed)
[name], [name]               ACCOUNT NO.:  [id number]  MEDICAL RECORD NO.:  [id number]  LOCATION:  A301                          FACILITY:  [facility]  PHYSICIAN:  Kingsley Callander. [name], MD       DATE OF BIRTH:  1940/04/11  DATE OF PROCEDURE: DATE OF DISCHARGE:                                PROGRESS NOTE   Ms. Sachdeva is breathing comfortably this morning.  She has had further discussion regarding dialysis with Dr. [name].  She now consents to placement of a dialysis catheter by Interventional Radiology.  She has not yet had a shunt placed.  VITAL SIGNS:  Temperature 98.1, pulse 80, respirations 20, blood pressure 150/82, and oxygen saturation 96% on 2 L. LUNGS:  Clear.  No wheezes. HEART:  Regular with no murmurs.  No rubs or gallops. ABDOMEN:  Soft and nontender. EXTREMITIES:  No edema. NEURO:  At baseline.  IMPRESSION/PLAN: 1. Chronic kidney failure.  BUN and creatinine are 82 and 6.49.  She     will need dialysis in the near future.  As stated, she is     consenting to dialysis catheter placement at this point. 2. Hyponatremia.  Serum sodium is dropped further to 119.  She is     being started on normal saline and Lasix by Dr. [name]. 3. Hypocalcemia.  Calcium today is 5.5.  She received 2 amps of     calcium yesterday and has had her dosage increase in calcium     carbonate orally. 4. Hyperkalemia.  No recurrence.  Potassium is 4.5. 5. Hyperlipidemia.  Based on her low cholesterol level, her     atorvastatin dose can be reduced to 20 mg. 6. Hypertension.  Continue amlodipine, labetalol, and Lasix.     Kingsley Callander. [name], MD     ROF/MEDQ  D:  01/20/2011  T:  01/20/2011  Job:  [id number]

## 2011-01-20 NOTE — Progress Notes (Signed)
01/20/11 1340 Tuberculin skin test administered to left anterior forearm as ordered. See MAR for documentation. Patient tolerated well with no complaints. Site marked for reassessment. Nursing to monitor.

## 2011-01-21 LAB — URINALYSIS, ROUTINE W REFLEX MICROSCOPIC
Glucose, UA: NEGATIVE mg/dL
Ketones, ur: NEGATIVE mg/dL
Leukocytes, UA: NEGATIVE
Nitrite: NEGATIVE
Specific Gravity, Urine: 1.01 (ref 1.005–1.030)
pH: 5.5 (ref 5.0–8.0)

## 2011-01-21 LAB — BASIC METABOLIC PANEL
CO2: 18 mEq/L — ABNORMAL LOW (ref 19–32)
Chloride: 92 mEq/L — ABNORMAL LOW (ref 96–112)
Creatinine, Ser: 6.29 mg/dL — ABNORMAL HIGH (ref 0.50–1.10)
GFR calc Af Amer: 7 mL/min — ABNORMAL LOW (ref >90)
Potassium: 4.3 mEq/L (ref 3.5–5.1)

## 2011-01-21 LAB — URINE MICROSCOPIC-ADD ON

## 2011-01-21 LAB — OSMOLALITY, URINE: Osmolality, Ur: 218 mOsm/kg — ABNORMAL LOW (ref 390–1090)

## 2011-01-21 MED ORDER — BIOTENE DRY MOUTH MT LIQD
15.0000 mL | Freq: Two times a day (BID) | OROMUCOSAL | Status: DC
  Administered 2011-01-22 – 2011-01-24 (×5): 15 mL via OROMUCOSAL

## 2011-01-21 MED ORDER — ALPRAZOLAM 0.5 MG PO TABS
0.5000 mg | ORAL_TABLET | Freq: Two times a day (BID) | ORAL | Status: DC
  Administered 2011-01-21 – 2011-01-23 (×4): 0.5 mg via ORAL
  Filled 2011-01-21 (×4): qty 1

## 2011-01-21 MED ORDER — POLYETHYLENE GLYCOL 3350 17 G PO PACK
17.0000 g | PACK | Freq: Every day | ORAL | Status: DC
  Administered 2011-01-21: 17 g via ORAL
  Filled 2011-01-21: qty 1

## 2011-01-21 MED ORDER — PREDNISONE 20 MG PO TABS
20.0000 mg | ORAL_TABLET | Freq: Every day | ORAL | Status: DC
  Administered 2011-01-22 – 2011-01-24 (×2): 20 mg via ORAL
  Filled 2011-01-21 (×2): qty 1

## 2011-01-21 NOTE — Progress Notes (Signed)
Subjective: Interval History: has no complaint of nausea or vomiting. Her appetite is good. Patient at this moment denies any  cough..  Objective: Vital signs in last 24 hours: Temp:  [98 F (36.7 C)-99.4 F (37.4 C)] 99.4 F (37.4 C) (01/06 0513) Pulse Rate:  [79-88] 79  (01/06 0513) Resp:  [16-18] 16  (01/06 0513) BP: (136-150)/(56-83) 136/75 mmHg (01/06 0513) SpO2:  [93 %-99 %] 99 % (01/06 0649) Weight:  [102.785 kg (226 lb 9.6 oz)] 226 lb 9.6 oz (102.785 kg) (01/06 0445) Weight change: 2.313 kg (5 lb 1.6 oz)  Intake/Output from previous day: 01/05 0701 - 01/06 0700 In: 1622 [P.O.:930; I.V.:642; IV Piggyback:50] Out: 2000 [Urine:2000] Intake/Output this shift:    General appearance: alert, cooperative and no distress Resp: clear to auscultation bilaterally Cardio: regular rate and rhythm, S1, S2 normal, no murmur, click, rub or gallop GI: soft, non-tender; bowel sounds normal; no masses,  no organomegaly Extremities: extremities normal, atraumatic, no cyanosis or edema  Lab Results: No results found for this basename: WBC:2,HGB:2,HCT:2,PLT:2 in the last 72 hours BMET:  [facility] 01/21/11 0611 01/20/11 0415  NA 123* 119*  K 4.3 4.5  CL 92* 91*  CO2 18* 16*  GLUCOSE 115* 111*  BUN 89* 82*  CREATININE 6.29* 6.49*  CALCIUM 6.1* 5.5*   No results found for this basename: PTH:2 in the last 72 hours Iron Studies: No results found for this basename: IRON,TIBC,TRANSFERRIN,FERRITIN in the last 72 hours  Studies/Results: No results found.  I have reviewed the patient's current medications.  Assessment/Plan: Problem #1 renal failure her chronic her BUN is 89 creatinine 6.29 at this moment is stable. Patient doesn't have any uremic sign and symptoms. Patient is going to have a catheter placement tomorrow for dialysis. Problem #2 hyponatremia,: Sodium 123 improving. Problem #3 hypocalcemia patient on calcium carbonate calcium 6.1 progressively improving. Problem #4 metabolic  acidosis patient is on sodium bicarbonate her CO2 is 18 that is the increasing. Problem #5 history of  cardiomyopathy with CHF patient presently on diuretics she is about 2 L of urine and she doesn't have any significant sign and symptom of her CHF Problem #6 history of hypertension blood pressure seems to control very well Problem #7 history of COPD Problem #8 history of GERD Problem #9 anemia is secondary to chronic renal failure. Plan: We'll continue his present management           We'll check CBC, basic metabolic panel, phosphorus in the morning           We'll consider initiating hemodialysis after catheter is placed.    LOS: 5 days   Delmon Andrada S 01/21/2011,8:43 AM

## 2011-01-21 NOTE — Progress Notes (Signed)
[name], [name]               ACCOUNT NO.:  [id number]  MEDICAL RECORD NO.:  [id number]  LOCATION:  A301                          FACILITY:  [facility]  PHYSICIAN:  Kingsley Callander. [name], MD       DATE OF BIRTH:  09/28/1940  DATE OF PROCEDURE:  01/21/2011 DATE OF DISCHARGE:                                PROGRESS NOTE   [name] is alert and comfortable this morning.  She is eating well. She complains of some constipation.  Temperature is 99.4 with a pulse of 79, respirations 16, and blood pressure of 136/75, oxygen saturation is 99%.  Lungs revealed diminished but clear breath sounds.  Heart, regular with no murmurs or rubs.  Abdomen is soft and nontender.  Neuro is stable.  IMPRESSION/PLAN: 1. Hyponatremia improved to 123 with a free water restriction. 2. Chronic renal failure.  BUN and creatinine are 89 and 6.29 with a     serum potassium of 4.3, and a bicarb of 18.  She continues to     receive saline and Lasix.  She has had urine output yesterday of     2000 mL. 3. Hypertension controlled at 136/75. 4. Hypocalcemia improved to 6.1. 5. Continue calcium carbonate supplementation. 6. Constipation.  We will treat with MiraLax. 7. Chronic obstructive pulmonary disease exacerbation.  Decrease     prednisone to 20 mg daily.  Continue nebs.  Discontinue oxygen.     Kingsley Callander. [name], MD     ROF/MEDQ  D:  01/21/2011  T:  01/21/2011  Job:  [id number]

## 2011-01-22 ENCOUNTER — Encounter (HOSPITAL_COMMUNITY)
Admission: EM | Disposition: A | Discharge status: Home Health | Payer: Self-pay | Source: Home / Self Care | Attending: Internal Medicine

## 2011-01-22 ENCOUNTER — Ambulatory Visit (HOSPITAL_COMMUNITY): Payer: No Typology Code available for payment source

## 2011-01-22 LAB — URINE CULTURE: Culture  Setup Time: 201301062129

## 2011-01-22 LAB — CBC
HCT: 26.2 % — ABNORMAL LOW (ref 36.0–46.0)
MCV: 82.4 fL (ref 78.0–100.0)
Platelets: 230 10*3/uL (ref 150–400)
RBC: 3.18 MIL/uL — ABNORMAL LOW (ref 3.87–5.11)
RDW: 17.6 % — ABNORMAL HIGH (ref 11.5–15.5)
WBC: 8.3 10*3/uL (ref 4.0–10.5)

## 2011-01-22 LAB — BASIC METABOLIC PANEL
CO2: 18 mEq/L — ABNORMAL LOW (ref 19–32)
Chloride: 91 mEq/L — ABNORMAL LOW (ref 96–112)
Creatinine, Ser: 6.09 mg/dL — ABNORMAL HIGH (ref 0.50–1.10)
GFR calc Af Amer: 7 mL/min — ABNORMAL LOW (ref >90)
Potassium: 4 mEq/L (ref 3.5–5.1)
Sodium: 120 mEq/L — ABNORMAL LOW (ref 135–145)

## 2011-01-22 SURGERY — INSERTION OF DIALYSIS CATHETER
Anesthesia: Monitor Anesthesia Care

## 2011-01-22 MED ORDER — ROSUVASTATIN CALCIUM 20 MG PO TABS
10.0000 mg | ORAL_TABLET | Freq: Every day | ORAL | Status: DC
  Administered 2011-01-22: 10 mg via ORAL
  Filled 2011-01-22 (×2): qty 1

## 2011-01-22 MED ORDER — SODIUM CHLORIDE 1 G PO TABS
2.0000 g | ORAL_TABLET | Freq: Three times a day (TID) | ORAL | Status: DC
  Administered 2011-01-22 – 2011-01-24 (×3): 2 g via ORAL
  Filled 2011-01-22 (×13): qty 2

## 2011-01-22 MED ORDER — SODIUM CHLORIDE 0.9 % IJ SOLN
INTRAMUSCULAR | Status: AC
  Administered 2011-01-22: 3 mL
  Filled 2011-01-22: qty 3

## 2011-01-22 MED ORDER — FLUCONAZOLE 100 MG PO TABS
150.0000 mg | ORAL_TABLET | Freq: Once | ORAL | Status: AC
  Administered 2011-01-22: 150 mg via ORAL
  Filled 2011-01-22: qty 2

## 2011-01-22 NOTE — Progress Notes (Signed)
Subjective: Interval History: has no complaint of nausea vomiting. Patient is feeling better. She denies any difficulty breathing..  Objective: Vital signs in last 24 hours: Temp:  [96.5 F (35.8 C)-99 F (37.2 C)] 96.5 F (35.8 C) (01/07 1018) Pulse Rate:  [72-89] 76  (01/07 1018) Resp:  [16-20] 18  (01/07 1018) BP: (125-185)/(67-93) 159/73 mmHg (01/07 1018) SpO2:  [94 %-98 %] 96 % (01/07 1018) Weight:  [101.651 kg (224 lb 1.6 oz)] 224 lb 1.6 oz (101.651 kg) (01/07 0427) Weight change: -1.134 kg (-2 lb 8 oz)  Intake/Output from previous day: 01/06 0701 - 01/07 0700 In: 2084 [P.O.:1060; I.V.:904; IV Piggyback:120] Out: 1550 [Urine:1550] Intake/Output this shift: Total I/O In: 200 [P.O.:200] Out: 250 [Urine:250]  General appearance: alert, cooperative and no distress Resp: clear to auscultation bilaterally Cardio: regular rate and rhythm, S1, S2 normal, no murmur, click, rub or gallop GI: soft, non-tender; bowel sounds normal; no masses,  no organomegaly Extremities: edema Has 1+ pitting.  Lab Results:  [facility] 01/22/11 0508  WBC 8.3  HGB 8.7*  HCT 26.2*  PLT 230   BMET:  Basename 01/22/11 0508 01/21/11 0611  NA 120* 123*  K 4.0 4.3  CL 91* 92*  CO2 18* 18*  GLUCOSE 120* 115*  BUN 93* 89*  CREATININE 6.09* 6.29*  CALCIUM 6.3* 6.1*   No results found for this basename: PTH:2 in the last 72 hours Iron Studies: No results found for this basename: IRON,TIBC,TRANSFERRIN,FERRITIN in the last 72 hours  Studies/Results: No results found.  I have reviewed the patient's current medications.  Assessment/Pl  Problem #1 renal failure at this moment seems to be chronic her BUN is 93 creatinine 6.09 slight improvement. Her potassium is 4. Problem #2 hyponatremia this is hypovolemic hyponatremia sodium declining. Patient is still drinking water and the also she has a pitcher of water. Patient has this moment a symptomatic. Problem #3 metabolic acidosis her CO2 is  stable Problem #4 anemia her hemoglobin is 8.7 hematocrit 26. Stable Problem #5 history of hypertension her blood pressure seems to be controlled very well Problem #6 history of charity Problem #7 history of arthritis Problem #8 history of COPD. Plan: We'll DC all her free water           Will increase her sodium tablet 2 g by mouth twice a day          We'll check her basic metabolic panel sodium improves and we'll send her for access placement. For now we'll postpone to access placement.   LOS: 6 days   Trease Bremner S 01/22/2011,10:53 AM

## 2011-01-22 NOTE — Progress Notes (Signed)
CRITICAL VALUE ALERT  Critical value received:  Ca 6.3  Date of notification:  01/22/11  Time of notification:  0622  Critical value read back: yes  Nurse who received alert:  Foye Deer RN  MD notified (1st page):  Dr. Juanetta Gosling  Time of first page:  [phone]  MD notified (2nd page): n/a  Time of second page: n/a  Responding MD:  Dr. Juanetta Gosling  Time MD responded:  [phone]   No action taken at this time previous days value Ca 6.1 01/21/11

## 2011-01-22 NOTE — Progress Notes (Signed)
NAMEMAIRYN, LENAHAN               ACCOUNT NO.:  [id number]  MEDICAL RECORD NO.:  [id number]  LOCATION:  A301                          FACILITY:  [facility]  PHYSICIAN:  Kingsley Callander. [name], MD       DATE OF BIRTH:  [birth year]  DATE OF PROCEDURE: DATE OF DISCHARGE:                                PROGRESS NOTE   SUBJECTIVE:  Ms Silveria is alert and comfortable this morning.  OBJECTIVE:  VITAL SIGNS:  Temperature is 98 with a pulse of 80, respirations 20, blood pressure of 151/93, and oxygen saturation is 96% on room air. LUNGS:  Clear. HEART:  Regular with no murmurs or rubs. ABDOMEN:  Nontender. NEURO:  Stable.  IMPRESSION/PLAN: 1. Chronic kidney disease.  She is being scheduled for placement of a     dialysis catheter today.  Dialysis will proceed following that.     Her BUN and creatinine are 93 and 6.09.  She remains on normal     saline at 50 mL/h with Lasix 200 mg IV twice a day.  Potassium is     4.0.  Bicarb is 18. 2. Hyponatremia.  Serum sodium is 120.  She is on free water     restriction. 3. Hypocalcemia.  Calcium is increased to 6.3, on supplementation. 4. Secondary hyperparathyroidism.  PTH is elevated at 1500. 5. Anemia of chronic renal failure.  Hemoglobin is stable at 8.7. 6. Chronic obstructive pulmonary disease exacerbation, improved.  She     is breathing comfortably.  She is not wheezing.  Continue albuterol     nebs and prednisone which is being tapered.     Kingsley Callander. [name], MD     ROF/MEDQ  D:  01/22/2011  T:  01/22/2011  Job:  [id number]

## 2011-01-22 NOTE — Progress Notes (Signed)
Physical Therapy Treatment Patient Details Name: [name] MRN: [id number] DOB: Sep 23, 1940 Today's Date: 01/22/2011  TIME:1005-1048/35mins TE-40mins GT-70mins TA  PT Assessment/Plan  PT - Assessment/Plan Comments on Treatment Session: Pt completed all exercises well;ambulated 100' RW,supervision with verbal cues to slow down. Pt fatigued upon completion of gait training;Pt able to lateral transfer chair<>BSC<>chair with supervision only PT Goals  Acute Rehab PT Goals PT Goal: Supine/Side to Sit - Progress: Progressing toward goal PT Goal: Ambulate - Progress: Progressing toward goal  PT Treatment Precautions/Restrictions  Precautions Precautions: Fall Restrictions Weight Bearing Restrictions: No Mobility (including Balance) Bed Mobility Supine to Sit: 6: Modified independent (Device/Increase time) Transfers Transfers: Yes Sit to Stand: 5: Supervision Sit to Stand Details (indicate cue type and reason): verbal cues to push from surface when standing Stand to Sit: 5: Supervision Stand to Sit Details: verbal cues for reaching back to surface to control descent Lateral/Scoot Transfers: 5: Supervision Lateral/Scoot Transfer Details (indicate cue type and reason): BSC<>CHAIR Ambulation/Gait Ambulation/Gait: Yes Ambulation/Gait Assistance: 5: Supervision Ambulation/Gait Assistance Details (indicate cue type and reason): verbal cues to slow down Ambulation Distance (Feet): 100 Feet Assistive device: Rolling walker Stairs: No Wheelchair Mobility Wheelchair Mobility: No    Exercise  Total Joint Exercises Ankle Circles/Pumps: Both;15 reps General Exercises - Upper Extremity Shoulder Flexion: Both;10 reps Shoulder Horizontal ABduction: Both;10 reps General Exercises - Lower Extremity Heel Slides: Both;10 reps Toe Raises: Both;15 reps Heel Raises: 15 reps;Both Other Exercises Other Exercises: bridges x10;sit<>stand x3 End of Session PT - End of Session Equipment Utilized  During Treatment: Gait belt Activity Tolerance: Patient tolerated treatment well;Patient limited by fatigue Patient left: in chair;with call bell in reach (chair alarm set) General Behavior During Session: [facility] for tasks performed Cognition: [facility] for tasks performed  [name] ATKINSO 01/22/2011, 11:09 AM

## 2011-01-23 ENCOUNTER — Encounter (HOSPITAL_COMMUNITY): Payer: Self-pay | Admitting: Anesthesiology

## 2011-01-23 ENCOUNTER — Inpatient Hospital Stay (HOSPITAL_COMMUNITY): Payer: Medicare Other | Admitting: Anesthesiology

## 2011-01-23 ENCOUNTER — Inpatient Hospital Stay (HOSPITAL_COMMUNITY): Payer: Medicare Other

## 2011-01-23 ENCOUNTER — Encounter (HOSPITAL_COMMUNITY)
Admission: EM | Disposition: A | Discharge status: Home Health | Payer: Self-pay | Source: Home / Self Care | Attending: Internal Medicine

## 2011-01-23 ENCOUNTER — Encounter (HOSPITAL_COMMUNITY): Payer: Self-pay | Admitting: *Deleted

## 2011-01-23 DIAGNOSIS — N186 End stage renal disease: Secondary | ICD-10-CM

## 2011-01-23 HISTORY — PX: INSERTION OF DIALYSIS CATHETER: SHX1324

## 2011-01-23 LAB — BASIC METABOLIC PANEL
BUN: 93 mg/dL — ABNORMAL HIGH (ref 6–23)
Chloride: 97 mEq/L (ref 96–112)
GFR calc Af Amer: 7 mL/min — ABNORMAL LOW (ref >90)
GFR calc non Af Amer: 6 mL/min — ABNORMAL LOW (ref >90)
Glucose, Bld: 106 mg/dL — ABNORMAL HIGH (ref 70–99)
Potassium: 3.6 mEq/L (ref 3.5–5.1)
Sodium: 128 mEq/L — ABNORMAL LOW (ref 135–145)

## 2011-01-23 LAB — SURGICAL PCR SCREEN: Staphylococcus aureus: NEGATIVE

## 2011-01-23 SURGERY — INSERTION OF DIALYSIS CATHETER
Anesthesia: Monitor Anesthesia Care | Wound class: Clean

## 2011-01-23 MED ORDER — MORPHINE SULFATE 2 MG/ML IJ SOLN: 0.0500 mg/kg | INTRAMUSCULAR | Status: DC | PRN

## 2011-01-23 MED ORDER — SODIUM CHLORIDE 0.9 % IR SOLN
Status: DC | PRN
  Administered 2011-01-23: 1

## 2011-01-23 MED ORDER — PROPOFOL 10 MG/ML IV EMUL
INTRAVENOUS | Status: DC | PRN
  Administered 2011-01-23: 50 ug/kg/min via INTRAVENOUS

## 2011-01-23 MED ORDER — MUPIROCIN 2 % EX OINT
TOPICAL_OINTMENT | CUTANEOUS | Status: AC
  Administered 2011-01-23: 14:00:00
  Filled 2011-01-23: qty 22

## 2011-01-23 MED ORDER — CEFAZOLIN SODIUM-DEXTROSE 2-3 GM-% IV SOLR
2.0000 g | INTRAVENOUS | Status: AC
  Administered 2011-01-23: 2 g via INTRAVENOUS
  Filled 2011-01-23: qty 50

## 2011-01-23 MED ORDER — LIDOCAINE-EPINEPHRINE (PF) 1 %-1:200000 IJ SOLN
INTRAMUSCULAR | Status: DC | PRN
  Administered 2011-01-23: 10 mL

## 2011-01-23 MED ORDER — FENTANYL CITRATE 0.05 MG/ML IJ SOLN
INTRAMUSCULAR | Status: DC | PRN
  Administered 2011-01-23 (×4): 25 ug via INTRAVENOUS

## 2011-01-23 MED ORDER — EPOETIN ALFA 10000 UNIT/ML IJ SOLN
10000.0000 [IU] | INTRAMUSCULAR | Status: DC
  Administered 2011-01-23: 10000 [IU] via INTRAVENOUS
  Filled 2011-01-23 (×2): qty 1

## 2011-01-23 MED ORDER — SODIUM CHLORIDE 0.9 % IR SOLN
Status: DC | PRN
  Administered 2011-01-23: 16:00:00

## 2011-01-23 MED ORDER — HEPARIN SODIUM (PORCINE) 1000 UNIT/ML IJ SOLN
INTRAMUSCULAR | Status: DC | PRN
  Administered 2011-01-23: 4.2 mL

## 2011-01-23 MED ORDER — SODIUM CHLORIDE 0.9 % IV SOLN
INTRAVENOUS | Status: DC | PRN
  Administered 2011-01-23: 15:00:00 via INTRAVENOUS

## 2011-01-23 MED ORDER — MEPERIDINE HCL 25 MG/ML IJ SOLN: 6.2500 mg | INTRAMUSCULAR | Status: DC | PRN

## 2011-01-23 MED ORDER — PROMETHAZINE HCL 25 MG/ML IJ SOLN: 6.2500 mg | INTRAMUSCULAR | Status: DC | PRN

## 2011-01-23 MED ORDER — SODIUM CHLORIDE 0.9 % IJ SOLN
INTRAMUSCULAR | Status: AC
  Filled 2011-01-23: qty 3

## 2011-01-23 MED ORDER — CEFAZOLIN SODIUM 1-5 GM-% IV SOLN: 1.0000 g | Freq: Once | INTRAVENOUS | Status: DC

## 2011-01-23 MED ORDER — HYDROMORPHONE HCL PF 1 MG/ML IJ SOLN
INTRAMUSCULAR | Status: AC
  Filled 2011-01-23: qty 1

## 2011-01-23 MED ORDER — HYDROMORPHONE HCL PF 1 MG/ML IJ SOLN
0.2500 mg | INTRAMUSCULAR | Status: DC | PRN
  Administered 2011-01-23: 0.5 mg via INTRAVENOUS

## 2011-01-23 SURGICAL SUPPLY — 49 items
ADH SKN CLS LQ APL DERMABOND (GAUZE/BANDAGES/DRESSINGS) ×1
BAG DECANTER FOR FLEXI CONT (MISCELLANEOUS) ×2 IMPLANT
CATH CANNON HEMO 15F 50CM (CATHETERS) IMPLANT
CATH CANNON HEMO 15FR 19 (HEMODIALYSIS SUPPLIES) ×1 IMPLANT
CATH CANNON HEMO 15FR 23CM (HEMODIALYSIS SUPPLIES) IMPLANT
CATH CANNON HEMO 15FR 31CM (HEMODIALYSIS SUPPLIES) IMPLANT
CATH CANNON HEMO 15FR 32 (HEMODIALYSIS SUPPLIES) IMPLANT
CATH CANNON HEMO 15FR 32CM (HEMODIALYSIS SUPPLIES) IMPLANT
CLOTH BEACON ORANGE TIMEOUT ST (SAFETY) ×2 IMPLANT
COVER PROBE W GEL 5X96 (DRAPES) IMPLANT
COVER SURGICAL LIGHT HANDLE (MISCELLANEOUS) ×2 IMPLANT
DERMABOND ADHESIVE PROPEN (GAUZE/BANDAGES/DRESSINGS) ×1
DERMABOND ADVANCED .7 DNX6 (GAUZE/BANDAGES/DRESSINGS) IMPLANT
DRAPE C-ARM 42X72 X-RAY (DRAPES) ×2 IMPLANT
DRAPE CHEST BREAST 15X10 FENES (DRAPES) ×2 IMPLANT
GAUZE SPONGE 2X2 8PLY STRL LF (GAUZE/BANDAGES/DRESSINGS) ×1 IMPLANT
GAUZE SPONGE 4X4 16PLY XRAY LF (GAUZE/BANDAGES/DRESSINGS) ×2 IMPLANT
GLOVE BIO SURGEON STRL SZ7.5 (GLOVE) ×2 IMPLANT
GLOVE BIOGEL PI IND STRL 6.5 (GLOVE) IMPLANT
GLOVE BIOGEL PI IND STRL 7.5 (GLOVE) ×1 IMPLANT
GLOVE BIOGEL PI INDICATOR 6.5 (GLOVE) ×1
GLOVE BIOGEL PI INDICATOR 7.5 (GLOVE) ×2
GLOVE ECLIPSE 6.5 STRL STRAW (GLOVE) ×1 IMPLANT
GLOVE ECLIPSE 7.0 STRL STRAW (GLOVE) ×1 IMPLANT
GLOVE SURG SS PI 7.5 STRL IVOR (GLOVE) ×1 IMPLANT
GOWN STRL NON-REIN LRG LVL3 (GOWN DISPOSABLE) ×4 IMPLANT
KIT BASIN OR (CUSTOM PROCEDURE TRAY) ×2 IMPLANT
KIT ROOM TURNOVER OR (KITS) ×2 IMPLANT
NDL 18GX1X1/2 (RX/OR ONLY) (NEEDLE) ×1 IMPLANT
NDL HYPO 25GX1X1/2 BEV (NEEDLE) ×1 IMPLANT
NEEDLE 18GX1X1/2 (RX/OR ONLY) (NEEDLE) ×2 IMPLANT
NEEDLE 22X1 1/2 (OR ONLY) (NEEDLE) ×2 IMPLANT
NEEDLE HYPO 25GX1X1/2 BEV (NEEDLE) ×2 IMPLANT
NS IRRIG 1000ML POUR BTL (IV SOLUTION) ×2 IMPLANT
PACK SURGICAL SETUP 50X90 (CUSTOM PROCEDURE TRAY) ×2 IMPLANT
PAD ARMBOARD 7.5X6 YLW CONV (MISCELLANEOUS) ×4 IMPLANT
SOAP 2 % CHG 4 OZ (WOUND CARE) ×2 IMPLANT
SPONGE GAUZE 2X2 STER 10/PKG (GAUZE/BANDAGES/DRESSINGS) ×1
SUT ETHILON 3 0 PS 1 (SUTURE) ×2 IMPLANT
SUT VICRYL 4-0 PS2 18IN ABS (SUTURE) ×2 IMPLANT
SYR 20CC LL (SYRINGE) ×4 IMPLANT
SYR 30ML LL (SYRINGE) IMPLANT
SYR 5ML LL (SYRINGE) ×4 IMPLANT
SYR CONTROL 10ML LL (SYRINGE) ×2 IMPLANT
SYRINGE 10CC LL (SYRINGE) ×2 IMPLANT
TAPE CLOTH SURG 4X10 WHT LF (GAUZE/BANDAGES/DRESSINGS) ×1 IMPLANT
TOWEL OR 17X24 6PK STRL BLUE (TOWEL DISPOSABLE) ×2 IMPLANT
TOWEL OR 17X26 10 PK STRL BLUE (TOWEL DISPOSABLE) ×2 IMPLANT
WATER STERILE IRR 1000ML POUR (IV SOLUTION) ×2 IMPLANT

## 2011-01-23 NOTE — Progress Notes (Signed)
Subjec  patient her denies any nausea vomiting. She doesn't have any difficulty in breathing. Overall she seems to be doing better.  Objective: Vital signs in last 24 hours: Temp:  [98.2 F (36.8 C)-99.4 F (37.4 C)] 98.2 F (36.8 C) (01/08 0615) Pulse Rate:  [71-83] 72  (01/08 0615) Resp:  [18-20] 18  (01/08 0615) BP: (122-176)/(69-92) 161/78 mmHg (01/08 0615) SpO2:  [92 %-97 %] 96 % (01/08 0709) Weight:  [102.2 kg (225 lb 5 oz)] 225 lb 5 oz (102.2 kg) (01/08 0615) Weight change: 0.549 kg (1 lb 3.4 oz)  Intake/Output from previous day: 01/07 0701 - 01/08 0700 In: 440 [P.O.:440] Out: 1250 [Urine:1250] Intake/Output this shift:    General appearance: alert, cooperative and no distress Resp: clear to auscultation bilaterally Cardio: regular rate and rhythm, S1, S2 normal, no murmur, click, rub or gallop GI: soft, non-tender; bowel sounds normal; no masses,  no organomegaly Extremities: edema Her she has trace edema bilaterally.  Lab Results:  [facility] 01/22/11 0508  WBC 8.3  HGB 8.7*  HCT 26.2*  PLT 230   BMET:  Basename 01/23/11 0523 01/22/11 0508  NA 128* 120*  K 3.6 4.0  CL 97 91*  CO2 19 18*  GLUCOSE 106* 120*  BUN 93* 93*  CREATININE 6.05* 6.09*  CALCIUM 6.7* 6.3*   No results found for this basename: PTH:2 in the last 72 hours Iron Studies: No results found for this basename: IRON,TIBC,TRANSFERRIN,FERRITIN in the last 72 hours  Studies/Results: No results found.  I have reviewed the patient's current medications.  Assessment/Plan: Problem #1 renal failure presently reaching end-stage her BUN is 93 creatinine 6.05 there is no significant change. Patient has this moment denies any nausea vomiting. Problem #2 history of CHF patient is on diuretics presently she is nonoliguric clinically she has improved. Problem #3 hyponatremia this is secondary to hypervolemic hyponatremia sodium 128 has that also has improved. Presently she is on freewater restriction and  also on sodium tablets. Problem #4 history of anemia secondary to chronic renal failure her hemoglobin is 8.7 hematocrit and 26.2 stable Problem # 5 history of COPD  Problem #6 history history his operative disorder of Problem #7 history of GERD Problem #8 history of arthritis We'll make arrangements for patient to get catheter placement today and then we'll consider dialyzing patient. This is end of her her followup thank you    LOS: 7 days   Brentyn Seehafer S 01/23/2011,11:06 AM

## 2011-01-23 NOTE — Anesthesia Preprocedure Evaluation (Signed)
Anesthesia Evaluation  Patient identified by MRN, date of birth, ID band Patient awake    Reviewed: Allergy & Precautions, H&P   Airway Mallampati: II      Dental   Pulmonary asthma ,  clear to auscultation        Cardiovascular hypertension, Regular Normal    Neuro/Psych Depression    GI/Hepatic Neg liver ROS, GERD-  ,  Endo/Other  Negative Endocrine ROS  Renal/GU negative Renal ROS     Musculoskeletal negative musculoskeletal ROS (+)   Abdominal   Peds  Hematology negative hematology ROS (+)   Anesthesia Other Findings   Reproductive/Obstetrics                           Anesthesia Physical Anesthesia Plan  ASA: III  Anesthesia Plan: MAC   Post-op Pain Management:    Induction: Intravenous  Airway Management Planned: Mask  Additional Equipment:   Intra-op Plan:   Post-operative Plan:   Informed Consent:   Plan Discussed with:   Anesthesia Plan Comments:         Anesthesia Quick Evaluation

## 2011-01-23 NOTE — H&P (Signed)
[facility] and [facility] Specialist of [facility]  Patient name: [name] MRN: [id number] DOB: 25-Mar-1940 Sex: female  CC: Needs catheter for HD.  HPI: [name] is a 71 y.o. female who is an inpatient at [facility]. We were asked to place a tunneled dialysis catheter for dialysis.   Past Medical History  Diagnosis Date  . Hypertension   . Hypercholesterolemia   . Anemia   . GERD (gastroesophageal reflux disease)   . Asthma   . Osteoporosis   . Chronic kidney disease   . Anemia of chronic disease 08/07/2007  . Renal insufficiency 08/03/2010  . OSTEOPOROSIS 08/07/2007  . Peripheral neuropathy 08/03/2010  . Schizoaffective disorder   . Arthritis     History reviewed. No pertinent family history.  SOCIAL HISTORY: History  Substance Use Topics  . Smoking status: Never Smoker   . Smokeless tobacco: Not on file  . Alcohol Use: No    Allergies  Allergen Reactions  . Ferrous Gluconate Nausea Only    hypotension    Current Facility-Administered Medications  Medication Dose Route Frequency Provider Last Rate Last Dose  . 0.9 %  sodium chloride infusion   Intravenous Continuous Jamse Mead, MD 20 mL/hr at 01/23/11 1434    . acetaminophen (TYLENOL) tablet 650 mg  650 mg Oral Q6H PRN Vania Rea       Or  . acetaminophen (TYLENOL) suppository 650 mg  650 mg Rectal Q6H PRN Vania Rea      . albuterol (PROVENTIL) (5 MG/ML) 0.5% nebulizer solution 2.5 mg  2.5 mg Nebulization Q6H Roy Fagan   2.5 mg at 01/23/11 0709  . albuterol (PROVENTIL) (5 MG/ML) 0.5% nebulizer solution 2.5 mg  2.5 mg Nebulization Q2H PRN Carylon Perches      . ALPRAZolam Prudy Feeler) tablet 0.5 mg  0.5 mg Oral BID Fredirick Maudlin, MD   0.5 mg at 01/22/11 2346  . amLODipine (NORVASC) tablet 10 mg  10 mg Oral Daily Roy Fagan   10 mg at 01/22/11 1142  . antiseptic oral rinse (BIOTENE) solution 15 mL  15 mL Mouth Rinse BID Roy Fagan   15 mL at 01/23/11 0900  . calcitRIOL (ROCALTROL) capsule 0.5  mcg  0.5 mcg Oral Daily Jamse Mead, MD   0.5 mcg at 01/22/11 1205  . calcium carbonate (TUMS - dosed in mg elemental calcium) chewable tablet 800 mg of elemental calcium  800 mg of elemental calcium Oral TID WC Jamse Mead, MD   800 mg of elemental calcium at 01/22/11 1805  . ceFAZolin (ANCEF) IVPB 2 g/50 mL premix  2 g Intravenous To SSTC Regina J Roczniak, PA      . citalopram (CELEXA) tablet 20 mg  20 mg Oral Daily Vania Rea   20 mg at 01/22/11 1142  . enoxaparin (LOVENOX) injection 30 mg  30 mg Subcutaneous Q24H Vania Rea   30 mg at 01/22/11 1206  . epoetin alfa (EPOGEN,PROCRIT) injection 10,000 Units  10,000 Units Subcutaneous Once Jamse Mead, MD      . fluconazole (DIFLUCAN) tablet 150 mg  150 mg Oral Once Roy Fagan   150 mg at 01/22/11 1828  . furosemide (LASIX) 200 mg in dextrose 5 % 50 mL IVPB  200 mg Intravenous BID Jamse Mead, MD   200 mg at 01/23/11 1036  . gabapentin (NEURONTIN) capsule 300 mg  300 mg Oral QHS Leopold Campbell   300 mg at 01/22/11 2346  . HYDROmorphone (DILAUDID) injection  0.5 mg  0.5 mg Intravenous Q2H PRN Vania Rea   0.5 mg at 01/16/11 1835  . ipratropium (ATROVENT) nebulizer solution 0.5 mg  0.5 mg Nebulization Q6H Roy Fagan   0.5 mg at 01/23/11 0709  . labetalol (NORMODYNE) tablet 100 mg  100 mg Oral BID Vania Rea   100 mg at 01/23/11 1427  . mupirocin ointment (BACTROBAN) 2 %           . ondansetron (ZOFRAN) tablet 4 mg  4 mg Oral Q6H PRN Vania Rea       Or  . ondansetron [facility]) injection 4 mg  4 mg Intravenous Q6H PRN Vania Rea      . predniSONE (DELTASONE) tablet 20 mg  20 mg Oral Q breakfast Carylon Perches   20 mg at 01/22/11 0818  . risperiDONE (RISPERDAL) tablet 0.5 mg  0.5 mg Oral Q2000 Leopold Campbell   0.5 mg at 01/22/11 1956  . rosuvastatin (CRESTOR) tablet 10 mg  10 mg Oral [address] [place], PHARMD   10 mg at 01/22/11 1804  . sodium bicarbonate tablet 650 mg  650 mg Oral  BID Jamse Mead, MD   650 mg at 01/22/11 2345  . sodium chloride 0.9 % injection        3 mL at 01/22/11 2346  . sodium chloride 0.9 % injection           . sodium chloride tablet 2 g  2 g Oral TID WC Jamse Mead, MD   2 g at 01/22/11 1828  . traMADol (ULTRAM) tablet 50 mg  50 mg Oral BID Vania Rea   50 mg at 01/22/11 2346  . traZODone (DESYREL) tablet 25 mg  25 mg Oral QHS PRN Vania Rea   25 mg at 01/23/11 0019  . DISCONTD: ceFAZolin (ANCEF) IVPB 1 g/50 mL premix  1 g Intravenous Once Marlowe Shores, PA       Facility-Administered Medications Ordered in Other Encounters  Medication Dose Route Frequency Provider Last Rate Last Dose  . cyanocobalamin ((VITAMIN B-12)) injection 1,000 mcg  1,000 mcg Intramuscular Once Beryle Beams, MD        REVIEW OF SYSTEMS: Arly.Keller ] denotes positive finding; [  ] denotes negative finding CARDIOVASCULAR:  [ ]  chest pain   [ ]  chest pressure   [ ]  palpitations   Arly.Keller ] orthopnea   [ ]  dyspnea on exertion   [ ]  claudication   [ ]  rest pain   [ ]  DVT   [ ]  phlebitis PULMONARY:   [ ]  productive cough   [ ]  asthma   [ ]  wheezing NEUROLOGIC:   [ ]  weakness  [ ]  paresthesias  [ ]  aphasia  [ ]  amaurosis  [ ]  dizziness HEMATOLOGIC:   [ ]  bleeding problems   [ ]  clotting disorders MUSCULOSKELETAL:  [ ]  joint pain   [ ]  joint swelling [ ]  leg swelling GASTROINTESTINAL: [ ]   blood in stool  [ ]   hematemesis GENITOURINARY:  [ ]   dysuria  [ ]   hematuria PSYCHIATRIC:  [ ]  history of major depression INTEGUMENTARY:  [ ]  rashes  [ ]  ulcers CONSTITUTIONAL:  [ ]  fever   [ ]  chills  PHYSICAL EXAM: Filed Vitals:   01/23/11 0203 01/23/11 0615 01/23/11 0709 01/23/11 1404  BP: 162/81 161/78  158/88  Pulse: 78 72  72  Temp: 99.4 F (37.4 C) 98.2 F (36.8 C)  98.2 F (36.8 C)  TempSrc: Oral  Oral  Oral  Resp: 20 18  18   Height:      Weight:  225 lb 5 oz (102.2 kg)    SpO2: 97% 92% 96% 97%   Body mass index is 45.51 kg/(m^2). GENERAL: The  patient is an obese female, in no acute distress. The vital signs are documented above. CARDIOVASCULAR: There is a regular rate and rhythm without significant murmur appreciated.  PULMONARY: There is good air exchange bilaterally without wheezing or rales. ABDOMEN: Soft and non-tender with normal pitched bowel sounds.  MUSCULOSKELETAL: There are no major deformities or cyanosis. NEUROLOGIC: No focal weakness or paresthesias are detected. SKIN: There are no ulcers or rashes noted. PSYCHIATRIC: The patient has a normal affect.    MEDICAL ISSUES: For placement of dialysis catheter today. I have discussed the procedure and potential complications, including, but not limited to bleeding, arterial injury, or infection. All of her questions were answered and she is agreeable to proceed.   Jadore Mcguffin S [facility] [place] Office: [phone]

## 2011-01-23 NOTE — Transfer of Care (Signed)
Immediate Anesthesia Transfer of Care Note  Patient: [name]  Procedure(s) Performed:  INSERTION OF DIALYSIS CATHETER - Insertion of Dialysis Catheter- Right Internal Jugular  Patient Location: PACU  Anesthesia Type: MAC  Level of Consciousness: awake, oriented and patient cooperative  Airway & Oxygen Therapy: Patient Spontanous Breathing  Post-op Assessment: Report given to PACU RN and Post -op Vital signs reviewed and stable  Post vital signs: Reviewed  Complications: No apparent anesthesia complications

## 2011-01-23 NOTE — Preoperative (Signed)
Beta Blockers   Reason not to administer Beta Blockers:Not Applicable, took Labetalol today

## 2011-01-23 NOTE — Progress Notes (Signed)
Spoke with Dr Randa Evens an she was comfortable with the K+ from today and the CBC from yesterday, so no I stat will be done.

## 2011-01-23 NOTE — Progress Notes (Signed)
Pt dentures given to Vernona Rieger, RN, AD in PACU. Dentures were labeled in denture cup. Note placed on front of chart as a reminder for them to be returned to pt after surgery

## 2011-01-23 NOTE — Progress Notes (Signed)
NAMEKITARA, [name]               ACCOUNT NO.:  [id number]  MEDICAL RECORD NO.:  [id number]  LOCATION:  A301                          FACILITY:  [facility]  PHYSICIAN:  Kingsley Callander. [name], MD       DATE OF BIRTH:  [birth year]  DATE OF PROCEDURE: DATE OF DISCHARGE:                                PROGRESS NOTE   [name] is alert and comfortable this morning.  She is having no difficulty breathing.  IV access was lost last night.  VITAL SIGNS:  Temperature 98.2, pulse 72, respirations 18, blood pressure 161/78, oxygen saturation 96% on room air. LUNGS:  Clear. HEART:  Regular with no murmurs or rubs. ABDOMEN:  Soft and nontender. NEURO:  Stable.  IMPRESSION/PLAN: 1. Chronic kidney disease.  BUN and creatinine are stable at 93 and     6.05.  She was unable to go to [facility] to have her dialysis     catheter placed yesterday.  Hopefully, this can be done today. 2. Hyponatremia.  Serum sodium has improved to 128. 3. Hypocalcemia, improved to 6.7. 4. Chronic obstructive pulmonary disease.  Continue nebs and     prednisone taper. 5. Schizoaffective disorder, stable. 6. Hypertension.     Kingsley Callander. [name], MD     ROF/MEDQ  D:  01/23/2011  T:  01/23/2011  Job:  [id number]

## 2011-01-23 NOTE — Op Note (Signed)
01/23/2011  PREOP DIAGNOSIS: Chronic kidney disease  POSTOP DIAGNOSIS: Chronic kidney disease  PROCEDURE: Ultrasound guided placement of Right IJ dietetic catheter  SURGEON: Di Kindle. Edilia Bo, MD, FACS  ASSIST: none  ANESTHESIA: local with sedation   EBL: minimal  FINDINGS: patent right IJ  INDICATIONS: for hemodialysis  TECHNIQUE: The patient was taken to the operating room and sedated by anesthesia. The neck and upper chest were prepped and draped in the usual sterile fashion. After the skin was anesthetized with 1% lidocaine, and under ultrasound guidance, the right IJ was cannulated and a guidewire introduced into the superior vena cava under fluoroscopic control. The tract over the wire was dilated and then the dilator and peel-away sheath were passed over the wire and the wire and dilator removed. The catheter was passed through the peel-away sheath and positioned in the right atrium. The exit site for the catheter was selected and the skin anesthetized between the 2 areas. The catheter was then brought through the tunnel, cut to the appropriate length, and the distal ports were attached. Both ports withdrew easily, were then flushed with heparinized saline and filled with concentrated heparin. The catheter was secured at its exit site with a 3-0 nylon suture. The IJ cannulation site was closed with a 4-0 subcuticular stitch. A sterile dressing was applied. The patient tolerated the procedure well and was transferred to the recovery room in stable condition. All needle and sponge counts were correct.  Waverly Ferrari, MD, FACS [facility] [place]  DATE OF OPERATION: 01/23/2011 DATE OF DICTATION: 01/23/2011

## 2011-01-23 NOTE — Anesthesia Postprocedure Evaluation (Signed)
  Anesthesia Post-op Note  Patient: [name]  Procedure(s) Performed:  INSERTION OF DIALYSIS CATHETER - Insertion of Dialysis Catheter- Right Internal Jugular  Patient Location: PACU  Anesthesia Type: MAC  Level of Consciousness: awake  Airway and Oxygen Therapy: Patient Spontanous Breathing  Post-op Pain: mild  Post-op Assessment: Post-op Vital signs reviewed  Post-op Vital Signs: stable  Complications: No apparent anesthesia complications

## 2011-01-23 NOTE — Progress Notes (Signed)
Pt taken to OR before beta blocker obtained and given.  CXR also not completed.  Pam in OR aware and will take care of it over in holding.

## 2011-01-24 ENCOUNTER — Encounter (HOSPITAL_COMMUNITY): Payer: Self-pay | Admitting: Vascular Surgery

## 2011-01-24 ENCOUNTER — Inpatient Hospital Stay (HOSPITAL_COMMUNITY): Payer: Medicare Other

## 2011-01-24 MED ORDER — ALPRAZOLAM 0.5 MG PO TABS: 0.5000 mg | ORAL_TABLET | Freq: Two times a day (BID) | ORAL | Status: DC

## 2011-01-24 MED ORDER — CALCIUM CARBONATE ANTACID 500 MG PO CHEW: 4.0000 | CHEWABLE_TABLET | Freq: Three times a day (TID) | ORAL | Status: DC

## 2011-01-24 MED ORDER — IPRATROPIUM BROMIDE 0.02 % IN SOLN
0.5000 mg | Freq: Four times a day (QID) | RESPIRATORY_TRACT | Status: DC
  Administered 2011-01-24: 0.5 mg via RESPIRATORY_TRACT
  Filled 2011-01-24 (×2): qty 2.5

## 2011-01-24 MED ORDER — ALBUTEROL SULFATE (5 MG/ML) 0.5% IN NEBU
2.5000 mg | INHALATION_SOLUTION | Freq: Four times a day (QID) | RESPIRATORY_TRACT | Status: DC
  Administered 2011-01-24: 2.5 mg via RESPIRATORY_TRACT
  Filled 2011-01-24 (×2): qty 0.5

## 2011-01-24 MED ORDER — PREDNISONE 20 MG PO TABS: ORAL_TABLET | ORAL | Status: DC

## 2011-01-24 MED ORDER — HEPARIN SODIUM (PORCINE) 1000 UNIT/ML IJ SOLN
2000.0000 [IU] | Freq: Once | INTRAMUSCULAR | Status: AC
  Administered 2011-01-24: 2000 [IU] via INTRAVENOUS

## 2011-01-24 MED ORDER — SIMVASTATIN 80 MG PO TABS: ORAL_TABLET | ORAL | Status: DC

## 2011-01-24 MED ORDER — CALCIUM CARBONATE ANTACID 500 MG PO CHEW: 1.0000 | CHEWABLE_TABLET | ORAL | Status: DC | PRN

## 2011-01-24 MED ORDER — CALCITRIOL 0.5 MCG PO CAPS: 0.5000 ug | ORAL_CAPSULE | Freq: Every day | ORAL | Status: DC

## 2011-01-24 MED ORDER — HYDROCODONE-ACETAMINOPHEN 5-325 MG PO TABS: 1.0000 | ORAL_TABLET | Freq: Four times a day (QID) | ORAL | Status: DC | PRN

## 2011-01-24 MED ORDER — AMLODIPINE BESYLATE 10 MG PO TABS: 10.0000 mg | ORAL_TABLET | Freq: Every day | ORAL | Status: DC

## 2011-01-24 MED ORDER — TORSEMIDE 100 MG PO TABS: ORAL_TABLET | ORAL | Status: DC

## 2011-01-24 NOTE — Progress Notes (Signed)
01/24/11 1624 patient being discharged home with family. Reviewed discharge instructions with patient and daughter, given copy of instructions, med list, f/u appointment information. Outpatient dialysis set up to start Saturday, prescriptions called in to pharmacy per dr fagan. Denied pain or discomfort prior to discharge. Home health for RN, PT, and aide set up with [facility] per case management. Pt left floor in stable condition via w/c accompanied by nurse and grandson.

## 2011-01-24 NOTE — Discharge Summary (Signed)
[name], [name]               ACCOUNT NO.:  [id number]  MEDICAL RECORD NO.:  [id number]  LOCATION:  A301                          FACILITY:  [facility]  PHYSICIAN:  Kingsley Callander. [name], MD       DATE OF BIRTH:  1940/10/24  DATE OF ADMISSION:  01/16/2011 DATE OF DISCHARGE:  01/09/2013LH                              DISCHARGE SUMMARY   DISCHARGE DIAGNOSES: 1. End-stage renal disease. 2. Anemia of chronic disease. 3. Hyponatremia. 4. Hypocalcemia. 5. Hyperkalemia. 6. Metabolic acidosis. 7. Hyperlipidemia. 8. Schizoaffective disorder. 9. Chronic obstructive pulmonary disease. 10.Peripheral neuropathy. 11.Secondary hyperparathyroidism.  PROCEDURES:  Placement of a right internal jugular dialysis catheter.  HOSPITAL COURSE:  This patient is a 71 year old African American female with chronic kidney disease and COPD who presented with difficulty breathing.  Chest x-ray revealed no evidence of edema.  She was treated for COPD exacerbation with nebulizer treatments and Solu-Medrol followed by prednisone.  Chest x-ray revealed no evidence of pneumonia.  Her wheezing resolved with treatment.  Prednisone is being tapered.  She was anemic on admission with a hemoglobin of 8.8.  She had recently been transfused 2 units of packed red cells as an outpatient.  She has received Procrit.  Chronic kidney disease was worsening.  BUN and creatinine were 67 and 6.8, BUN climbed to 93.  She was seen in Nephrology consultation by Dr. [name].  She was treated with fluids and Lasix.  She ultimately had a right IJ dialysis catheter placed and was started on dialysis on January 23, 2011.  She was dialyzed again on January 24, 2011.  She had associated abnormalities of hyperkalemia, hypocalcemia, and metabolic acidosis. She was treated with Kayexalate, calcium, and fluids on admission due to a potassium level of 6.2.  She had been on ACE inhibitor therapy which has been discontinued.  She will be continued on  Demadex as an outpatient and will have dialysis again in 3 days.  Hypertension was treated with Lasix, amlodipine, and labetalol.  Schizoaffective disorder has been stable on risperidone and citalopram.  She has peripheral neuropathy which has been treated with gabapentin.  There has been some confusion regarding some of her home medications. Her lipid-lowering therapy will be modified to simvastatin 20 mg daily. She will not be on Caduet.  She has osteoarthritis and is treated with tramadol on a p.r.n. basis.  Her calcium therapy has been modified to 4 Tums 3 times a day with meals, calcitriol has been added.  Her PTH was quite elevated at 1500.  Hyponatremia has improved to 128.  Her discharge hemoglobin is 8.7, discharge potassium is 3.6 with a sodium of 128 and calcium of 6.7.  BUN and creatinine are 93 and 6.05, bicarb is 19.  The patient's condition is significantly improved.  At discharge, she will have followup with Nephrology in 3 days and will follow up in my office in 1 week.  DISCHARGE MEDICATIONS: 1. ProAir 2 puffs q.i.d. 2. Alprazolam 0.5 mg b.i.d. 3. Amlodipine 10 mg daily. 4. Calcitriol 0.5 mcg daily. 5. Tums 4 t.i.d. with meals. 6. Citalopram 20 mg daily. 7. Demadex 50 mg daily. 8. Gabapentin 300 mg at bedtime. 9. Norco 5/325 q.6h  p.r.n. 10.Labetalol 100 mg b.i.d. 11.Prednisone 10 mg daily for 4 days, then 5 mg daily for 4 days, then     stop. 12.Risperidone 0.5 mg daily at 8 p.m. 13.Simvastatin 20 mg at bedtime. 14.Tramadol 50 mg b.i.d. p.r.n.     Kingsley Callander. [name], MD     ROF/MEDQ  D:  01/24/2011  T:  01/24/2011  Job:  [id number]  cc:   Jorja Loa, M.D. Fax: [phone]  Ladona Horns. Mariel Sleet, MD Fax: [phone]

## 2011-01-24 NOTE — Progress Notes (Addendum)
[name]  MRN: [id number]  DOB/AGE: 1940-02-06 71 y.o.  Primary Care Physician:FAGAN,ROY, MD  Admit date: 01/16/2011  Chief Complaint:  Chief Complaint  Patient presents with  . Shortness of Breath  . Wheezing  . Urinary Retention  . Dysuria  . Altered Mental Status  . Edema    S-Pt presented on  01/16/2011 with  Chief Complaint  Patient presents with  . Shortness of Breath  . Wheezing  . Urinary Retention  . Dysuria  . Altered Mental Status  . Edema  .  Pt today feels better. Pt seen on HD-machine Pt is tolerating her dialysis   Meds     . albuterol  2.5 mg Nebulization Q6H WA  . ALPRAZolam  0.5 mg Oral BID  . amLODipine  10 mg Oral Daily  . antiseptic oral rinse  15 mL Mouth Rinse BID  . calcitRIOL  0.5 mcg Oral Daily  . calcium carbonate  800 mg of elemental calcium Oral TID WC  . ceFAZolin (ANCEF) IV  2 g Intravenous To SSTC  . citalopram  20 mg Oral Daily  . enoxaparin  30 mg Subcutaneous Q24H  . epoetin alfa  10,000 Units Intravenous 3 times weekly  . furosemide  200 mg Intravenous BID  . gabapentin  300 mg Oral QHS  . heparin  2,000 Units Intravenous Once  . HYDROmorphone      . ipratropium  0.5 mg Nebulization Q6H WA  . labetalol  100 mg Oral BID  . mupirocin ointment      . predniSONE  20 mg Oral Q breakfast  . risperiDONE  0.5 mg Oral Q2000  . rosuvastatin  10 mg Oral q1800  . sodium bicarbonate  650 mg Oral BID  . sodium chloride      . sodium chloride  2 g Oral TID WC  . traMADol  50 mg Oral BID  . DISCONTD: albuterol  2.5 mg Nebulization Q6H  . DISCONTD: ceFAZolin (ANCEF) IV  1 g Intravenous Once  . DISCONTD: epoetin alfa  10,000 Units Subcutaneous Once  . DISCONTD: ipratropium  0.5 mg Nebulization Q6H         QVZ:DGLOV from the symptoms mentioned above,there are no other symptoms referable to all systems reviewed.  Physical Exam: Vital signs in last 24 hours: Temp:  [97.4 F (36.3 C)-98.8 F (37.1 C)] 98.4 F (36.9 C)  (01/09 1105) Pulse Rate:  [67-87] 86  (01/09 1106) Resp:  [12-20] 20  (01/09 1106) BP: (130-179)/(67-92) 179/80 mmHg (01/09 1106) SpO2:  [93 %-97 %] 94 % (01/09 1105) Weight:  [214 lb 15.2 oz (97.5 kg)-224 lb 10.4 oz (101.9 kg)] 219 lb 5.7 oz (99.5 kg) (01/09 1105) Weight change: -10.6 oz (-0.3 kg) Last BM Date: 01/23/11  Intake/Output from previous day: 01/08 0701 - 01/09 0700 In: 2284 [I.V.:2284] Out: 1650 [Urine:650]     Physical Exam: General- pt is awake,alert, oriented to time place and person Resp- No acute REsp distress, CTA B/L NO Rhonchi CVS- S1S2 regular in rate and rhythm GIT- BS+, soft, NT, ND EXT- Minimal LE Edema,  NO Cyanosis Access- Right IJ- PC insitu   Lab Results: CBC  Basename 01/22/11 0508  WBC 8.3  HGB 8.7*  HCT 26.2*  PLT 230    BMET  Basename 01/23/11 0523 01/22/11 0508  NA 128* 120*  K 3.6 4.0  CL 97 91*  CO2 19 18*  GLUCOSE 106* 120*  BUN 93* 93*  CREATININE 6.05* 6.09*  CALCIUM 6.7*  6.3*    MICRO Recent Results (from the past 240 hour(s))  URINE CULTURE     Status: Normal   Collection Time   01/16/11  1:27 AM      Component Value Range Status Comment   Specimen Description URINE, CATHETERIZED   Final    Special Requests NONE   Final    Setup Time [id number]   Final    Colony Count NO GROWTH   Final    Culture NO GROWTH   Final    Report Status 01/17/2011 FINAL   Final   URINE CULTURE     Status: Normal   Collection Time   01/21/11  4:46 AM      Component Value Range Status Comment   Specimen Description URINE, CLEAN CATCH   Final    Special Requests NONE   Final    Setup Time [id number]   Final    Colony Count 20,OOO COLONIES/ML   Final    Culture PROTEUS MIRABILIS   Final    Report Status 01/23/2011 FINAL   Final    Organism ID, Bacteria PROTEUS MIRABILIS   Final   SURGICAL PCR SCREEN     Status: Normal   Collection Time   01/23/11  1:45 PM      Component Value Range Status Comment   MRSA, PCR NEGATIVE  NEGATIVE   Final    Staphylococcus aureus NEGATIVE  NEGATIVE  Final       Lab Results  Component Value Date   PTH 1500.0* 01/17/2011   CALCIUM 6.7* 01/23/2011   PHOS 3.8 01/24/2011          Impression: 1)Renal  CKD stage 5==> Now ESRD                 Pt was  having uremic symptoms of decreased appetite                 Pt had her Catheter placed for access yesterday.                 Pt now on  Dialysis today                Pt seen on dialysis-Pt tolerating  treatment well.  2)HTN            Target Organ damage            CKD          Medication-            On Alpha and beta Blockers   3)Anemia HGb not at goal (9--11)                Ont epo w dialysis  4)CKD Mineral-Bone Disorder PTH elevated  Secondary Hyperparathyroidism present - ON Calcitriol Phosphorus nearly at goal- on binders Hypocalemia- improving- should be ebtter with Dilaysis  5)Resp- COPD  6)FEN            Normokalemic          Hyponatremic- improving           Hypervolemic  7)Acid base Co2 not at goal- on po Bicarb- improving  8) Resp COPD stable   Plan:  Agree with current treatment and plan Pt can be safely d/ced from nephrology stand point. Pt will be dialyzed as outpt starting this Saturday at [facility].      BHUTANI,MANPREET S 01/24/2011, 11:26 AM

## 2011-01-24 NOTE — Progress Notes (Signed)
[name], [name]               ACCOUNT NO.:  [id number]  MEDICAL RECORD NO.:  [id number]  LOCATION:  A301                          FACILITY:  [facility]  PHYSICIAN:  Kingsley Callander. [name], MD       DATE OF BIRTH:  1940/09/30  DATE OF PROCEDURE: DATE OF DISCHARGE:  01/24/2011                                PROGRESS NOTE   [name] had a right internal jugular dialysis catheter placed yesterday and underwent dialysis for 2 hours yesterday evening she reports.  She denies any untoward effects.  She wants to go home.  She is breathing comfortably.  PHYSICAL EXAMINATION:  VITAL SIGNS:  Temperature 98.8, pulse 82, respirations 20, blood pressure 162/80, oxygen saturation 95% on room air.  She has a negative fluid balance yesterday of 900 mL. LUNGS:  Clear.  Her chest x-ray yesterday revealed no pneumothorax.  The catheter tip was at the cavoatrial junction. HEART:  Regular with no murmurs.  No rubs. ABDOMEN:  Soft, nontender. NEURO:  Stable.  IMPRESSION/PLAN: 1. End-stage renal disease.  Plan his dialysis again today and     possibly discharge to home in the near future.  Medications will be     modified from her home medications. 2. Chronic obstructive pulmonary disease exacerbation, improved.  She     will continue a metered dose albuterol inhaler at home, and will     finish her prednisone taper. 3. Schizo-affective disorder, stable. 4. Hypertension.  Continue current treatment. 5. Hyponatremia.  Repeat sodium pending. 6. Hypocalcemia.  Repeat calcium pending.     Kingsley Callander. [name], MD     ROF/MEDQ  D:  01/24/2011  T:  01/24/2011  Job:  [id number]

## 2011-01-24 NOTE — Progress Notes (Signed)
CARE MANAGEMENT NOTE 01/24/2011  Patient:  [name], [name]   Account Number:  [id number]  Date Initiated:  01/18/2011  Documentation initiated by:  Rosemary Holms  Subjective/Objective Assessment:   Pt admitted with COPD. PTA, lived at home with family.     Action/Plan:   Spoke to daughter and pt this morning. Pt was scheduled for Outpt PT next week. Awaiting progress report from PT to decide on [facility] PT or Outpt PT.   Anticipated DC Date:  01/24/2011   Anticipated DC Plan:  HOME W HOME HEALTH SERVICES      DC Planning Services  CM consult      Choice offered to / List presented to:  C-4 Adult Children        HH arranged  HH-1 RN  HH-2 PT  HH-4 NURSE'S AIDE      HH agency  [facility]   Status of service:  Completed, signed off Medicare Important Message given?   (If response is "NO", the following Medicare IM given date fields will be blank) Date Medicare IM given:   Date Additional Medicare IM given:    Discharge Disposition:  HOME W HOME HEALTH SERVICES  Per UR Regulation:    Comments:  01/24/11 1200 Westley Blass Leanord Hawking RN BSN CM HH arranged with [facility]  01/24/2011 KJ  arrnangements made for dialysis at [facility] [place] for outpt dialysis appropriate information sent to [facility]. pt  has an appointment on friday at 9:30 to sign papers and will have dialysis on saturday 01/22/11 1115 Briele Lagasse RN BSN CM Due to sodium level, placement of cath for dialysis will be delayed until tomorrow. CM to follow for [facility] needs.  01/19/11 1530 Skyy Nilan Leanord Hawking RN BSN CM Spoke w/ pt who was unable to tell CM which agency for [facility] they would prefer.  01/18/11 900 Chett Taniguchi Leanord Hawking RN BSN CM

## 2011-02-21 ENCOUNTER — Ambulatory Visit: Payer: Self-pay | Admitting: Vascular Surgery

## 2011-02-21 LAB — BASIC METABOLIC PANEL
Anion Gap: 10 (ref 7–16)
Calcium, Total: 7.6 mg/dL — ABNORMAL LOW (ref 8.5–10.1)
Co2: 25 mmol/L (ref 21–32)
Creatinine: 7.58 mg/dL — ABNORMAL HIGH (ref 0.60–1.30)
EGFR (African American): 7 — ABNORMAL LOW
Glucose: 94 mg/dL (ref 65–99)
Osmolality: 284 (ref 275–301)
Sodium: 139 mmol/L (ref 136–145)

## 2011-02-21 LAB — CBC
HCT: 25.5 % — ABNORMAL LOW (ref 35.0–47.0)
MCH: 28.5 pg (ref 26.0–34.0)
Platelet: 237 10*3/uL (ref 150–440)
RBC: 2.84 10*6/uL — ABNORMAL LOW (ref 3.80–5.20)
WBC: 6.8 10*3/uL (ref 3.6–11.0)

## 2011-02-27 ENCOUNTER — Encounter (HOSPITAL_COMMUNITY): Payer: Self-pay | Admitting: Psychiatry

## 2011-02-27 ENCOUNTER — Ambulatory Visit (INDEPENDENT_AMBULATORY_CARE_PROVIDER_SITE_OTHER): Payer: Medicare Other | Admitting: Psychiatry

## 2011-02-27 DIAGNOSIS — F259 Schizoaffective disorder, unspecified: Secondary | ICD-10-CM

## 2011-02-27 DIAGNOSIS — F200 Paranoid schizophrenia: Secondary | ICD-10-CM

## 2011-02-27 MED ORDER — ALPRAZOLAM 0.5 MG PO TABS: 0.5000 mg | ORAL_TABLET | Freq: Two times a day (BID) | ORAL | Status: DC

## 2011-02-27 MED ORDER — CITALOPRAM HYDROBROMIDE 20 MG PO TABS: 20.0000 mg | ORAL_TABLET | Freq: Every day | ORAL | Status: DC

## 2011-02-27 MED ORDER — RISPERIDONE 0.5 MG PO TABS: 0.5000 mg | ORAL_TABLET | Freq: Every day | ORAL | Status: DC
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
## 2011-02-28 ENCOUNTER — Ambulatory Visit: Payer: Self-pay | Admitting: Vascular Surgery

## 2011-03-01 ENCOUNTER — Ambulatory Visit (HOSPITAL_COMMUNITY): Payer: No Typology Code available for payment source | Admitting: Psychiatry

## 2011-03-27 ENCOUNTER — Ambulatory Visit (INDEPENDENT_AMBULATORY_CARE_PROVIDER_SITE_OTHER): Payer: Medicare Other | Admitting: Psychiatry

## 2011-03-27 ENCOUNTER — Encounter (HOSPITAL_COMMUNITY): Payer: Self-pay | Admitting: Psychiatry

## 2011-03-27 VITALS — Wt 178.0 lb

## 2011-03-27 DIAGNOSIS — F200 Paranoid schizophrenia: Secondary | ICD-10-CM
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
## 2011-04-04 ENCOUNTER — Ambulatory Visit: Payer: Self-pay | Admitting: Vascular Surgery

## 2011-04-04 LAB — POTASSIUM: Potassium: 5 mmol/L (ref 3.5–5.1)

## 2011-04-18 ENCOUNTER — Ambulatory Visit: Payer: Self-pay | Admitting: Vascular Surgery

## 2011-04-18 LAB — BASIC METABOLIC PANEL
Calcium, Total: 10 mg/dL (ref 8.5–10.1)
Chloride: 105 mmol/L (ref 98–107)
Co2: 24 mmol/L (ref 21–32)
Creatinine: 7.17 mg/dL — ABNORMAL HIGH (ref 0.60–1.30)
EGFR (African American): 7 — ABNORMAL LOW
EGFR (Non-African Amer.): 6 — ABNORMAL LOW
Glucose: 96 mg/dL (ref 65–99)
Osmolality: 281 (ref 275–301)
Potassium: 5.4 mmol/L — ABNORMAL HIGH (ref 3.5–5.1)

## 2011-04-26 ENCOUNTER — Ambulatory Visit (INDEPENDENT_AMBULATORY_CARE_PROVIDER_SITE_OTHER): Payer: Medicare Other | Admitting: Psychiatry

## 2011-04-26 ENCOUNTER — Encounter (HOSPITAL_COMMUNITY): Payer: Self-pay | Admitting: Psychiatry

## 2011-04-26 DIAGNOSIS — F259 Schizoaffective disorder, unspecified: Secondary | ICD-10-CM

## 2011-04-26 MED ORDER — CITALOPRAM HYDROBROMIDE 20 MG PO TABS: 20.0000 mg | ORAL_TABLET | Freq: Every day | ORAL | Status: DC

## 2011-04-26 MED ORDER — ALPRAZOLAM 0.5 MG PO TABS: 0.5000 mg | ORAL_TABLET | Freq: Two times a day (BID) | ORAL | Status: DC

## 2011-04-26 MED ORDER — RISPERIDONE 0.5 MG PO TABS: 0.5000 mg | ORAL_TABLET | Freq: Every day | ORAL | Status: DC
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
## 2011-05-16 ENCOUNTER — Ambulatory Visit: Payer: Self-pay | Admitting: Vascular Surgery

## 2011-05-16 LAB — POTASSIUM: Potassium: 5.3 mmol/L — ABNORMAL HIGH (ref 3.5–5.1)

## 2011-05-25 ENCOUNTER — Ambulatory Visit: Payer: Self-pay | Admitting: Vascular Surgery

## 2011-05-25 LAB — BASIC METABOLIC PANEL
Anion Gap: 8 (ref 7–16)
BUN: 38 mg/dL — ABNORMAL HIGH (ref 7–18)
Calcium, Total: 9.5 mg/dL (ref 8.5–10.1)
Chloride: 99 mmol/L (ref 98–107)
Co2: 27 mmol/L (ref 21–32)
Creatinine: 8.12 mg/dL — ABNORMAL HIGH (ref 0.60–1.30)
Glucose: 71 mg/dL (ref 65–99)
Osmolality: 276 (ref 275–301)
Potassium: 5.5 mmol/L — ABNORMAL HIGH (ref 3.5–5.1)
Sodium: 134 mmol/L — ABNORMAL LOW (ref 136–145)

## 2011-05-25 LAB — CBC
HCT: 38.5 % (ref 35.0–47.0)
HGB: 12.2 g/dL (ref 12.0–16.0)
MCH: 27.4 pg (ref 26.0–34.0)
MCHC: 31.8 g/dL — ABNORMAL LOW (ref 32.0–36.0)
MCV: 86 fL (ref 80–100)
RBC: 4.46 10*6/uL (ref 3.80–5.20)
WBC: 8.6 10*3/uL (ref 3.6–11.0)

## 2011-06-01 ENCOUNTER — Ambulatory Visit: Payer: Self-pay | Admitting: Vascular Surgery

## 2011-06-01 LAB — POTASSIUM: Potassium: 5 mmol/L (ref 3.5–5.1)

## 2011-06-12 ENCOUNTER — Ambulatory Visit: Payer: Self-pay | Admitting: Vascular Surgery

## 2011-06-18 ENCOUNTER — Other Ambulatory Visit (HOSPITAL_COMMUNITY): Payer: Self-pay | Admitting: Oncology

## 2011-06-26 ENCOUNTER — Ambulatory Visit (INDEPENDENT_AMBULATORY_CARE_PROVIDER_SITE_OTHER): Payer: Medicare Other | Admitting: Psychiatry

## 2011-06-26 ENCOUNTER — Encounter (HOSPITAL_COMMUNITY): Payer: Self-pay | Admitting: Psychiatry

## 2011-06-26 DIAGNOSIS — F259 Schizoaffective disorder, unspecified: Secondary | ICD-10-CM

## 2011-06-26 DIAGNOSIS — F200 Paranoid schizophrenia: Secondary | ICD-10-CM

## 2011-06-26 MED ORDER — CITALOPRAM HYDROBROMIDE 20 MG PO TABS: 20.0000 mg | ORAL_TABLET | Freq: Every day | ORAL | Status: DC

## 2011-06-26 MED ORDER — RISPERIDONE 0.5 MG PO TABS: 0.5000 mg | ORAL_TABLET | Freq: Every day | ORAL | Status: DC

## 2011-06-26 MED ORDER — ALPRAZOLAM 0.5 MG PO TABS: 0.5000 mg | ORAL_TABLET | Freq: Two times a day (BID) | ORAL | Status: DC
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
## 2011-07-05 ENCOUNTER — Ambulatory Visit (HOSPITAL_COMMUNITY): Payer: Medicare Other

## 2011-08-15 ENCOUNTER — Other Ambulatory Visit (HOSPITAL_COMMUNITY): Payer: Self-pay | Admitting: Psychiatry

## 2011-08-15 DIAGNOSIS — F200 Paranoid schizophrenia: Secondary | ICD-10-CM

## 2011-08-28 ENCOUNTER — Encounter (HOSPITAL_COMMUNITY): Payer: Self-pay | Admitting: Psychiatry

## 2011-08-28 ENCOUNTER — Ambulatory Visit (INDEPENDENT_AMBULATORY_CARE_PROVIDER_SITE_OTHER): Payer: Self-pay | Admitting: Psychiatry

## 2011-08-28 DIAGNOSIS — F200 Paranoid schizophrenia: Secondary | ICD-10-CM

## 2011-08-28 DIAGNOSIS — F259 Schizoaffective disorder, unspecified: Secondary | ICD-10-CM

## 2011-08-28 MED ORDER — RISPERIDONE 0.5 MG PO TABS: 0.5000 mg | ORAL_TABLET | Freq: Every day | ORAL | Status: DC

## 2011-08-28 MED ORDER — ALPRAZOLAM 0.5 MG PO TABS: 0.5000 mg | ORAL_TABLET | Freq: Three times a day (TID) | ORAL | Status: DC | PRN
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
## 2011-09-20 ENCOUNTER — Telehealth (HOSPITAL_COMMUNITY): Payer: Self-pay | Admitting: *Deleted

## 2011-09-21 ENCOUNTER — Other Ambulatory Visit (HOSPITAL_COMMUNITY): Payer: Self-pay | Admitting: Psychiatry

## 2011-09-21 DIAGNOSIS — F200 Paranoid schizophrenia: Secondary | ICD-10-CM

## 2011-09-21 MED ORDER — ALPRAZOLAM 0.5 MG PO TABS: ORAL_TABLET | ORAL | Status: DC

## 2011-10-12 ENCOUNTER — Other Ambulatory Visit (HOSPITAL_COMMUNITY): Payer: Self-pay | Admitting: Psychiatry

## 2011-10-12 DIAGNOSIS — F200 Paranoid schizophrenia: Secondary | ICD-10-CM

## 2011-10-25 ENCOUNTER — Encounter (HOSPITAL_COMMUNITY): Payer: Self-pay | Admitting: Psychiatry

## 2011-10-25 ENCOUNTER — Ambulatory Visit (INDEPENDENT_AMBULATORY_CARE_PROVIDER_SITE_OTHER): Payer: Self-pay | Admitting: Psychiatry

## 2011-10-25 DIAGNOSIS — F200 Paranoid schizophrenia: Secondary | ICD-10-CM

## 2011-10-25 DIAGNOSIS — F259 Schizoaffective disorder, unspecified: Secondary | ICD-10-CM

## 2011-10-25 MED ORDER — ALPRAZOLAM 0.5 MG PO TABS: ORAL_TABLET | ORAL | Status: DC

## 2011-10-25 MED ORDER — CITALOPRAM HYDROBROMIDE 10 MG PO TABS: ORAL_TABLET | ORAL | Status: DC
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
## 2011-11-16 ENCOUNTER — Emergency Department (HOSPITAL_COMMUNITY)
Admission: EM | Admit: 2011-11-16 | Discharge: 2011-11-16 | Disposition: A | Discharge status: Home / Self Care | Payer: Medicare Other | Attending: Emergency Medicine | Admitting: Emergency Medicine

## 2011-11-16 ENCOUNTER — Encounter (HOSPITAL_COMMUNITY): Payer: Self-pay

## 2011-11-16 ENCOUNTER — Emergency Department (HOSPITAL_COMMUNITY): Payer: Medicare Other

## 2011-11-16 DIAGNOSIS — K219 Gastro-esophageal reflux disease without esophagitis: Secondary | ICD-10-CM | POA: Insufficient documentation

## 2011-11-16 DIAGNOSIS — N039 Chronic nephritic syndrome with unspecified morphologic changes: Secondary | ICD-10-CM | POA: Insufficient documentation

## 2011-11-16 DIAGNOSIS — I129 Hypertensive chronic kidney disease with stage 1 through stage 4 chronic kidney disease, or unspecified chronic kidney disease: Secondary | ICD-10-CM | POA: Insufficient documentation

## 2011-11-16 DIAGNOSIS — F259 Schizoaffective disorder, unspecified: Secondary | ICD-10-CM | POA: Insufficient documentation

## 2011-11-16 DIAGNOSIS — J9801 Acute bronchospasm: Secondary | ICD-10-CM | POA: Insufficient documentation

## 2011-11-16 DIAGNOSIS — R062 Wheezing: Secondary | ICD-10-CM | POA: Insufficient documentation

## 2011-11-16 DIAGNOSIS — J45909 Unspecified asthma, uncomplicated: Secondary | ICD-10-CM | POA: Insufficient documentation

## 2011-11-16 DIAGNOSIS — D631 Anemia in chronic kidney disease: Secondary | ICD-10-CM | POA: Insufficient documentation

## 2011-11-16 DIAGNOSIS — R079 Chest pain, unspecified: Secondary | ICD-10-CM | POA: Insufficient documentation

## 2011-11-16 DIAGNOSIS — Z888 Allergy status to other drugs, medicaments and biological substances status: Secondary | ICD-10-CM | POA: Insufficient documentation

## 2011-11-16 DIAGNOSIS — Z87891 Personal history of nicotine dependence: Secondary | ICD-10-CM | POA: Insufficient documentation

## 2011-11-16 DIAGNOSIS — E7800 Pure hypercholesterolemia, unspecified: Secondary | ICD-10-CM | POA: Insufficient documentation

## 2011-11-16 DIAGNOSIS — Z79899 Other long term (current) drug therapy: Secondary | ICD-10-CM | POA: Insufficient documentation

## 2011-11-16 LAB — COMPREHENSIVE METABOLIC PANEL
Albumin: 3.5 g/dL (ref 3.5–5.2)
BUN: 20 mg/dL (ref 6–23)
Creatinine, Ser: 5.76 mg/dL — ABNORMAL HIGH (ref 0.50–1.10)
GFR calc Af Amer: 8 mL/min — ABNORMAL LOW (ref >90)
Glucose, Bld: 98 mg/dL (ref 70–99)
Total Protein: 7.5 g/dL (ref 6.0–8.3)

## 2011-11-16 LAB — CBC WITH DIFFERENTIAL/PLATELET
Basophils Relative: 0 % (ref 0–1)
Eosinophils Absolute: 0.2 10*3/uL (ref 0.0–0.7)
Eosinophils Relative: 3 % (ref 0–5)
HCT: 34.4 % — ABNORMAL LOW (ref 36.0–46.0)
Hemoglobin: 10.6 g/dL — ABNORMAL LOW (ref 12.0–15.0)
MCH: 28.7 pg (ref 26.0–34.0)
MCHC: 30.8 g/dL (ref 30.0–36.0)
MCV: 93.2 fL (ref 78.0–100.0)
Monocytes Absolute: 0.4 10*3/uL (ref 0.1–1.0)
Monocytes Relative: 6 % (ref 3–12)

## 2011-11-16 LAB — PRO B NATRIURETIC PEPTIDE: Pro B Natriuretic peptide (BNP): 18269 pg/mL — ABNORMAL HIGH (ref 0–125)

## 2011-11-16 LAB — TROPONIN I: Troponin I: <0.3 ng/mL (ref <0.30)

## 2011-11-16 MED ORDER — ALBUTEROL SULFATE (5 MG/ML) 0.5% IN NEBU
5.0000 mg | INHALATION_SOLUTION | Freq: Once | RESPIRATORY_TRACT | Status: AC
  Administered 2011-11-16: 5 mg via RESPIRATORY_TRACT
  Filled 2011-11-16: qty 1

## 2011-11-16 MED ORDER — IPRATROPIUM BROMIDE 0.02 % IN SOLN
0.5000 mg | Freq: Once | RESPIRATORY_TRACT | Status: AC
  Administered 2011-11-16: 0.5 mg via RESPIRATORY_TRACT
  Filled 2011-11-16: qty 2.5

## 2011-11-16 MED ORDER — NITROGLYCERIN 2 % TD OINT
1.0000 [in_us] | TOPICAL_OINTMENT | Freq: Once | TRANSDERMAL | Status: AC
  Administered 2011-11-16: 1 [in_us] via TOPICAL
  Filled 2011-11-16: qty 1

## 2011-11-16 MED ORDER — IPRATROPIUM BROMIDE 0.02 % IN SOLN: 500.0000 ug | Freq: Four times a day (QID) | RESPIRATORY_TRACT | Status: DC

## 2011-11-16 NOTE — ED Provider Notes (Cosign Needed)
History   This chart was scribed for Ward Givens, MD by Gerlean Ren. This patient was seen in room APA18/APA18 and the patient's care was started at 08:15.   CSN: [id number]  Arrival date & time 11/16/11  0801   First MD Initiated Contact with Patient 11/16/11 [phone]      Chief Complaint  Patient presents with  . Shortness of Breath  . Chest Pain    (Consider location/radiation/quality/duration/timing/severity/associated sxs/prior treatment) The history is provided by the patient. No language interpreter was used.  Klaryssa Fauth Orton is a 71 y.o. female who presents to the Emergency Department complaining of 3 days of dyspnea on exertion improved by lying flat with associated wheezing and waxing-and-waning, non-radiating sharp chest pain that has no modifying factors except it briefly goes away with belching then comes back. . Pt denies cough and fever, however she has been having wheezing that is not improving with her nebulizer/inhaler the past couple of days.  Pt has been on dialysis since 01/2011.  Pt was at dry weight of 85kg in June but had been having severe arthritis pain after her dialysis.  Nephrologist changed dry weight to 90kg two weeks ago and again changed it to 91.5kg 3 days ago.  Pt had dialysis yesterday with no improvement to current symptoms.  Pt denies any lower extremity swelling, but reports abdomen feels bloated and her clothers are tight in the waistband.    Pt has h/o HTN, asthma, chronic kidney disease, and schizoaffective disorder.  Pt denies tobacco and alcohol use.   PCP is Dr. [name]. Nephrologist is Dr. [name].  Past Medical History  Diagnosis Date  . Hypertension   . Hypercholesterolemia   . Anemia   . GERD (gastroesophageal reflux disease)   . Asthma   . Osteoporosis   . Chronic kidney disease   . Anemia of chronic disease 08/07/2007  . Renal insufficiency 08/03/2010  . OSTEOPOROSIS 08/07/2007  . Peripheral neuropathy 08/03/2010  . Schizoaffective  disorder   . Arthritis   Bronchitis from smoking  Past Surgical History  Procedure Date  . Tubal ligation   . Breast biopsy   . Refractive surgery     right eye  . Bone marrow aspiration   . Bone marrow biopsy   . Insertion of dialysis catheter 01/23/2011    Procedure: INSERTION OF DIALYSIS CATHETER;  Surgeon: Chuck Hint, MD;  Location: [facility] OR;  Service: Vascular;  Laterality: N/A;  Insertion of Dialysis Catheter- Right Internal Jugular    Family History  Problem Relation Age of Onset  . Alcohol abuse Brother   . Bipolar disorder Brother     History  Substance Use Topics  . Smoking status: Former smoker  . Smokeless tobacco: Not on file  . Alcohol Use: No  Lives with spouse Lives at home  No OB history provided.  Review of Systems  Constitutional: Negative for fever.  Respiratory: Positive for shortness of breath and wheezing. Negative for cough.   Cardiovascular: Positive for chest pain.  All other systems reviewed and are negative.    Allergies  Ferrous gluconate  Home Medications   Current Outpatient Rx  Name Route Sig Dispense Refill  . PROAIR HFA IN Inhalation Inhale into the lungs as needed.      . ALPRAZOLAM 0.5 MG PO TABS  Take 1 tab twice daily and 3rd on dialysis days 75 tablet 1  . AMLODIPINE BESYLATE 10 MG PO TABS Oral Take 1 tablet (10 mg total) by mouth  daily. 30 tablet 12  . CALCITRIOL 0.5 MCG PO CAPS Oral Take 1 capsule (0.5 mcg total) by mouth daily. 30 capsule 12  . CITALOPRAM HYDROBROMIDE 10 MG PO TABS  Take 3 tab daily 30 tablet 1  . CLONIDINE HCL 0.1 MG PO TABS      . DONEPEZIL HCL 5 MG PO TABS      . GABAPENTIN 300 MG PO CAPS      . GABAPENTIN (PHN) PO Oral Take 300 mg by mouth at bedtime.      Marland Kitchen LABETALOL HCL 100 MG PO TABS Oral Take 100 mg by mouth 2 (two) times daily.      . OXYCODONE HCL 5 MG PO TABS      . RISPERIDONE 0.5 MG PO TABS  TAKE 1 TABLET BY MOUTH ONCE DAILY AT 8 PM. 30 tablet 1  . SIMVASTATIN 80 MG PO TABS  One  half daily 30 tablet 12  . TORSEMIDE 100 MG PO TABS  One half daily 30 tablet 12    BP 186/74  Pulse 78  Temp 97.8 F (36.6 C)  Resp 30  Ht 4\' 11"  (1.499 m)  Wt 204 lb (92.534 kg)  BMI 41.20 kg/m2  SpO2 95%  Vital signs normal except hypertension   Physical Exam  Nursing note and vitals reviewed. Constitutional: She is oriented to person, place, and time. She appears well-developed and well-nourished.  Non-toxic appearance. She does not appear ill. No distress.  HENT:  Head: Normocephalic and atraumatic.  Right Ear: External ear normal.  Left Ear: External ear normal.  Nose: Nose normal. No mucosal edema or rhinorrhea.  Mouth/Throat: Mucous membranes are normal. No dental abscesses or uvula swelling.       Tongue is dry.  Eyes: Conjunctivae normal and EOM are normal. Pupils are equal, round, and reactive to light.  Neck: Normal range of motion and full passive range of motion without pain. Neck supple.       Pt has soft swelling in both supraclavicular areas  Cardiovascular: Normal rate, regular rhythm and normal heart sounds.  Exam reveals no gallop and no friction rub.   No murmur heard. Pulmonary/Chest: Accessory muscle usage present. Tachypnea noted. She is in respiratory distress. She has wheezes. She has no rhonchi. She has no rales. She exhibits no tenderness and no crepitus.       Diffuse prolonged expiratory wheezing.  Increased effort of breathing.  Appears tachypneic.    Abdominal: Soft. Normal appearance and bowel sounds are normal. She exhibits distension. She exhibits no mass. There is no tenderness. There is no rebound and no guarding.  Musculoskeletal: Normal range of motion. She exhibits no edema and no tenderness.       Moves all extremities well.  No lower extremity edema.  Neurological: She is alert and oriented to person, place, and time. She has normal strength. No cranial nerve deficit.  Skin: Skin is warm, dry and intact. No rash noted. No erythema. No  pallor.  Psychiatric: She has a normal mood and affect. Her speech is normal and behavior is normal. Her mood appears not anxious.    ED Course  Procedures (including critical care time)   Medications  ipratropium (ATROVENT) 0.02 % nebulizer solution (not administered)  nitroGLYCERIN (NITROGLYN) 2 % ointment 1 inch (1 inch Topical Given 11/16/11 0854)  albuterol (PROVENTIL) (5 MG/ML) 0.5% nebulizer solution 5 mg (5 mg Nebulization Given 11/16/11 0852)  ipratropium (ATROVENT) nebulizer solution 0.5 mg (0.5 mg Nebulization Given 11/16/11  0981)  albuterol (PROVENTIL) (5 MG/ML) 0.5% nebulizer solution 5 mg (5 mg Nebulization Given 11/16/11 1108)  ipratropium (ATROVENT) nebulizer solution 0.5 mg (0.5 mg Nebulization Given 11/16/11 1108)     DIAGNOSTIC STUDIES: Oxygen Saturation is 95% on room air, adequate by my interpretation.    COORDINATION OF CARE: 08:24- Patient and family informed of clinical course, understand medical decision-making process, and agree with plan.  Ordered nitroglycerin, breathing treatment, CBC, c-met, pro b natriuretic peptide, troponin, and chest XR.  10:40 feeling better, laying flat in bed. Has improved air movement and has lower pitched scattered wheezing after nebulizer.   12:20 very comfortable, no labored breathing. No more wheezing. Smiling and feeling better. Have talked to daughter about checking her pledgets for her nebulizer to make sure she is getting 5 mg and they are also interested in having the atrovent added to her regimine.   Results for orders placed during the hospital encounter of 11/16/11  CBC WITH DIFFERENTIAL      Component Value Range   WBC 6.4  4.0 - 10.5 K/uL   RBC 3.69 (*) 3.87 - 5.11 MIL/uL   Hemoglobin 10.6 (*) 12.0 - 15.0 g/dL   HCT 19.1 (*) 47.8 - 29.5 %   MCV 93.2  78.0 - 100.0 fL   MCH 28.7  26.0 - 34.0 pg   MCHC 30.8  30.0 - 36.0 g/dL   RDW 62.1 (*) 30.8 - 65.7 %   Platelets 201  150 - 400 K/uL   Neutrophils Relative 64  43 -  77 %   Neutro Abs 4.1  1.7 - 7.7 K/uL   Lymphocytes Relative 26  12 - 46 %   Lymphs Abs 1.7  0.7 - 4.0 K/uL   Monocytes Relative 6  3 - 12 %   Monocytes Absolute 0.4  0.1 - 1.0 K/uL   Eosinophils Relative 3  0 - 5 %   Eosinophils Absolute 0.2  0.0 - 0.7 K/uL   Basophils Relative 0  0 - 1 %   Basophils Absolute 0.0  0.0 - 0.1 K/uL  COMPREHENSIVE METABOLIC PANEL      Component Value Range   Sodium 141  135 - 145 mEq/L   Potassium 4.4  3.5 - 5.1 mEq/L   Chloride 105  96 - 112 mEq/L   CO2 27  19 - 32 mEq/L   Glucose, Bld 98  70 - 99 mg/dL   BUN 20  6 - 23 mg/dL   Creatinine, Ser 8.46 (*) 0.50 - 1.10 mg/dL   Calcium 9.5  8.4 - 96.2 mg/dL   Total Protein 7.5  6.0 - 8.3 g/dL   Albumin 3.5  3.5 - 5.2 g/dL   AST 18  0 - 37 U/L   ALT 12  0 - 35 U/L   Alkaline Phosphatase 102  39 - 117 U/L   Total Bilirubin 0.4  0.3 - 1.2 mg/dL   GFR calc non Af Amer 7 (*) >90 mL/min   GFR calc Af Amer 8 (*) >90 mL/min  PRO B NATRIURETIC PEPTIDE      Component Value Range   Pro B Natriuretic peptide (BNP) 18269.0 (*) 0 - 125 pg/mL  TROPONIN I      Component Value Range   Troponin I <0.30  <0.30 ng/mL   Laboratory interpretation all normal except stable anemia, renal failure, elevated BNP (probally from her ESRD)    Dg Chest Port 1 View  11/16/2011  *RADIOLOGY REPORT*  Clinical Data: 71 year old  female with shortness of breath chest pain.  PORTABLE CHEST - 1 VIEW  Comparison: 01/23/2011 and earlier.  Findings: AP portable upright view 0858 hours. Stable cardiomegaly and mediastinal contours.  Chronic low lung volumes.  No overt pulmonary edema.  No pneumothorax.  No large pleural effusion. Chronic left perihilar streaky opacity most resembles atelectasis or scarring.  Chronic streaky opacity at the right lung bases clear.  IMPRESSION: Chronic cardiomegaly and low lung volumes.  No definite acute cardiopulmonary abnormality.   Original Report Authenticated By: Erskine Speed, M.D.       Date: 11/16/2011   Rate: 80  Rhythm: normal sinus rhythm  QRS Axis: normal  Intervals: normal  ST/T Wave abnormalities: normal  Conduction Disutrbances:left anterior fascicular block  Narrative Interpretation: PRWP  Old EKG Reviewed: NSCS 01/16/2011    1. Bronchospasm    New Prescriptions   IPRATROPIUM (ATROVENT) 0.02 % NEBULIZER SOLUTION    Take 2.5 mLs (500 mcg total) by nebulization 4 (four) times daily.    Plan discharge  Devoria Albe, MD, FACEP    MDM   I personally performed the services described in this documentation, which was scribed in my presence. The recorded information has been reviewed and considered.  Devoria Albe, MD, Armando Gang         Ward Givens, MD 11/16/11 1226

## 2011-11-16 NOTE — ED Notes (Signed)
Pt c/o SOB for 3 days also has complaints of chest pressure that goes into back. Pt had dialysis the previous day and did run the full 4 hrs.

## 2011-12-04 ENCOUNTER — Encounter (HOSPITAL_COMMUNITY): Payer: Self-pay | Admitting: Pharmacy Technician

## 2011-12-11 ENCOUNTER — Ambulatory Visit (HOSPITAL_COMMUNITY)
Admission: RE | Admit: 2011-12-11 | Discharge: 2011-12-11 | Disposition: A | Discharge status: Home / Self Care | Payer: Medicare Other | Source: Ambulatory Visit | Attending: Ophthalmology | Admitting: Ophthalmology

## 2011-12-11 ENCOUNTER — Encounter (HOSPITAL_COMMUNITY)
Admission: RE | Disposition: A | Discharge status: Home / Self Care | Payer: Medicare Other | Source: Ambulatory Visit | Attending: Ophthalmology

## 2011-12-11 DIAGNOSIS — H02839 Dermatochalasis of unspecified eye, unspecified eyelid: Secondary | ICD-10-CM | POA: Insufficient documentation

## 2011-12-11 DIAGNOSIS — H269 Unspecified cataract: Secondary | ICD-10-CM | POA: Insufficient documentation

## 2011-12-11 HISTORY — PX: YAG LASER APPLICATION: SHX6189

## 2011-12-11 SURGERY — TREATMENT, USING YAG LASER
Anesthesia: LOCAL | Laterality: Left

## 2011-12-11 MED ORDER — TROPICAMIDE 1 % OP SOLN
1.0000 [drp] | OPHTHALMIC | Status: AC
  Administered 2011-12-11 (×2): 1 [drp] via OPHTHALMIC

## 2011-12-11 NOTE — H&P (Signed)
The patient was re examined and there is no change in the patients condition since the original H and P. 

## 2011-12-11 NOTE — Brief Op Note (Signed)
[name] 12/11/2011  Merrit Friesen T. Nile Riggs, MD  Yag Laser Self Test Completedyes. Procedure: Posterior Capsulotomy, left eye.  Eye Protection Worn by Staff yes. Laser In Use Sign on Door yes.  Laser: Nd:YAG Spot Size: Fixed Burst Mode: III Power Setting: 3.5 mJ/burst  Number of shots: 12 Total energy delivered: 41.9 mJ  Patency of the peripheral iridotomy was confirmed visually.  The patient tolerated the procedure without difficulty. No complications were encountered.    The patient was discharged home with the instructions to continue all her current glaucoma medications, if any.   Patient instructed to go to office at 0100 for intraocular pressure check.  Patient verbalizes understanding of discharge instructions yes.

## 2011-12-12 ENCOUNTER — Encounter (HOSPITAL_COMMUNITY): Payer: Self-pay | Admitting: Ophthalmology

## 2011-12-14 ENCOUNTER — Other Ambulatory Visit (HOSPITAL_COMMUNITY): Payer: Self-pay | Admitting: Psychiatry

## 2011-12-17 ENCOUNTER — Other Ambulatory Visit (HOSPITAL_COMMUNITY): Payer: Self-pay | Admitting: Psychiatry

## 2011-12-24 ENCOUNTER — Ambulatory Visit (INDEPENDENT_AMBULATORY_CARE_PROVIDER_SITE_OTHER): Payer: Medicare Other | Admitting: Psychiatry

## 2011-12-24 ENCOUNTER — Encounter (HOSPITAL_COMMUNITY): Payer: Self-pay | Admitting: Psychiatry

## 2011-12-24 VITALS — Ht 59.75 in | Wt 200.2 lb

## 2011-12-24 DIAGNOSIS — F341 Dysthymic disorder: Secondary | ICD-10-CM

## 2011-12-24 DIAGNOSIS — F329 Major depressive disorder, single episode, unspecified: Secondary | ICD-10-CM

## 2011-12-24 DIAGNOSIS — R52 Pain, unspecified: Secondary | ICD-10-CM

## 2011-12-24 DIAGNOSIS — F259 Schizoaffective disorder, unspecified: Secondary | ICD-10-CM

## 2011-12-24 MED ORDER — GABAPENTIN 100 MG PO CAPS: 100.0000 mg | ORAL_CAPSULE | Freq: Three times a day (TID) | ORAL | Status: DC

## 2011-12-24 MED ORDER — RISPERIDONE 0.5 MG PO TABS: 0.5000 mg | ORAL_TABLET | Freq: Every day | ORAL | Status: DC

## 2011-12-24 MED ORDER — LIDOCAINE 5 % EX PTCH: 1.0000 | MEDICATED_PATCH | Freq: Two times a day (BID) | CUTANEOUS | Status: DC

## 2011-12-24 MED ORDER — DULOXETINE HCL 30 MG PO CPEP: 30.0000 mg | ORAL_CAPSULE | Freq: Every day | ORAL | Status: DC

## 2011-12-24 NOTE — Patient Instructions (Addendum)
Could use "Move Free" or "Osteo bi Flex" for arthritic pain.   The important ingredients are Chondrotin Sulfate and Glucosamine.  We will switch the Celexa to Cymbalta for the pain management.  We will try Lidoderm for the back pain management.   Stop the Xanax and use 1 or 2 of the 100 mg Neurontin in place of that.   Ask your nephrologist or maybe family doctor about the permissibility of using Mobic.   Call if problems.
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
## 2012-01-03 ENCOUNTER — Telehealth (HOSPITAL_COMMUNITY): Payer: Self-pay | Admitting: Psychiatry

## 2012-01-03 NOTE — Telephone Encounter (Signed)
Phone message completed in the phone message section.  

## 2012-01-07 ENCOUNTER — Telehealth (HOSPITAL_COMMUNITY): Payer: Self-pay | Admitting: Psychiatry

## 2012-01-07 MED ORDER — ALPRAZOLAM 0.5 MG PO TABS: 0.5000 mg | ORAL_TABLET | Freq: Three times a day (TID) | ORAL | Status: DC | PRN

## 2012-01-07 NOTE — Telephone Encounter (Signed)
S/W pt and daughter.  She was hallucinating on the Neurontin and stopped it.  She has needed something for anxiety.  Will restart Xanax. So ordered.

## 2012-01-21 ENCOUNTER — Other Ambulatory Visit (HOSPITAL_COMMUNITY): Payer: Self-pay | Admitting: Psychiatry

## 2012-01-21 NOTE — Telephone Encounter (Signed)
Surescripts refill request approved.

## 2012-01-28 ENCOUNTER — Encounter (HOSPITAL_COMMUNITY): Payer: Self-pay | Admitting: Psychiatry

## 2012-01-28 ENCOUNTER — Ambulatory Visit (INDEPENDENT_AMBULATORY_CARE_PROVIDER_SITE_OTHER): Payer: Medicare Other | Admitting: Psychiatry

## 2012-01-28 VITALS — Wt 201.0 lb

## 2012-01-28 DIAGNOSIS — F3289 Other specified depressive episodes: Secondary | ICD-10-CM

## 2012-01-28 DIAGNOSIS — R52 Pain, unspecified: Secondary | ICD-10-CM

## 2012-01-28 DIAGNOSIS — F411 Generalized anxiety disorder: Secondary | ICD-10-CM

## 2012-01-28 DIAGNOSIS — F329 Major depressive disorder, single episode, unspecified: Secondary | ICD-10-CM

## 2012-01-28 DIAGNOSIS — F259 Schizoaffective disorder, unspecified: Secondary | ICD-10-CM

## 2012-01-28 MED ORDER — LIDOCAINE 5 % EX PTCH: 1.0000 | MEDICATED_PATCH | Freq: Two times a day (BID) | CUTANEOUS | Status: DC

## 2012-01-28 MED ORDER — CITALOPRAM HYDROBROMIDE 20 MG PO TABS: 20.0000 mg | ORAL_TABLET | Freq: Every day | ORAL | Status: DC

## 2012-01-28 MED ORDER — GABAPENTIN 300 MG PO CAPS: 300.0000 mg | ORAL_CAPSULE | Freq: Three times a day (TID) | ORAL | Status: DC

## 2012-01-28 MED ORDER — ALPRAZOLAM 0.5 MG PO TABS: 0.5000 mg | ORAL_TABLET | Freq: Three times a day (TID) | ORAL | Status: DC | PRN

## 2012-01-28 MED ORDER — ACETAMINOPHEN 500 MG PO TABS: 1000.0000 mg | ORAL_TABLET | Freq: Four times a day (QID) | ORAL | Status: AC | PRN

## 2012-01-28 MED ORDER — RISPERIDONE 0.5 MG PO TABS: 0.5000 mg | ORAL_TABLET | Freq: Every day | ORAL | Status: DC
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
## 2012-01-28 NOTE — Patient Instructions (Addendum)
Try using jogging in a jug and add tart cherry juice and mangosteen juice for the arthritis.  Could use "Move Free" or "Osteo bi Flex" for arthritic pain.   The important ingredients are Chondrotin Sulfate and Glucosamine.  Tumeric is also helpful for arthritis.   Krill oil and cod liver oil may be helpful for arthritis.   [employer] is a great source for all of these.  [phone]  SAMe 200mg  at night might help with getting to sleep or pushing to 400mg  at night might help with getting to sleep.  It also may help with managing stress, anxiety, depression or joint pain.  [employer] is the best brand.    [employer] has their own brand that works too.  Elavil at night might be helpful for anxiety, depression and pain management along with sedation.   Try using Naprosyn 440 mg first thing in the AM, Tylenol 1000 mg at noon, another Naprosyn at supper and then another Tylenol 1000 mg at bed time.  Yoga is a very helpful exercise method.  On TV or on line [employer] is a source of high quality information about yoga and videos on yoga.  Renee Ramus is the world's number one video yoga instructor according to some experts.  There are exceptional health benefits that can be achieved through yoga.  The main principles of yoga is acceptance, no competition, no comparison, and no judgement.  It is exceptional in helping people meditate and get to a very relaxed state.   Moving helps reduce pain, depression, and anxiety.

## 2012-03-03 ENCOUNTER — Telehealth (HOSPITAL_COMMUNITY): Payer: Self-pay | Admitting: Psychiatry

## 2012-03-03 DIAGNOSIS — F419 Anxiety disorder, unspecified: Secondary | ICD-10-CM

## 2012-03-03 MED ORDER — ALPRAZOLAM 0.5 MG PO TABS: 0.5000 mg | ORAL_TABLET | Freq: Three times a day (TID) | ORAL | Status: DC | PRN

## 2012-03-03 NOTE — Telephone Encounter (Signed)
Completed in prior telephone message encounter.

## 2012-03-03 NOTE — Telephone Encounter (Signed)
Refill request approved via eScripts.  

## 2012-03-11 ENCOUNTER — Encounter (HOSPITAL_COMMUNITY): Payer: Self-pay | Admitting: Psychiatry

## 2012-03-11 ENCOUNTER — Ambulatory Visit (INDEPENDENT_AMBULATORY_CARE_PROVIDER_SITE_OTHER): Payer: Medicare Other | Admitting: Psychiatry

## 2012-03-11 VITALS — Wt 204.0 lb

## 2012-03-11 DIAGNOSIS — F259 Schizoaffective disorder, unspecified: Secondary | ICD-10-CM

## 2012-03-11 DIAGNOSIS — Z79899 Other long term (current) drug therapy: Secondary | ICD-10-CM

## 2012-03-11 DIAGNOSIS — F3289 Other specified depressive episodes: Secondary | ICD-10-CM

## 2012-03-11 MED ORDER — GABAPENTIN 300 MG PO CAPS: 300.0000 mg | ORAL_CAPSULE | Freq: Four times a day (QID) | ORAL | Status: DC

## 2012-03-11 MED ORDER — LIDOCAINE 5 % EX PTCH: 1.0000 | MEDICATED_PATCH | Freq: Two times a day (BID) | CUTANEOUS | Status: DC

## 2012-03-11 MED ORDER — METHOCARBAMOL 500 MG PO TABS: 500.0000 mg | ORAL_TABLET | Freq: Four times a day (QID) | ORAL | Status: DC

## 2012-03-11 MED ORDER — RISPERIDONE 0.5 MG PO TABS: 0.5000 mg | ORAL_TABLET | Freq: Every day | ORAL | Status: DC

## 2012-03-11 MED ORDER — ALPRAZOLAM 0.5 MG PO TABS: 0.5000 mg | ORAL_TABLET | Freq: Three times a day (TID) | ORAL | Status: DC | PRN
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
## 2012-03-11 NOTE — Patient Instructions (Signed)
Shift from the Xanax to the NEURONTIN.  It is safer for you.  CUT BACK/CUT OUT on sugar and carbohydrates, that means very limited fruits and starchy vegetables and very limited grains, breads  The goal is low GLYCEMIC INDEX.  CUT OUT all wheat, rye, or barley for the GLUTEN in them.  HIGH fat and LOW carbohydrate diet is the KEY.  Eat avocados, eggs, lean meat like grass fed beef and chicken  Nuts and seeds would be good foods as well.   Stevia is an excellent sweetener.  Safe for the brain.   Lowella Grip is also a good safe sweetener.  Almond butter is awesome.  Check out all this on the Internet.  Try the Robaxin for muscle spasms  Call if problems or concerns.

## 2012-03-13 ENCOUNTER — Other Ambulatory Visit (HOSPITAL_COMMUNITY): Payer: Self-pay | Admitting: Psychiatry

## 2012-03-25 ENCOUNTER — Telehealth (HOSPITAL_COMMUNITY): Payer: Self-pay | Admitting: Psychiatry

## 2012-03-25 MED ORDER — GABAPENTIN 300 MG PO CAPS: 600.0000 mg | ORAL_CAPSULE | Freq: Four times a day (QID) | ORAL | Status: DC

## 2012-03-25 MED ORDER — RISPERIDONE 0.5 MG PO TABS: 0.5000 mg | ORAL_TABLET | Freq: Two times a day (BID) | ORAL | Status: DC

## 2012-03-25 NOTE — Telephone Encounter (Signed)
S/W pt and her daughter by phone.  It seems that she is getting paranoid with the transition to lower Xanax dosages.  Will increase the Neurontin to 600 mg QID and increase the Risperdal to BID.  Scripts for this sent and will see her on the 25th.

## 2012-03-28 ENCOUNTER — Other Ambulatory Visit (HOSPITAL_COMMUNITY): Payer: Self-pay | Admitting: Oncology

## 2012-04-04 ENCOUNTER — Emergency Department (HOSPITAL_COMMUNITY)
Admission: EM | Admit: 2012-04-04 | Discharge: 2012-04-04 | Disposition: A | Discharge status: Home / Self Care | Payer: Medicare Other | Attending: Emergency Medicine | Admitting: Emergency Medicine

## 2012-04-04 ENCOUNTER — Encounter (HOSPITAL_COMMUNITY): Payer: Self-pay | Admitting: *Deleted

## 2012-04-04 ENCOUNTER — Emergency Department (HOSPITAL_COMMUNITY): Payer: Medicare Other

## 2012-04-04 DIAGNOSIS — J449 Chronic obstructive pulmonary disease, unspecified: Secondary | ICD-10-CM

## 2012-04-04 DIAGNOSIS — I120 Hypertensive chronic kidney disease with stage 5 chronic kidney disease or end stage renal disease: Secondary | ICD-10-CM | POA: Insufficient documentation

## 2012-04-04 DIAGNOSIS — J441 Chronic obstructive pulmonary disease with (acute) exacerbation: Secondary | ICD-10-CM | POA: Insufficient documentation

## 2012-04-04 DIAGNOSIS — Z8669 Personal history of other diseases of the nervous system and sense organs: Secondary | ICD-10-CM | POA: Insufficient documentation

## 2012-04-04 DIAGNOSIS — M810 Age-related osteoporosis without current pathological fracture: Secondary | ICD-10-CM | POA: Insufficient documentation

## 2012-04-04 DIAGNOSIS — F259 Schizoaffective disorder, unspecified: Secondary | ICD-10-CM | POA: Insufficient documentation

## 2012-04-04 DIAGNOSIS — E7800 Pure hypercholesterolemia, unspecified: Secondary | ICD-10-CM | POA: Insufficient documentation

## 2012-04-04 DIAGNOSIS — M129 Arthropathy, unspecified: Secondary | ICD-10-CM | POA: Insufficient documentation

## 2012-04-04 DIAGNOSIS — D649 Anemia, unspecified: Secondary | ICD-10-CM

## 2012-04-04 DIAGNOSIS — Z992 Dependence on renal dialysis: Secondary | ICD-10-CM | POA: Insufficient documentation

## 2012-04-04 DIAGNOSIS — J189 Pneumonia, unspecified organism: Secondary | ICD-10-CM | POA: Insufficient documentation

## 2012-04-04 DIAGNOSIS — D631 Anemia in chronic kidney disease: Secondary | ICD-10-CM | POA: Insufficient documentation

## 2012-04-04 DIAGNOSIS — N186 End stage renal disease: Secondary | ICD-10-CM

## 2012-04-04 DIAGNOSIS — Z79899 Other long term (current) drug therapy: Secondary | ICD-10-CM | POA: Insufficient documentation

## 2012-04-04 DIAGNOSIS — Z8719 Personal history of other diseases of the digestive system: Secondary | ICD-10-CM | POA: Insufficient documentation

## 2012-04-04 DIAGNOSIS — Z87891 Personal history of nicotine dependence: Secondary | ICD-10-CM | POA: Insufficient documentation

## 2012-04-04 LAB — BASIC METABOLIC PANEL
CO2: 27 mEq/L (ref 19–32)
Calcium: 9 mg/dL (ref 8.4–10.5)
Chloride: 98 mEq/L (ref 96–112)
Glucose, Bld: 104 mg/dL — ABNORMAL HIGH (ref 70–99)
Potassium: 4.2 mEq/L (ref 3.5–5.1)
Sodium: 138 mEq/L (ref 135–145)

## 2012-04-04 LAB — CBC WITH DIFFERENTIAL/PLATELET
Eosinophils Relative: 2 % (ref 0–5)
Lymphocytes Relative: 13 % (ref 12–46)
Lymphs Abs: 1.1 10*3/uL (ref 0.7–4.0)
MCV: 93.4 fL (ref 78.0–100.0)
Neutro Abs: 6.5 10*3/uL (ref 1.7–7.7)
Platelets: 209 10*3/uL (ref 150–400)
RBC: 3.05 MIL/uL — ABNORMAL LOW (ref 3.87–5.11)
WBC: 8.2 10*3/uL (ref 4.0–10.5)

## 2012-04-04 MED ORDER — SODIUM CHLORIDE 0.9 % IV SOLN
Freq: Once | INTRAVENOUS | Status: AC
  Administered 2012-04-04: 500 mL via INTRAVENOUS

## 2012-04-04 MED ORDER — CEFTRIAXONE SODIUM 1 G IJ SOLR: 1.0000 g | Freq: Once | INTRAMUSCULAR | Status: DC

## 2012-04-04 MED ORDER — DEXTROSE 5 % IV SOLN
1.0000 g | Freq: Once | INTRAVENOUS | Status: AC
  Administered 2012-04-04: 1 g via INTRAVENOUS
  Filled 2012-04-04: qty 10

## 2012-04-04 MED ORDER — AZITHROMYCIN 250 MG PO TABS: ORAL_TABLET | ORAL | Status: DC

## 2012-04-04 MED ORDER — AZITHROMYCIN 250 MG PO TABS
500.0000 mg | ORAL_TABLET | Freq: Once | ORAL | Status: AC
  Administered 2012-04-04: 500 mg via ORAL
  Filled 2012-04-04: qty 2

## 2012-04-04 NOTE — ED Provider Notes (Signed)
History     CSN: [id number]  Arrival date & time 04/04/12  0128   First MD Initiated Contact with Patient 04/04/12 [phone]      Chief Complaint  Patient presents with  . Shortness of Breath    (Consider location/radiation/quality/duration/timing/severity/associated sxs/prior treatment) HPI [name] is a 72 y.o. female with a h/o HTN, GERD, ESRD on dialysis M-W-F who presents to the Emergency Department complaining of shortness of breath. Came from home with shortness of breath that got worse through the course of today. Now with audible wheezing.  She states she dod not finish her dialysis on Wednesday.  PCP  Dr. [name] Past Medical History  Diagnosis Date  . Hypertension   . Hypercholesterolemia   . Anemia   . GERD (gastroesophageal reflux disease)   . Asthma   . Osteoporosis   . Chronic kidney disease   . Anemia of chronic disease 08/07/2007  . Renal insufficiency 08/03/2010  . OSTEOPOROSIS 08/07/2007  . Peripheral neuropathy 08/03/2010  . Schizoaffective disorder   . Arthritis     Past Surgical History  Procedure Laterality Date  . Tubal ligation    . Breast biopsy    . Refractive surgery      right eye  . Bone marrow aspiration    . Bone marrow biopsy    . Insertion of dialysis catheter  01/23/2011    Procedure: INSERTION OF DIALYSIS CATHETER;  Surgeon: Chuck Hint, MD;  Location: [facility] OR;  Service: Vascular;  Laterality: N/A;  Insertion of Dialysis Catheter- Right Internal Jugular  . Yag laser application  12/11/2011    Procedure: YAG LASER APPLICATION;  Surgeon: Loraine Leriche T. Nile Riggs, MD;  Location: [facility];  Service: Ophthalmology;  Laterality: Left;    Family History  Problem Relation Age of Onset  . Alcohol abuse Brother   . Bipolar disorder Brother   . Dementia Mother   . Depression Mother   . ADD / ADHD Neg Hx   . Anxiety disorder Neg Hx   . Drug abuse Neg Hx   . OCD Neg Hx   . Paranoid behavior Neg Hx   . Schizophrenia Neg Hx   . Seizures Neg Hx    . Physical abuse Neg Hx   . Sexual abuse Neg Hx     History  Substance Use Topics  . Smoking status: Former Games developer  . Smokeless tobacco: Not on file  . Alcohol Use: No    OB History   Grav Para Term Preterm Abortions TAB SAB Ect Mult Living                  Review of Systems  Constitutional: Negative for fever.       10 Systems reviewed and are negative for acute change except as noted in the HPI.  HENT: Negative for congestion.   Eyes: Negative for discharge and redness.  Respiratory: Positive for shortness of breath and wheezing. Negative for cough.   Cardiovascular: Negative for chest pain.  Gastrointestinal: Negative for vomiting and abdominal pain.  Musculoskeletal: Negative for back pain.  Skin: Negative for rash.  Neurological: Negative for syncope, numbness and headaches.  Psychiatric/Behavioral:       No behavior change.    Allergies  Ativan; Cymbalta; and Ferrous gluconate  Home Medications   Current Outpatient Rx  Name  Route  Sig  Dispense  Refill  . ALPRAZolam (XANAX) 0.5 MG tablet   Oral   Take 1 tablet (0.5 mg total) by mouth  3 (three) times daily as needed for anxiety (insomnia). TRY to cut back and use Neurontin instead.  Prudy Feeler has the same effect on the brain as alcohol.   60 tablet   0   . gabapentin (NEURONTIN) 300 MG capsule   Oral   Take 2 capsules (600 mg total) by mouth 4 (four) times daily. Shift off the Xanax on to this.  See you on the 25th   240 capsule   0   . labetalol (NORMODYNE) 100 MG tablet   Oral   Take 100 mg by mouth daily.          . methocarbamol (ROBAXIN) 500 MG tablet   Oral   Take 1 tablet (500 mg total) by mouth 4 (four) times daily.   120 tablet   0   . oxyCODONE (OXY IR/ROXICODONE) 5 MG immediate release tablet   Oral   Take 5 mg by mouth 3 (three) times daily as needed. pain         . acetaminophen (TYLENOL) 500 MG tablet   Oral   Take 2 tablets (1,000 mg total) by mouth every 6 (six) hours as  needed for pain.   120 tablet   2   . EXPIRED: amLODipine (NORVASC) 10 MG tablet   Oral   Take 1 tablet (10 mg total) by mouth daily.   30 tablet   12   . b complex-vitamin c-folic acid (NEPHRO-VITE) 0.8 MG TABS   Oral   Take 0.8 mg by mouth daily.         . citalopram (CELEXA) 20 MG tablet      TAKE (1) TABLET BY MOUTH ONCE DAILY.   30 tablet   1   . donepezil (ARICEPT) 5 MG tablet   Oral   Take 5 mg by mouth at bedtime.          Marland Kitchen ipratropium (ATROVENT) 0.02 % nebulizer solution   Nebulization   Take 500 mcg by nebulization 4 (four) times daily as needed. Shortness of Breath         . lidocaine (LIDODERM) 5 %   Transdermal   Place 1 patch onto the skin every 12 (twelve) hours. Remove & Discard patch within 12 hours or as directed by MD, use TAPE on all four sides.   30 patch   2   . risperiDONE (RISPERDAL) 0.5 MG tablet   Oral   Take 1 tablet (0.5 mg total) by mouth 2 (two) times daily.   60 tablet   2     Please deliver   . simvastatin (ZOCOR) 20 MG tablet   Oral   Take 20 mg by mouth every evening.         . torsemide (DEMADEX) 100 MG tablet   Oral   Take 50 mg by mouth daily.            BP 161/86  Pulse 57  Temp(Src) 97.7 F (36.5 C) (Oral)  Ht 4\' 11"  (1.499 m)  Wt 195 lb (88.451 kg)  BMI 39.36 kg/m2  SpO2 98%  Physical Exam  Nursing note and vitals reviewed. Constitutional: She appears well-developed and well-nourished.  Awake, alert, nontoxic appearance.  HENT:  Head: Normocephalic and atraumatic.  Right Ear: External ear normal.  Left Ear: External ear normal.  Mouth/Throat: Oropharynx is clear and moist.  Eyes: EOM are normal. Pupils are equal, round, and reactive to light. Right eye exhibits no discharge. Left eye exhibits no discharge.  Neck: Normal range  of motion. Neck supple.  Cardiovascular: Normal rate and intact distal pulses.   Pulmonary/Chest: Effort normal. She has wheezes. She exhibits no tenderness.  Abdominal:  Soft. Bowel sounds are normal. There is no tenderness. There is no rebound.  Musculoskeletal: She exhibits no tenderness.  Baseline ROM, no obvious new focal weakness.  Neurological:  Mental status and motor strength appears baseline for patient and situation.  Skin: No rash noted.  AVG with good bruit and thrill.  Psychiatric: She has a normal mood and affect.    ED Course  Procedures (including critical care time) Results for orders placed during the hospital encounter of 04/04/12  CBC WITH DIFFERENTIAL      Result Value Range   WBC 8.2  4.0 - 10.5 K/uL   RBC 3.05 (*) 3.87 - 5.11 MIL/uL   Hemoglobin 9.1 (*) 12.0 - 15.0 g/dL   HCT 16.1 (*) 09.6 - 04.5 %   MCV 93.4  78.0 - 100.0 fL   MCH 29.8  26.0 - 34.0 pg   MCHC 31.9  30.0 - 36.0 g/dL   RDW 40.9 (*) 81.1 - 91.4 %   Platelets 209  150 - 400 K/uL   Neutrophils Relative 79 (*) 43 - 77 %   Neutro Abs 6.5  1.7 - 7.7 K/uL   Lymphocytes Relative 13  12 - 46 %   Lymphs Abs 1.1  0.7 - 4.0 K/uL   Monocytes Relative 6  3 - 12 %   Monocytes Absolute 0.5  0.1 - 1.0 K/uL   Eosinophils Relative 2  0 - 5 %   Eosinophils Absolute 0.1  0.0 - 0.7 K/uL   Basophils Relative 0  0 - 1 %   Basophils Absolute 0.0  0.0 - 0.1 K/uL    Dg Chest 2 View  04/04/2012  *RADIOLOGY REPORT*  Clinical Data: Shortness of breath.  Former smoker.  CHEST - 2 VIEW  Comparison: 11/16/2011  Findings: Shallow inspiration.  Diffuse cardiac enlargement with normal pulmonary vascularity.  There is increased density in the lung bases bilaterally.  Some this is due to soft tissue attenuation but this is more prominent than the previous study and suggest developing bilateral basilar infiltrates.  Pneumonia is suggested.  Tortuous aorta.  No pneumothorax.  Blunting of the costophrenic angles suggest small pleural effusions bilaterally. Degenerative changes in the spine.  IMPRESSION: Shallow inspiration and cardiac enlargement.  Developing infiltration in the lung bases with  small bilateral pleural effusions.   Original Report Authenticated By: Burman Nieves, M.D.      No diagnosis found.    MDM  Dialysis patient with shortness of breath. She is due to have dialysis today. Chest xray with infiltration of the lung bases with small bilateral pleural effusions. Will treat for pneumonia with rocephin and zithromax. Given albuterol treatment with good improvement in wheezing. Pt stable in ED with no significant deterioration in condition.The patient appears reasonably screened and/or stabilized for discharge and I doubt any other medical condition or other [facility] requiring further screening, evaluation, or treatment in the ED at this time prior to discharge.  MDM Reviewed: nursing note and vitals Interpretation: labs and x-ray           Nicoletta Dress. Colon Branch, MD 04/04/12 [id number]

## 2012-04-04 NOTE — ED Notes (Signed)
Patient placed in gown at this time.

## 2012-04-04 NOTE — ED Notes (Addendum)
Pt arrived by EMS from home, complaining of SOB. EMS states pt w/ audible wheezing when arrived. Pt got dou neb en route. Pt does have dialysis this morning at 0800. Pt did have complete treatment on Wednesday.

## 2012-04-04 NOTE — ED Notes (Signed)
Pt reports being SOB for the past day & half. Pt states her breathing tx did not seem top be working. Pt states she does feel better after tx from EMS. Pt has a productive cough but feel like she needs to cough something up that will come up.

## 2012-04-04 NOTE — ED Notes (Signed)
Pt alert & oriented x4. Patient given discharge instructions, paperwork & prescription(s). Patient verbalized understanding. Pt left department w/ no further questions.  

## 2012-04-08 ENCOUNTER — Telehealth (HOSPITAL_COMMUNITY): Payer: Self-pay | Admitting: Psychiatry

## 2012-04-08 ENCOUNTER — Ambulatory Visit (HOSPITAL_COMMUNITY): Payer: Self-pay | Admitting: Psychiatry

## 2012-04-08 DIAGNOSIS — R52 Pain, unspecified: Secondary | ICD-10-CM

## 2012-04-08 DIAGNOSIS — M62838 Other muscle spasm: Secondary | ICD-10-CM

## 2012-04-08 DIAGNOSIS — F419 Anxiety disorder, unspecified: Secondary | ICD-10-CM

## 2012-04-09 ENCOUNTER — Telehealth (HOSPITAL_COMMUNITY): Payer: Self-pay | Admitting: Psychiatry

## 2012-04-09 MED ORDER — METHOCARBAMOL 500 MG PO TABS: 500.0000 mg | ORAL_TABLET | Freq: Four times a day (QID) | ORAL | Status: DC

## 2012-04-09 MED ORDER — GABAPENTIN 300 MG PO CAPS: 600.0000 mg | ORAL_CAPSULE | Freq: Four times a day (QID) | ORAL | Status: DC

## 2012-04-09 MED ORDER — ALPRAZOLAM 0.5 MG PO TABS: 0.5000 mg | ORAL_TABLET | Freq: Three times a day (TID) | ORAL | Status: DC | PRN

## 2012-04-09 NOTE — Telephone Encounter (Signed)
Renewed Neurontin, Robaxin and Xanax for 7 days until next appointment via eScript

## 2012-04-09 NOTE — Telephone Encounter (Signed)
L/M at work for [employer] indicating that this was Dr Dan Humphreys and that I hoped that the pneumonia gets cleared up.  Further explained that some of the meds have been renewed and that one is addictive and requires hand written script and that it is at the office for pick up.  Indicated that there could be some discussion about changing that to one that is not addictive and could be sent electronically.

## 2012-04-17 ENCOUNTER — Ambulatory Visit (INDEPENDENT_AMBULATORY_CARE_PROVIDER_SITE_OTHER): Payer: Medicare Other | Admitting: Psychiatry

## 2012-04-17 ENCOUNTER — Encounter (HOSPITAL_COMMUNITY): Payer: Self-pay | Admitting: Psychiatry

## 2012-04-17 VITALS — Wt 208.6 lb

## 2012-04-17 DIAGNOSIS — R52 Pain, unspecified: Secondary | ICD-10-CM

## 2012-04-17 DIAGNOSIS — F411 Generalized anxiety disorder: Secondary | ICD-10-CM

## 2012-04-17 DIAGNOSIS — F259 Schizoaffective disorder, unspecified: Secondary | ICD-10-CM

## 2012-04-17 DIAGNOSIS — F418 Other specified anxiety disorders: Secondary | ICD-10-CM | POA: Insufficient documentation

## 2012-04-17 DIAGNOSIS — F329 Major depressive disorder, single episode, unspecified: Secondary | ICD-10-CM

## 2012-04-17 DIAGNOSIS — M62838 Other muscle spasm: Secondary | ICD-10-CM

## 2012-04-17 DIAGNOSIS — F5105 Insomnia due to other mental disorder: Secondary | ICD-10-CM

## 2012-04-17 MED ORDER — ALPRAZOLAM 0.5 MG PO TABS: 0.5000 mg | ORAL_TABLET | Freq: Three times a day (TID) | ORAL | Status: DC | PRN

## 2012-04-17 MED ORDER — AMITRIPTYLINE HCL 25 MG PO TABS: 25.0000 mg | ORAL_TABLET | Freq: Every day | ORAL | Status: DC

## 2012-04-17 MED ORDER — GABAPENTIN 300 MG PO CAPS: 900.0000 mg | ORAL_CAPSULE | Freq: Four times a day (QID) | ORAL | Status: DC

## 2012-04-17 MED ORDER — LIDOCAINE 5 % EX PTCH: 1.0000 | MEDICATED_PATCH | Freq: Two times a day (BID) | CUTANEOUS | Status: DC

## 2012-04-17 MED ORDER — DICLOFENAC SODIUM 1 % TD GEL: 2.0000 g | Freq: Four times a day (QID) | TRANSDERMAL | Status: DC

## 2012-04-17 MED ORDER — METHOCARBAMOL 500 MG PO TABS: 500.0000 mg | ORAL_TABLET | Freq: Four times a day (QID) | ORAL | Status: DC

## 2012-04-17 MED ORDER — CITALOPRAM HYDROBROMIDE 20 MG PO TABS: 20.0000 mg | ORAL_TABLET | Freq: Every day | ORAL | Status: DC

## 2012-04-17 NOTE — Patient Instructions (Addendum)
Ask your doctors about being on Inderal and Mobic for anxiety and for pain management. Ask if she can take Voltaren too.  Relaxation is the ultimate solution for you.  You can seek it through tub baths, bubble baths, essential oils or incense, walking or chatting with friends, listening to soft music, watching a candle burn and just letting all thoughts go and appreciating the true essence of the Creator.  Pets or animals may be very helpful.  You might spend some time with them and then go do more directed meditation.  Yoga is a very helpful exercise method.  On TV, on line, or by DVD Adelfa Koh is a source of high quality information about yoga and videos on yoga.  Renee Ramus is the world's number one video yoga instructor according to some experts.  There are exceptional health benefits that can be achieved through yoga.  The main principles of yoga is acceptance, no competition, no comparison, and no judgement.  It is exceptional in helping people meditate and get to a very relaxed state.   Strongly consider attending at least 6 Alanon Meetings to help you learn about how your helping others to the exclusion of helping yourself is actually hurting yourself and is actually an addiction to fixing others and that you need to work the 12 Step to Happiness through the [employer]. Al-Anon Family Groups could be helpful with how to deal with substance abusing family and friends. Or your own issues of being in victim role.  There are only 40 Alanon Family Group meetings a week here in [place].  Online are current listing of those meetings @ [email or web address]  There are [employer].  Search on line and there you can learn the format and can access the schedule for yourself.  Their number is [phone]  Take care of yourself.  No one else is standing up to do the job and only you know what you need.   GET SERIOUS about taking care of yourself.  Do the next right thing and  that often means doing something to care for yourself along the lines of are you hungry, are you angry, are you lonely, are you tired, are you scared?  HALTS is what that stands for.  Call if problems or concerns.
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
## 2012-04-19 ENCOUNTER — Emergency Department (HOSPITAL_COMMUNITY): Payer: Medicare Other

## 2012-04-19 ENCOUNTER — Encounter (HOSPITAL_COMMUNITY): Payer: Self-pay | Admitting: Emergency Medicine

## 2012-04-19 ENCOUNTER — Inpatient Hospital Stay (HOSPITAL_COMMUNITY)
Admission: EM | Admit: 2012-04-19 | Discharge: 2012-04-21 | DRG: 291 | Disposition: A | Discharge status: Home / Self Care | Payer: Medicare Other | Attending: Internal Medicine | Admitting: Internal Medicine

## 2012-04-19 DIAGNOSIS — Z992 Dependence on renal dialysis: Secondary | ICD-10-CM

## 2012-04-19 DIAGNOSIS — E662 Morbid (severe) obesity with alveolar hypoventilation: Secondary | ICD-10-CM | POA: Diagnosis present

## 2012-04-19 DIAGNOSIS — I509 Heart failure, unspecified: Principal | ICD-10-CM | POA: Diagnosis present

## 2012-04-19 DIAGNOSIS — M129 Arthropathy, unspecified: Secondary | ICD-10-CM | POA: Diagnosis present

## 2012-04-19 DIAGNOSIS — F411 Generalized anxiety disorder: Secondary | ICD-10-CM | POA: Diagnosis present

## 2012-04-19 DIAGNOSIS — J441 Chronic obstructive pulmonary disease with (acute) exacerbation: Secondary | ICD-10-CM | POA: Diagnosis present

## 2012-04-19 DIAGNOSIS — J811 Chronic pulmonary edema: Secondary | ICD-10-CM

## 2012-04-19 DIAGNOSIS — F329 Major depressive disorder, single episode, unspecified: Secondary | ICD-10-CM | POA: Diagnosis present

## 2012-04-19 DIAGNOSIS — K219 Gastro-esophageal reflux disease without esophagitis: Secondary | ICD-10-CM | POA: Diagnosis present

## 2012-04-19 DIAGNOSIS — Z8701 Personal history of pneumonia (recurrent): Secondary | ICD-10-CM

## 2012-04-19 DIAGNOSIS — G609 Hereditary and idiopathic neuropathy, unspecified: Secondary | ICD-10-CM | POA: Diagnosis present

## 2012-04-19 DIAGNOSIS — Z87891 Personal history of nicotine dependence: Secondary | ICD-10-CM

## 2012-04-19 DIAGNOSIS — D631 Anemia in chronic kidney disease: Secondary | ICD-10-CM | POA: Diagnosis present

## 2012-04-19 DIAGNOSIS — F259 Schizoaffective disorder, unspecified: Secondary | ICD-10-CM | POA: Diagnosis present

## 2012-04-19 DIAGNOSIS — Z79899 Other long term (current) drug therapy: Secondary | ICD-10-CM

## 2012-04-19 DIAGNOSIS — J45901 Unspecified asthma with (acute) exacerbation: Secondary | ICD-10-CM

## 2012-04-19 DIAGNOSIS — E785 Hyperlipidemia, unspecified: Secondary | ICD-10-CM | POA: Diagnosis present

## 2012-04-19 DIAGNOSIS — N039 Chronic nephritic syndrome with unspecified morphologic changes: Secondary | ICD-10-CM | POA: Diagnosis present

## 2012-04-19 DIAGNOSIS — F3289 Other specified depressive episodes: Secondary | ICD-10-CM | POA: Diagnosis present

## 2012-04-19 DIAGNOSIS — Z6841 Body Mass Index (BMI) 40.0 and over, adult: Secondary | ICD-10-CM

## 2012-04-19 DIAGNOSIS — E7800 Pure hypercholesterolemia, unspecified: Secondary | ICD-10-CM | POA: Diagnosis present

## 2012-04-19 DIAGNOSIS — I120 Hypertensive chronic kidney disease with stage 5 chronic kidney disease or end stage renal disease: Secondary | ICD-10-CM | POA: Diagnosis present

## 2012-04-19 DIAGNOSIS — N186 End stage renal disease: Secondary | ICD-10-CM | POA: Diagnosis present

## 2012-04-19 DIAGNOSIS — M810 Age-related osteoporosis without current pathological fracture: Secondary | ICD-10-CM | POA: Diagnosis present

## 2012-04-19 LAB — CBC
HCT: 30.7 % — ABNORMAL LOW (ref 36.0–46.0)
Hemoglobin: 9.3 g/dL — ABNORMAL LOW (ref 12.0–15.0)
MCH: 29.3 pg (ref 26.0–34.0)
MCHC: 30.3 g/dL (ref 30.0–36.0)
MCV: 96.8 fL (ref 78.0–100.0)
Platelets: 187 10*3/uL (ref 150–400)
RBC: 3.17 MIL/uL — ABNORMAL LOW (ref 3.87–5.11)
RDW: 16.7 % — ABNORMAL HIGH (ref 11.5–15.5)
WBC: 6.9 10*3/uL (ref 4.0–10.5)

## 2012-04-19 LAB — COMPREHENSIVE METABOLIC PANEL
BUN: 14 mg/dL (ref 6–23)
CO2: 29 mEq/L (ref 19–32)
Calcium: 9 mg/dL (ref 8.4–10.5)
Chloride: 103 mEq/L (ref 96–112)
Creatinine, Ser: 3.96 mg/dL — ABNORMAL HIGH (ref 0.50–1.10)
GFR calc Af Amer: 12 mL/min — ABNORMAL LOW (ref >90)
GFR calc non Af Amer: 11 mL/min — ABNORMAL LOW (ref >90)
Total Bilirubin: 0.2 mg/dL — ABNORMAL LOW (ref 0.3–1.2)

## 2012-04-19 LAB — CBC WITH DIFFERENTIAL/PLATELET
Basophils Absolute: 0 10*3/uL (ref 0.0–0.1)
Eosinophils Relative: 2 % (ref 0–5)
HCT: 29.8 % — ABNORMAL LOW (ref 36.0–46.0)
Hemoglobin: 9.1 g/dL — ABNORMAL LOW (ref 12.0–15.0)
Lymphocytes Relative: 15 % (ref 12–46)
MCHC: 30.5 g/dL (ref 30.0–36.0)
MCV: 96.1 fL (ref 78.0–100.0)
Monocytes Absolute: 0.5 10*3/uL (ref 0.1–1.0)
Monocytes Relative: 6 % (ref 3–12)
Neutro Abs: 6.5 10*3/uL (ref 1.7–7.7)
RDW: 17.1 % — ABNORMAL HIGH (ref 11.5–15.5)
WBC: 8.4 10*3/uL (ref 4.0–10.5)

## 2012-04-19 LAB — BASIC METABOLIC PANEL
Calcium: 9.1 mg/dL (ref 8.4–10.5)
GFR calc Af Amer: 11 mL/min — ABNORMAL LOW (ref >90)
GFR calc non Af Amer: 10 mL/min — ABNORMAL LOW (ref >90)
Glucose, Bld: 124 mg/dL — ABNORMAL HIGH (ref 70–99)
Sodium: 143 mEq/L (ref 135–145)

## 2012-04-19 LAB — TROPONIN I: Troponin I: <0.3 ng/mL (ref <0.30)

## 2012-04-19 LAB — PRO B NATRIURETIC PEPTIDE: Pro B Natriuretic peptide (BNP): 16031 pg/mL — ABNORMAL HIGH (ref 0–125)

## 2012-04-19 MED ORDER — PANTOPRAZOLE SODIUM 40 MG PO TBEC
40.0000 mg | DELAYED_RELEASE_TABLET | Freq: Every day | ORAL | Status: DC
  Administered 2012-04-20 – 2012-04-21 (×2): 40 mg via ORAL
  Filled 2012-04-19 (×3): qty 1

## 2012-04-19 MED ORDER — SODIUM CHLORIDE 0.9 % IJ SOLN
3.0000 mL | Freq: Two times a day (BID) | INTRAMUSCULAR | Status: DC
  Filled 2012-04-19: qty 3

## 2012-04-19 MED ORDER — TORSEMIDE 20 MG PO TABS
50.0000 mg | ORAL_TABLET | Freq: Every day | ORAL | Status: DC
  Administered 2012-04-19 – 2012-04-21 (×3): 50 mg via ORAL
  Filled 2012-04-19: qty 1
  Filled 2012-04-19 (×2): qty 3
  Filled 2012-04-19: qty 2

## 2012-04-19 MED ORDER — ALTEPLASE 2 MG IJ SOLR
2.0000 mg | Freq: Once | INTRAMUSCULAR | Status: AC | PRN
  Filled 2012-04-19: qty 2

## 2012-04-19 MED ORDER — SODIUM CHLORIDE 0.9 % IV SOLN: 100.0000 mL | INTRAVENOUS | Status: DC | PRN

## 2012-04-19 MED ORDER — HEPARIN SODIUM (PORCINE) 1000 UNIT/ML DIALYSIS
1000.0000 [IU] | INTRAMUSCULAR | Status: DC | PRN
  Filled 2012-04-19: qty 1

## 2012-04-19 MED ORDER — OXYCODONE HCL 5 MG PO TABS
5.0000 mg | ORAL_TABLET | Freq: Three times a day (TID) | ORAL | Status: DC | PRN
  Administered 2012-04-20 – 2012-04-21 (×4): 5 mg via ORAL
  Filled 2012-04-19 (×5): qty 1

## 2012-04-19 MED ORDER — LIDOCAINE HCL (PF) 1 % IJ SOLN
5.0000 mL | INTRAMUSCULAR | Status: DC | PRN
  Filled 2012-04-19: qty 5

## 2012-04-19 MED ORDER — GABAPENTIN 300 MG PO CAPS
900.0000 mg | ORAL_CAPSULE | Freq: Four times a day (QID) | ORAL | Status: DC
  Administered 2012-04-19 – 2012-04-20 (×4): 900 mg via ORAL
  Administered 2012-04-20: 600 mg via ORAL
  Administered 2012-04-21: 900 mg via ORAL
  Filled 2012-04-19 (×7): qty 3

## 2012-04-19 MED ORDER — IPRATROPIUM BROMIDE 0.02 % IN SOLN
0.5000 mg | Freq: Four times a day (QID) | RESPIRATORY_TRACT | Status: DC
  Administered 2012-04-19 – 2012-04-21 (×6): 0.5 mg via RESPIRATORY_TRACT
  Filled 2012-04-19 (×6): qty 2.5

## 2012-04-19 MED ORDER — SODIUM CHLORIDE 0.9 % IJ SOLN: 3.0000 mL | INTRAMUSCULAR | Status: DC | PRN

## 2012-04-19 MED ORDER — LIDOCAINE-PRILOCAINE 2.5-2.5 % EX CREA: 1.0000 "application " | TOPICAL_CREAM | CUTANEOUS | Status: DC | PRN

## 2012-04-19 MED ORDER — ADULT MULTIVITAMIN W/MINERALS CH
1.0000 | ORAL_TABLET | Freq: Every day | ORAL | Status: DC
  Administered 2012-04-19 – 2012-04-21 (×3): 1 via ORAL
  Filled 2012-04-19 (×3): qty 1

## 2012-04-19 MED ORDER — CITALOPRAM HYDROBROMIDE 20 MG PO TABS
20.0000 mg | ORAL_TABLET | Freq: Every day | ORAL | Status: DC
  Administered 2012-04-19 – 2012-04-21 (×3): 20 mg via ORAL
  Filled 2012-04-19 (×3): qty 1

## 2012-04-19 MED ORDER — EPOETIN ALFA 10000 UNIT/ML IJ SOLN
10000.0000 [IU] | INTRAMUSCULAR | Status: DC
  Administered 2012-04-19 – 2012-04-21 (×2): 10000 [IU] via INTRAVENOUS
  Filled 2012-04-19 (×2): qty 1

## 2012-04-19 MED ORDER — ALBUTEROL SULFATE (5 MG/ML) 0.5% IN NEBU
5.0000 mg | INHALATION_SOLUTION | Freq: Once | RESPIRATORY_TRACT | Status: AC
  Administered 2012-04-19: 5 mg via RESPIRATORY_TRACT
  Filled 2012-04-19: qty 1

## 2012-04-19 MED ORDER — DONEPEZIL HCL 5 MG PO TABS
5.0000 mg | ORAL_TABLET | Freq: Every day | ORAL | Status: DC
  Administered 2012-04-19 – 2012-04-20 (×2): 5 mg via ORAL
  Filled 2012-04-19 (×3): qty 1

## 2012-04-19 MED ORDER — ALBUTEROL SULFATE (5 MG/ML) 0.5% IN NEBU
2.5000 mg | INHALATION_SOLUTION | RESPIRATORY_TRACT | Status: DC | PRN
  Filled 2012-04-19: qty 0.5

## 2012-04-19 MED ORDER — ALBUTEROL SULFATE (5 MG/ML) 0.5% IN NEBU
2.5000 mg | INHALATION_SOLUTION | Freq: Four times a day (QID) | RESPIRATORY_TRACT | Status: DC
  Administered 2012-04-19 – 2012-04-21 (×6): 2.5 mg via RESPIRATORY_TRACT
  Filled 2012-04-19 (×6): qty 0.5

## 2012-04-19 MED ORDER — ASPIRIN EC 81 MG PO TBEC
81.0000 mg | DELAYED_RELEASE_TABLET | Freq: Every day | ORAL | Status: DC
  Administered 2012-04-19 – 2012-04-21 (×3): 81 mg via ORAL
  Filled 2012-04-19 (×3): qty 1

## 2012-04-19 MED ORDER — ALBUTEROL (5 MG/ML) CONTINUOUS INHALATION SOLN
10.0000 mg/h | INHALATION_SOLUTION | Freq: Once | RESPIRATORY_TRACT | Status: AC
  Administered 2012-04-19: 10 mg/h via RESPIRATORY_TRACT
  Filled 2012-04-19: qty 20

## 2012-04-19 MED ORDER — HEPARIN SODIUM (PORCINE) 1000 UNIT/ML DIALYSIS
20.0000 [IU]/kg | INTRAMUSCULAR | Status: DC | PRN
  Filled 2012-04-19: qty 2

## 2012-04-19 MED ORDER — PENTAFLUOROPROP-TETRAFLUOROETH EX AERO
1.0000 "application " | INHALATION_SPRAY | CUTANEOUS | Status: DC | PRN
  Filled 2012-04-19: qty 103.5

## 2012-04-19 MED ORDER — METHYLPREDNISOLONE SODIUM SUCC 125 MG IJ SOLR: 60.0000 mg | Freq: Two times a day (BID) | INTRAMUSCULAR | Status: DC

## 2012-04-19 MED ORDER — METHOCARBAMOL 500 MG PO TABS
500.0000 mg | ORAL_TABLET | Freq: Four times a day (QID) | ORAL | Status: DC
  Administered 2012-04-19 – 2012-04-21 (×7): 500 mg via ORAL
  Filled 2012-04-19 (×7): qty 1

## 2012-04-19 MED ORDER — ONDANSETRON HCL 4 MG PO TABS
4.0000 mg | ORAL_TABLET | Freq: Four times a day (QID) | ORAL | Status: DC | PRN
  Filled 2012-04-19: qty 1

## 2012-04-19 MED ORDER — AMLODIPINE BESYLATE 5 MG PO TABS
10.0000 mg | ORAL_TABLET | Freq: Every day | ORAL | Status: DC
  Administered 2012-04-19 – 2012-04-21 (×3): 10 mg via ORAL
  Filled 2012-04-19: qty 2
  Filled 2012-04-19: qty 4
  Filled 2012-04-19: qty 2

## 2012-04-19 MED ORDER — NEPRO/CARBSTEADY PO LIQD
237.0000 mL | ORAL | Status: DC | PRN
  Filled 2012-04-19: qty 237

## 2012-04-19 MED ORDER — ONDANSETRON HCL 4 MG/2ML IJ SOLN
4.0000 mg | Freq: Four times a day (QID) | INTRAMUSCULAR | Status: DC | PRN
  Filled 2012-04-19 (×2): qty 2

## 2012-04-19 MED ORDER — LABETALOL HCL 200 MG PO TABS
200.0000 mg | ORAL_TABLET | Freq: Two times a day (BID) | ORAL | Status: DC
  Administered 2012-04-19 – 2012-04-21 (×5): 200 mg via ORAL
  Filled 2012-04-19 (×5): qty 1

## 2012-04-19 MED ORDER — RISPERIDONE 0.5 MG PO TABS
0.5000 mg | ORAL_TABLET | Freq: Two times a day (BID) | ORAL | Status: DC
  Administered 2012-04-19 – 2012-04-20 (×4): 0.5 mg via ORAL
  Filled 2012-04-19 (×4): qty 1

## 2012-04-19 MED ORDER — AMITRIPTYLINE HCL 25 MG PO TABS
25.0000 mg | ORAL_TABLET | Freq: Every day | ORAL | Status: DC
  Administered 2012-04-19 – 2012-04-20 (×2): 25 mg via ORAL
  Filled 2012-04-19 (×3): qty 1

## 2012-04-19 MED ORDER — ENOXAPARIN SODIUM 30 MG/0.3ML ~~LOC~~ SOLN
30.0000 mg | SUBCUTANEOUS | Status: DC
  Administered 2012-04-19 – 2012-04-20 (×2): 30 mg via SUBCUTANEOUS
  Filled 2012-04-19 (×3): qty 0.3

## 2012-04-19 MED ORDER — SODIUM CHLORIDE 0.9 % IV SOLN: 250.0000 mL | INTRAVENOUS | Status: DC | PRN

## 2012-04-19 MED ORDER — SIMVASTATIN 20 MG PO TABS
20.0000 mg | ORAL_TABLET | Freq: Every evening | ORAL | Status: DC
  Administered 2012-04-20 – 2012-04-21 (×2): 20 mg via ORAL
  Filled 2012-04-19 (×3): qty 1

## 2012-04-19 MED ORDER — HEPARIN SODIUM (PORCINE) 1000 UNIT/ML DIALYSIS
300.0000 [IU] | INTRAMUSCULAR | Status: DC | PRN
  Filled 2012-04-19: qty 1

## 2012-04-19 MED ORDER — SENNOSIDES-DOCUSATE SODIUM 8.6-50 MG PO TABS
1.0000 | ORAL_TABLET | Freq: Every evening | ORAL | Status: DC | PRN
  Administered 2012-04-20: 1 via ORAL
  Filled 2012-04-19 (×2): qty 1

## 2012-04-19 MED ORDER — METHYLPREDNISOLONE SODIUM SUCC 125 MG IJ SOLR
125.0000 mg | Freq: Once | INTRAMUSCULAR | Status: AC
  Administered 2012-04-19: 125 mg via INTRAVENOUS
  Filled 2012-04-19: qty 2

## 2012-04-19 MED ORDER — LIDOCAINE 5 % EX PTCH
1.0000 | MEDICATED_PATCH | CUTANEOUS | Status: DC
  Administered 2012-04-19 – 2012-04-21 (×3): 1 via TRANSDERMAL
  Filled 2012-04-19 (×4): qty 1

## 2012-04-19 MED ORDER — SODIUM CHLORIDE 0.9 % IJ SOLN
3.0000 mL | Freq: Two times a day (BID) | INTRAMUSCULAR | Status: DC
  Administered 2012-04-19 – 2012-04-21 (×5): 3 mL via INTRAVENOUS

## 2012-04-19 MED ORDER — ONDANSETRON HCL 4 MG/2ML IJ SOLN
4.0000 mg | Freq: Once | INTRAMUSCULAR | Status: AC
  Administered 2012-04-19: 4 mg via INTRAVENOUS
  Filled 2012-04-19: qty 2

## 2012-04-19 MED ORDER — ALUM & MAG HYDROXIDE-SIMETH 200-200-20 MG/5ML PO SUSP
30.0000 mL | Freq: Four times a day (QID) | ORAL | Status: DC | PRN
  Administered 2012-04-19 – 2012-04-20 (×2): 30 mL via ORAL
  Filled 2012-04-19 (×4): qty 30

## 2012-04-19 NOTE — H&P (Signed)
[facility] History and Physical  [name] ZOX:[id number] DOB: October 29, 1940 DOA: 04/19/2012  Referring physician: Bennie Pierini PCP: Carylon Perches, MD  Specialists: Nephrology  Chief Complaint: Dyspnea, PND  HPI: [name] is a 71 y.o. female with COPD, end-stage renal disease hemodialysis dependent, hypertension, schizoaffective disorder and morbid obesity who presented to the emergency department with acute onset shortness of breath that awoke her from sleep at 2AM. She describes waking with a choking sensation and being unable to catch her breath she had the sensation of panic and experienced profuse diaphoresis. She recently completed a two-week course of azithromycin for an atypical pneumonia that she was seen for an emergency department on 04/04/2012. She denies a cough, but has progressively worsening shortness of breath with minimal exertion. She also complains of worsening peripheral edema and swelling in her lower extremities and abdomen. Her last hemodialysis was 2 days ago. Chest x-ray obtained in the emergency department showed pulmonary edema and vascular congestion.  Admission requested for evaluation and management of pulmonary edema, dyspnea and possible heart failure versus COPD exacerbation.  Review of Systems: Review of Systems  Constitutional: Positive for malaise/fatigue and diaphoresis. Negative for fever, chills and weight loss.  HENT: Positive for congestion.   Eyes: Positive for blurred vision.  Respiratory: Positive for cough, shortness of breath and wheezing.   Cardiovascular: Positive for orthopnea, leg swelling and PND. Negative for chest pain and palpitations.  Gastrointestinal: Positive for heartburn and nausea. Negative for vomiting, abdominal pain, diarrhea, constipation, blood in stool and melena.  Genitourinary: Negative for dysuria and urgency.  Musculoskeletal: Positive for back pain.  Skin: Negative.   Neurological: Positive for dizziness  and weakness. Negative for tingling, tremors, sensory change, speech change, focal weakness, seizures and loss of consciousness.  Endo/Heme/Allergies: Negative.   Psychiatric/Behavioral: Positive for depression. Negative for suicidal ideas, hallucinations, memory loss and substance abuse. The patient is nervous/anxious and has insomnia.   All other systems reviewed and are negative.     Past Medical History  Diagnosis Date  . Hypertension   . Hypercholesterolemia   . Anemia   . GERD (gastroesophageal reflux disease)   . Asthma   . Osteoporosis   . Chronic kidney disease   . Anemia of chronic disease 08/07/2007  . Renal insufficiency 08/03/2010  . OSTEOPOROSIS 08/07/2007  . Peripheral neuropathy 08/03/2010  . Schizoaffective disorder   . Arthritis   . Pneumonia    Past Surgical History  Procedure Laterality Date  . Tubal ligation    . Breast biopsy    . Refractive surgery      right eye  . Bone marrow aspiration    . Bone marrow biopsy    . Insertion of dialysis catheter  01/23/2011    Procedure: INSERTION OF DIALYSIS CATHETER;  Surgeon: Chuck Hint, MD;  Location: [facility] OR;  Service: Vascular;  Laterality: N/A;  Insertion of Dialysis Catheter- Right Internal Jugular  . Yag laser application  12/11/2011    Procedure: YAG LASER APPLICATION;  Surgeon: Loraine Leriche T. Nile Riggs, MD;  Location: [facility] ORS;  Service: Ophthalmology;  Laterality: Left;   Social History:  reports that she has quit smoking. She does not have any smokeless tobacco history on file. She reports that she does not drink alcohol or use illicit drugs. Lives independently with the help of her two daughters.  Allergies  Allergen Reactions  . Ativan (Lorazepam) Nausea Only and Other (See Comments)    Disoriented, not feel like herself   . Cymbalta (  Duloxetine Hcl) Other (See Comments)    Noted hallucinations this, but her primary diagnosis is schizophrenia   . Ferrous Gluconate Nausea Only    hypotension    Family  History  Problem Relation Age of Onset  . Alcohol abuse Brother   . Bipolar disorder Brother   . Dementia Mother   . Depression Mother   . ADD / ADHD Neg Hx   . Anxiety disorder Neg Hx   . Drug abuse Neg Hx   . OCD Neg Hx   . Paranoid behavior Neg Hx   . Schizophrenia Neg Hx   . Seizures Neg Hx   . Physical abuse Neg Hx   . Sexual abuse Neg Hx   . Healthy Daughter   . Healthy Daughter   . Healthy Daughter      Prior to Admission medications   Medication Sig Start Date End Date Taking? Authorizing Provider  acetaminophen (TYLENOL) 500 MG tablet Take 2 tablets (1,000 mg total) by mouth every 6 (six) hours as needed for pain. 01/28/12 01/27/13 Yes Mike Craze, MD  ALPRAZolam Prudy Feeler) 0.5 MG tablet Take 0.5 mg by mouth daily. This is the last 20 that will be prescribed for you. 04/17/12 04/17/13 Yes Mike Craze, MD  amitriptyline (ELAVIL) 25 MG tablet Take 1 tablet (25 mg total) by mouth at bedtime. 04/17/12  Yes Mike Craze, MD  amLODipine (NORVASC) 10 MG tablet Take 1 tablet (10 mg total) by mouth daily. 01/24/11 04/19/12 Yes Carylon Perches, MD  b complex-vitamin c-folic acid (NEPHRO-VITE) 0.8 MG TABS Take 0.8 mg by mouth daily.   Yes Historical Provider, MD  citalopram (CELEXA) 20 MG tablet Take 1 tablet (20 mg total) by mouth daily. 04/17/12  Yes Mike Craze, MD  diclofenac sodium (VOLTAREN) 1 % GEL Apply 2 g topically 4 (four) times daily. 04/17/12  Yes Mike Craze, MD  donepezil (ARICEPT) 5 MG tablet Take 5 mg by mouth at bedtime.  03/21/11  Yes Historical Provider, MD  gabapentin (NEURONTIN) 300 MG capsule Take 3 capsules (900 mg total) by mouth 4 (four) times daily. Shift off the Xanax on to this.  See you on the 3rd 04/17/12  Yes Mike Craze, MD  ipratropium (ATROVENT) 0.02 % nebulizer solution Take 500 mcg by nebulization 4 (four) times daily as needed. Shortness of Breath 11/16/11  Yes Ward Givens, MD  labetalol (NORMODYNE) 100 MG tablet Take 200 mg by mouth 2 (two) times daily.    Yes  Historical Provider, MD  lidocaine (LIDODERM) 5 % Place 1 patch onto the skin every 12 (twelve) hours. Remove & Discard patch within 12 hours or as directed by MD, use TAPE on all four sides. 04/17/12 04/17/13 Yes Mike Craze, MD  methocarbamol (ROBAXIN) 500 MG tablet Take 1 tablet (500 mg total) by mouth 4 (four) times daily. 04/17/12  Yes Mike Craze, MD  oxyCODONE (OXY IR/ROXICODONE) 5 MG immediate release tablet Take 5 mg by mouth 3 (three) times daily as needed. pain 08/21/11  Yes Historical Provider, MD  risperiDONE (RISPERDAL) 0.5 MG tablet Take 1 tablet (0.5 mg total) by mouth 2 (two) times daily. 03/25/12  Yes Mike Craze, MD  simvastatin (ZOCOR) 20 MG tablet Take 20 mg by mouth every evening.   Yes Historical Provider, MD  torsemide (DEMADEX) 100 MG tablet Take 50 mg by mouth daily.  01/24/11  Yes Carylon Perches, MD  azithromycin (ZITHROMAX) 250 MG tablet Take  1 every day  until finished. 04/04/12   Nicoletta Dress. Colon Branch, MD   Physical Exam: Filed Vitals:   04/19/12 0235 04/19/12 0245 04/19/12 0300 04/19/12 0400  BP:   164/63 151/89  Pulse:  56 58 65  Temp:    98.1 F (36.7 C)  TempSrc:    Oral  Resp:  11 10   Height:      Weight:    93.895 kg (207 lb)  SpO2: 100% 100% 100% 93%     General:  Alert and oriented x3, cooperative, no acute distress, obese, no obvious increased work of breathing  Eyes: Normal  ENT: Dry oral mucosa, short neck, narrow pharynx  Neck: supple, no adenopathy  Cardiovascular: RRR  Respiratory: Scattered wheezing but overall fair air movement shallow in bases.  Abdomen: obese, abdominal wall edema, pitting  Skin: no rasjes  Musculoskeletal: moves all 4 extremities, 1+ edema in LE  Psychiatric: normal  Neurologic: non-focal  Labs on Admission:  Basic Metabolic Panel:  Recent Labs Lab 04/19/12 0053  NA 142  K 4.0  CL 103  CO2 29  GLUCOSE 110*  BUN 14  CREATININE 3.96*  CALCIUM 9.0   Liver Function Tests:  Recent Labs Lab 04/19/12 0053   AST 25  ALT 14  ALKPHOS 99  BILITOT 0.2*  PROT 7.2  ALBUMIN 3.3*   No results found for this basename: LIPASE, AMYLASE,  in the last 168 hours No results found for this basename: AMMONIA,  in the last 168 hours CBC:  Recent Labs Lab 04/19/12 0053  WBC 8.4  NEUTROABS 6.5  HGB 9.1*  HCT 29.8*  MCV 96.1  PLT 188   Cardiac Enzymes:  Recent Labs Lab 04/19/12 0053  TROPONINI <0.30    BNP (last 3 results)  Recent Labs  11/16/11 0842 04/19/12 0053  PROBNP 18269.0* 16031.0*   CBG: No results found for this basename: GLUCAP,  in the last 168 hours  Radiological Exams on Admission: Dg Chest Port 1 View  04/19/2012  *RADIOLOGY REPORT*  Clinical Data: Shortness of breath.  High blood pressure.  Dialysis patient.  PORTABLE CHEST - 1 VIEW  Comparison: 04/04/2012  Findings: Shallow inspiration.  Cardiac enlargement with pulmonary vascular congestion.  Hazy perihilar opacities suggest edema.  No blunting of costophrenic angles.  No pneumothorax.  Calcification of the aorta.  IMPRESSION: Cardiac enlargement with pulmonary vascular congestion and perihilar edema.   Original Report Authenticated By: Burman Nieves, M.D.     EKG: Independently reviewed. Sinus Bradycardia.  Assessment/Plan [name] is a 72 year old woman who is being admitted for what is likely an attack of paroxysmal nocturnal dyspnea. Her body habitus is consistent with Pickwickian syndrome which has a high prevalence of sleep apnea. She has never had an evaluation for sleep apnea she has a narrow pharynx and a short neck which also puts her at risk for obstructive sleep apnea the potential for overnight hypoxia and attacks of PND. She does endorse daytime sleepiness and struggles with depression. Her chest x-ray also shows vascular congestion and early pulmonary edema she may need additional hemodialysis. Additionally there is no prior diagnosis of heart failure but this will need complete  evaluation.  Plan: 1. Treat for underlying mild COPD exacerbation with scheduled nebulizers, this does not appear to be a severe flare and she is mildly wheezy on my examination therefore I will not start steroids which may potentially worsen her volume overload at this point we'll reconsider starting them after she has been dialyzed and some of the  pulmonary edema has resolved. 2. Patient has evidence of pulmonary edema this needs to be dialyzed as well the patient needs a 2-D echo to assess for right ventricular enlargement right heart failure or pulmonary artery hypertension. 3. Strongly suspect sleep apnea is being underlying cause of her PND attack, outpatient evaluation, may need supplemental O2 if she has evidence of right heart failure. 4. Obtain interval chest x-ray, daily morning be met in CBC. 5. Monitor hemoglobin she has chronic anemia.  The patient's primary care physician is Dr. Carylon Perches who will soon care this morning.  Time spent: 70 minutes  [facility] Pager [phone]  If 7PM-7AM, please contact night-coverage [email or web address] Password [email or web address] 04/19/2012, 5:14 AM

## 2012-04-19 NOTE — Progress Notes (Signed)
Ran x 4 hours  Pulled 3500 ml. Report called Bayard Hugger RN

## 2012-04-19 NOTE — Consult Note (Signed)
Reason for Consult: End-stage renal disease and CHF. Referring Physician: Dr. Durenda Guthrie [name] is an 72 y.o. female.  HPI: She is a patient who has history of hypertension, COPD and end-stage renal disease on maintenance hemodialysis presently I came to the emergency room with complaints of difficulty in breathing, orthopnea and also paroxysmal nocturnal dyspnea. According to the patient after she left dialysis she started having some difficulty in breathing when she is walking. After that she continued to problem and decided to come to the hospital. Patient was recently treated for comment he acquired pneumonia. Presently she said she has still some cough but seems to be getting better. She denies any sputum production no fever chills or sweating.  Past Medical History  Diagnosis Date  . Hypertension   . Hypercholesterolemia   . Anemia   . GERD (gastroesophageal reflux disease)   . Asthma   . Osteoporosis   . Chronic kidney disease   . Anemia of chronic disease 08/07/2007  . Renal insufficiency 08/03/2010  . OSTEOPOROSIS 08/07/2007  . Peripheral neuropathy 08/03/2010  . Schizoaffective disorder   . Arthritis   . Pneumonia     Past Surgical History  Procedure Laterality Date  . Tubal ligation    . Breast biopsy    . Refractive surgery      right eye  . Bone marrow aspiration    . Bone marrow biopsy    . Insertion of dialysis catheter  01/23/2011    Procedure: INSERTION OF DIALYSIS CATHETER;  Surgeon: Chuck Hint, MD;  Location: [facility] OR;  Service: Vascular;  Laterality: N/A;  Insertion of Dialysis Catheter- Right Internal Jugular  . Yag laser application  12/11/2011    Procedure: YAG LASER APPLICATION;  Surgeon: Loraine Leriche T. Nile Riggs, MD;  Location: [facility] ORS;  Service: Ophthalmology;  Laterality: Left;    Family History  Problem Relation Age of Onset  . Alcohol abuse Brother   . Bipolar disorder Brother   . Dementia Mother   . Depression Mother   . ADD / ADHD Neg Hx   .  Anxiety disorder Neg Hx   . Drug abuse Neg Hx   . OCD Neg Hx   . Paranoid behavior Neg Hx   . Schizophrenia Neg Hx   . Seizures Neg Hx   . Physical abuse Neg Hx   . Sexual abuse Neg Hx   . Healthy Daughter   . Healthy Daughter   . Healthy Daughter     Social History:  reports that she has quit smoking. She does not have any smokeless tobacco history on file. She reports that she does not drink alcohol or use illicit drugs.  Allergies:  Allergies  Allergen Reactions  . Ativan (Lorazepam) Nausea Only and Other (See Comments)    Disoriented, not feel like herself   . Cymbalta (Duloxetine Hcl) Other (See Comments)    Noted hallucinations this, but her primary diagnosis is schizophrenia   . Ferrous Gluconate Nausea Only    hypotension    Medications: I have reviewed the patient's current medications.  Results for orders placed during the hospital encounter of 04/19/12 (from the past 48 hour(s))  CBC WITH DIFFERENTIAL     Status: Abnormal   Collection Time    04/19/12 12:53 AM      Result Value Range   WBC 8.4  4.0 - 10.5 K/uL   RBC 3.10 (*) 3.87 - 5.11 MIL/uL   Hemoglobin 9.1 (*) 12.0 - 15.0 g/dL  HCT 29.8 (*) 36.0 - 46.0 %   MCV 96.1  78.0 - 100.0 fL   MCH 29.4  26.0 - 34.0 pg   MCHC 30.5  30.0 - 36.0 g/dL   RDW 96.0 (*) 45.4 - 09.8 %   Platelets 188  150 - 400 K/uL   Neutrophils Relative 78 (*) 43 - 77 %   Neutro Abs 6.5  1.7 - 7.7 K/uL   Lymphocytes Relative 15  12 - 46 %   Lymphs Abs 1.2  0.7 - 4.0 K/uL   Monocytes Relative 6  3 - 12 %   Monocytes Absolute 0.5  0.1 - 1.0 K/uL   Eosinophils Relative 2  0 - 5 %   Eosinophils Absolute 0.2  0.0 - 0.7 K/uL   Basophils Relative 0  0 - 1 %   Basophils Absolute 0.0  0.0 - 0.1 K/uL  COMPREHENSIVE METABOLIC PANEL     Status: Abnormal   Collection Time    04/19/12 12:53 AM      Result Value Range   Sodium 142  135 - 145 mEq/L   Potassium 4.0  3.5 - 5.1 mEq/L   Chloride 103  96 - 112 mEq/L   CO2 29  19 - 32 mEq/L    Glucose, Bld 110 (*) 70 - 99 mg/dL   BUN 14  6 - 23 mg/dL   Creatinine, Ser 1.19 (*) 0.50 - 1.10 mg/dL   Calcium 9.0  8.4 - 14.7 mg/dL   Total Protein 7.2  6.0 - 8.3 g/dL   Albumin 3.3 (*) 3.5 - 5.2 g/dL   AST 25  0 - 37 U/L   ALT 14  0 - 35 U/L   Alkaline Phosphatase 99  39 - 117 U/L   Total Bilirubin 0.2 (*) 0.3 - 1.2 mg/dL   GFR calc non Af Amer 11 (*) >90 mL/min   GFR calc Af Amer 12 (*) >90 mL/min   Comment:            The eGFR has been calculated     using the CKD EPI equation.     This calculation has not been     validated in all clinical     situations.     eGFR's persistently     <90 mL/min signify     possible Chronic Kidney Disease.  TROPONIN I     Status: None   Collection Time    04/19/12 12:53 AM      Result Value Range   Troponin I <0.30  <0.30 ng/mL   Comment:            Due to the release kinetics of cTnI,     a negative result within the first hours     of the onset of symptoms does not rule out     myocardial infarction with certainty.     If myocardial infarction is still suspected,     repeat the test at appropriate intervals.  PRO B NATRIURETIC PEPTIDE     Status: Abnormal   Collection Time    04/19/12 12:53 AM      Result Value Range   Pro B Natriuretic peptide (BNP) 16031.0 (*) 0 - 125 pg/mL  BASIC METABOLIC PANEL     Status: Abnormal   Collection Time    04/19/12  4:39 AM      Result Value Range   Sodium 143  135 - 145 mEq/L   Potassium 4.3  3.5 - 5.1  mEq/L   Chloride 103  96 - 112 mEq/L   CO2 29  19 - 32 mEq/L   Glucose, Bld 124 (*) 70 - 99 mg/dL   BUN 16  6 - 23 mg/dL   Creatinine, Ser 3.24 (*) 0.50 - 1.10 mg/dL   Calcium 9.1  8.4 - 40.1 mg/dL   GFR calc non Af Amer 10 (*) >90 mL/min   GFR calc Af Amer 11 (*) >90 mL/min   Comment:            The eGFR has been calculated     using the CKD EPI equation.     This calculation has not been     validated in all clinical     situations.     eGFR's persistently     <90 mL/min signify      possible Chronic Kidney Disease.  CBC     Status: Abnormal   Collection Time    04/19/12  4:39 AM      Result Value Range   WBC 6.9  4.0 - 10.5 K/uL   RBC 3.17 (*) 3.87 - 5.11 MIL/uL   Hemoglobin 9.3 (*) 12.0 - 15.0 g/dL   HCT 02.7 (*) 25.3 - 66.4 %   MCV 96.8  78.0 - 100.0 fL   MCH 29.3  26.0 - 34.0 pg   MCHC 30.3  30.0 - 36.0 g/dL   RDW 40.3 (*) 47.4 - 25.9 %   Platelets 187  150 - 400 K/uL    Dg Chest Port 1 View  04/19/2012  *RADIOLOGY REPORT*  Clinical Data: Shortness of breath.  High blood pressure.  Dialysis patient.  PORTABLE CHEST - 1 VIEW  Comparison: 04/04/2012  Findings: Shallow inspiration.  Cardiac enlargement with pulmonary vascular congestion.  Hazy perihilar opacities suggest edema.  No blunting of costophrenic angles.  No pneumothorax.  Calcification of the aorta.  IMPRESSION: Cardiac enlargement with pulmonary vascular congestion and perihilar edema.   Original Report Authenticated By: Burman Nieves, M.D.     Review of Systems  HENT: Positive for congestion.   Respiratory: Positive for cough and shortness of breath. Negative for sputum production.   Cardiovascular: Positive for orthopnea and PND.  Gastrointestinal: Positive for nausea. Negative for vomiting and abdominal pain.  Neurological: Positive for weakness.   Blood pressure 151/89, pulse 65, temperature 98.1 F (36.7 C), temperature source Oral, resp. rate 10, height 4\' 11"  (1.499 m), weight 93.895 kg (207 lb), SpO2 96.00%. Physical Exam  Constitutional: She is oriented to person, place, and time.  Eyes: No scleral icterus.  Neck: No JVD present.  Cardiovascular: Normal rate and regular rhythm.   No murmur heard. Respiratory: No respiratory distress. She has wheezes. She has no rales.  GI: She exhibits no distension. There is no tenderness.  Musculoskeletal: She exhibits edema.  Neurological: She is alert and oriented to person, place, and time.    Assessment/Plan: Problem #1 difficulty breathing  at this moment seems to be secondary to CHF. Patient has completed her treatment for a typical pneumonia. Presently she is a febrile with normal white percent count. Problem #2 hypertension her blood pressure seems to be reasonably controlled. Problem #3 history of anemia she is on Epogen Problem #4 schizoaffective disorder and patient is presently being followed by psychiatry. Recently her medication has been changed because of that patient has been more agitated and also anxious. Patient was seen back about 2 weeks and her medication as been adjusted. Presently she says she is  feeling better but still very anxious and nervous. Problem #5 history of COPD Problem #6 obesity Problem #7 metabolic bone disease calcium is was in acceptable range. Phosphorus is not available.  Plan:We will make  Arrangements for patient to get dialysis today and will try to remove about 3 and half liters if her blood pressure tolerates. We'll check her basic metabolic panel, and phosphorus in the morning. We'll continue with Epogen.   Estel Tonelli S 04/19/2012, 9:46 AM

## 2012-04-19 NOTE — Progress Notes (Cosign Needed)
[name], [name] NO.:  [id number]  MEDICAL RECORD NO.:  [id number]  LOCATION:  A335                          FACILITY:  [facility]  PHYSICIAN:  Mila Homer. Sudie Bailey, M.D.DATE OF BIRTH:  1940/07/09  DATE OF PROCEDURE: DATE OF DISCHARGE:                                PROGRESS NOTE   SUBJECTIVE:  This 72 year old patient of Dr. Carylon Perches presented last night with increased shortness of breath.  She is on hemodialysis 3 times a week.  She is not anuric but has a very decreased urine output.  Her shortness of breath is now somewhat improved.  She smoked for 38 years but stopped 14 years ago.  She has had bronchitis recently but never had asthma as a child.  She does have multiple medical problems including the end-stage renal disease on dialysis, anemia of chronic disease, peripheral neuropathy, schizoaffective disorder, osteoporosis, benign essential hypertension, hyperlipidemia, reflux esophagitis.  She tells me that she has had fairly bad reflux symptoms that she has had all day yesterday along with the shortness of breath.  She is on omeprazole but this does not seem to have helped.  She was on Nexium in the past and that did not do as good a job as omeprazole, apparently.  PHYSICAL EXAMINATION:  VITAL SIGNS:  Temperature 98.1, pulse 65, respiratory 10, BP 151/89 with an O2 saturation of 96%. GENERAL:  Today shows a pleasant 72 year old woman who was semi recumbent in bed.  Her family are in the room with her.  She appears to be oriented and alert.  Her affect is good.  Her speech is normal.  Her sentence structure is intact. HEART:  Her heart appears to have a regular rhythm, rate of about 70 with a 1/6 systolic ejection murmur.  She has diffuse expiatory wheezing noted in the posterior lung fields but she is moving air well.  ASSESSMENT: 1. Dyspnea probably secondary to fluid overload. 2. Chronic obstructive pulmonary disease. 3. History of tobacco use  disorder. 4. Benign essential hypertension. 5. Osteoporosis. 6. End-stage renal disease, now on renal dialysis. 7. Peripheral neuropathy. 8. Anemia of chronic disease.  PLAN:  I discussed all this with the patient and her family.  She was seen by Dr. [name], nephrologist, this morning and the plan is to have another hemodialysis this afternoon aiming to get off 3 liters. Hopefully, this will take care of most of the problem.  Will start her on Protonix 40 mg p.o. daily for the reflux esophagitis symptoms.     Mila Homer. Sudie Bailey, M.D.     SDK/MEDQ  D:  04/19/2012  T:  04/19/2012  Job:  [id number]

## 2012-04-19 NOTE — ED Provider Notes (Signed)
History     CSN: [id number]  Arrival date & time 04/19/12  0031   First MD Initiated Contact with Patient 04/19/12 0036      Chief Complaint  Patient presents with  . Shortness of Breath    (Consider location/radiation/quality/duration/timing/severity/associated sxs/prior treatment) HPI Pt with several days of increasing wheezing. Used neb x 2 yesterday. No cough, fever or chills. HD yesterday. Pt being weaned of Xanax and started on Amitriptyline for sleep. Took first dose tonight. Had worsening SOB and called family into bedroom. Given neb with improvement. Pt has remained drowsy. EMS called. Albuterol 2.5 neb given en route with improvement of wheezing. Pt denies CP, abd pain, N/V/D, lower ext swelling.  Past Medical History  Diagnosis Date  . Hypertension   . Hypercholesterolemia   . Anemia   . GERD (gastroesophageal reflux disease)   . Asthma   . Osteoporosis   . Chronic kidney disease   . Anemia of chronic disease 08/07/2007  . Renal insufficiency 08/03/2010  . OSTEOPOROSIS 08/07/2007  . Peripheral neuropathy 08/03/2010  . Schizoaffective disorder   . Arthritis   . Pneumonia     Past Surgical History  Procedure Laterality Date  . Tubal ligation    . Breast biopsy    . Refractive surgery      right eye  . Bone marrow aspiration    . Bone marrow biopsy    . Insertion of dialysis catheter  01/23/2011    Procedure: INSERTION OF DIALYSIS CATHETER;  Surgeon: Chuck Hint, MD;  Location: [facility] OR;  Service: Vascular;  Laterality: N/A;  Insertion of Dialysis Catheter- Right Internal Jugular  . Yag laser application  12/11/2011    Procedure: YAG LASER APPLICATION;  Surgeon: Loraine Leriche T. Nile Riggs, MD;  Location: [facility] ORS;  Service: Ophthalmology;  Laterality: Left;    Family History  Problem Relation Age of Onset  . Alcohol abuse Brother   . Bipolar disorder Brother   . Dementia Mother   . Depression Mother   . ADD / ADHD Neg Hx   . Anxiety disorder Neg Hx   . Drug abuse  Neg Hx   . OCD Neg Hx   . Paranoid behavior Neg Hx   . Schizophrenia Neg Hx   . Seizures Neg Hx   . Physical abuse Neg Hx   . Sexual abuse Neg Hx   . Healthy Daughter   . Healthy Daughter   . Healthy Daughter     History  Substance Use Topics  . Smoking status: Former Games developer  . Smokeless tobacco: Not on file  . Alcohol Use: No    OB History   Grav Para Term Preterm Abortions TAB SAB Ect Mult Living                  Review of Systems  Constitutional: Negative for fever and chills.  Respiratory: Positive for shortness of breath and wheezing. Negative for cough.   Cardiovascular: Negative for chest pain, palpitations and leg swelling.  Gastrointestinal: Negative for nausea, vomiting and abdominal pain.  Musculoskeletal: Negative for back pain.  Skin: Negative for rash and wound.  All other systems reviewed and are negative.    Allergies  Ativan; Cymbalta; and Ferrous gluconate  Home Medications   Current Outpatient Rx  Name  Route  Sig  Dispense  Refill  . acetaminophen (TYLENOL) 500 MG tablet   Oral   Take 2 tablets (1,000 mg total) by mouth every 6 (six) hours as needed for pain.  120 tablet   2   . ALPRAZolam (XANAX) 0.5 MG tablet   Oral   Take 0.5 mg by mouth daily. This is the last 20 that will be prescribed for you.         Marland Kitchen amitriptyline (ELAVIL) 25 MG tablet   Oral   Take 1 tablet (25 mg total) by mouth at bedtime.   30 tablet   1   . amLODipine (NORVASC) 10 MG tablet   Oral   Take 1 tablet (10 mg total) by mouth daily.   30 tablet   12   . b complex-vitamin c-folic acid (NEPHRO-VITE) 0.8 MG TABS   Oral   Take 0.8 mg by mouth daily.         . citalopram (CELEXA) 20 MG tablet   Oral   Take 1 tablet (20 mg total) by mouth daily.   30 tablet   1   . diclofenac sodium (VOLTAREN) 1 % GEL   Topical   Apply 2 g topically 4 (four) times daily.   100 g   1   . donepezil (ARICEPT) 5 MG tablet   Oral   Take 5 mg by mouth at bedtime.           . gabapentin (NEURONTIN) 300 MG capsule   Oral   Take 3 capsules (900 mg total) by mouth 4 (four) times daily. Shift off the Xanax on to this.  See you on the 3rd   360 capsule   1   . ipratropium (ATROVENT) 0.02 % nebulizer solution   Nebulization   Take 500 mcg by nebulization 4 (four) times daily as needed. Shortness of Breath         . labetalol (NORMODYNE) 100 MG tablet   Oral   Take 200 mg by mouth 2 (two) times daily.          Marland Kitchen lidocaine (LIDODERM) 5 %   Transdermal   Place 1 patch onto the skin every 12 (twelve) hours. Remove & Discard patch within 12 hours or as directed by MD, use TAPE on all four sides.   30 patch   2   . methocarbamol (ROBAXIN) 500 MG tablet   Oral   Take 1 tablet (500 mg total) by mouth 4 (four) times daily.   120 tablet   1   . oxyCODONE (OXY IR/ROXICODONE) 5 MG immediate release tablet   Oral   Take 5 mg by mouth 3 (three) times daily as needed. pain         . risperiDONE (RISPERDAL) 0.5 MG tablet   Oral   Take 1 tablet (0.5 mg total) by mouth 2 (two) times daily.   60 tablet   2     Please deliver   . simvastatin (ZOCOR) 20 MG tablet   Oral   Take 20 mg by mouth every evening.         . torsemide (DEMADEX) 100 MG tablet   Oral   Take 50 mg by mouth daily.          Marland Kitchen azithromycin (ZITHROMAX) 250 MG tablet      Take  1 every day until finished.   4 tablet   0     BP 178/58  Pulse 67  Temp(Src) 98.8 F (37.1 C) (Oral)  Resp 17  Ht 4\' 11"  (1.499 m)  Wt 208 lb (94.348 kg)  BMI 41.99 kg/m2  SpO2 100%  Physical Exam  Nursing note and vitals reviewed. Constitutional:  She is oriented to person, place, and time. She appears well-developed and well-nourished. No distress.  Drowsy but easily aroused  HENT:  Head: Normocephalic and atraumatic.  Mouth/Throat: Oropharynx is clear and moist.  Eyes: EOM are normal. Pupils are equal, round, and reactive to light.  Neck: Normal range of motion. Neck supple.   Cardiovascular: Normal rate and regular rhythm.   Pulmonary/Chest: Effort normal. No respiratory distress. She has wheezes (exp wheezes throughout). She has no rales.  Abdominal: Soft. Bowel sounds are normal. There is no tenderness. There is no rebound and no guarding.  Musculoskeletal: Normal range of motion. She exhibits no edema and no tenderness.  Neurological: She is oriented to person, place, and time.  5/5 motor in all ext, sensation intact  Skin: Skin is warm and dry. No rash noted. No erythema.  Psychiatric: She has a normal mood and affect. Her behavior is normal.    ED Course  Procedures (including critical care time)  Labs Reviewed  CBC WITH DIFFERENTIAL - Abnormal; Notable for the following:    RBC 3.10 (*)    Hemoglobin 9.1 (*)    HCT 29.8 (*)    RDW 17.1 (*)    Neutrophils Relative 78 (*)    All other components within normal limits  COMPREHENSIVE METABOLIC PANEL - Abnormal; Notable for the following:    Glucose, Bld 110 (*)    Creatinine, Ser 3.96 (*)    Albumin 3.3 (*)    Total Bilirubin 0.2 (*)    GFR calc non Af Amer 11 (*)    GFR calc Af Amer 12 (*)    All other components within normal limits  PRO B NATRIURETIC PEPTIDE - Abnormal; Notable for the following:    Pro B Natriuretic peptide (BNP) 16031.0 (*)    All other components within normal limits  TROPONIN I   Dg Chest Port 1 View  04/19/2012  *RADIOLOGY REPORT*  Clinical Data: Shortness of breath.  High blood pressure.  Dialysis patient.  PORTABLE CHEST - 1 VIEW  Comparison: 04/04/2012  Findings: Shallow inspiration.  Cardiac enlargement with pulmonary vascular congestion.  Hazy perihilar opacities suggest edema.  No blunting of costophrenic angles.  No pneumothorax.  Calcification of the aorta.  IMPRESSION: Cardiac enlargement with pulmonary vascular congestion and perihilar edema.   Original Report Authenticated By: Burman Nieves, M.D.      1. Asthma exacerbation   2. Pulmonary edema      Date:  04/19/2012  Rate: 58  Rhythm: normal sinus rhythm  QRS Axis: right  Intervals: normal  ST/T Wave abnormalities: nonspecific T wave changes  Conduction Disutrbances:left anterior fascicular block  Narrative Interpretation:   Old EKG Reviewed: unchanged    MDM  Some improvement in wheezing. Discussed with [facility] who will admit pt.         Loren Racer, MD 04/19/12 [phone]

## 2012-04-19 NOTE — ED Notes (Signed)
Patient found unresponsive per family. Complaining of shortness of breath with wheezing on EMS arrival. Patient started taking Amitriptyline today for first time.

## 2012-04-19 NOTE — Progress Notes (Signed)
Started CAT nebulizer 10mg  of 1 hour

## 2012-04-20 LAB — CBC
MCH: 29.3 pg (ref 26.0–34.0)
MCHC: 30.4 g/dL (ref 30.0–36.0)
MCV: 96.5 fL (ref 78.0–100.0)
Platelets: 196 10*3/uL (ref 150–400)

## 2012-04-20 LAB — PHOSPHORUS: Phosphorus: 3.3 mg/dL (ref 2.3–4.6)

## 2012-04-20 LAB — BASIC METABOLIC PANEL
BUN: 13 mg/dL (ref 6–23)
CO2: 31 mEq/L (ref 19–32)
Calcium: 9.1 mg/dL (ref 8.4–10.5)
Creatinine, Ser: 3.21 mg/dL — ABNORMAL HIGH (ref 0.50–1.10)
GFR calc non Af Amer: 14 mL/min — ABNORMAL LOW (ref >90)
Glucose, Bld: 95 mg/dL (ref 70–99)

## 2012-04-20 NOTE — Progress Notes (Signed)
Subjective: Interval History: has no complaint of difficulty in breathing. Presently she denies any nausea vomiting. Patient states that overall she has improved after dialysis..  Objective: Vital signs in last 24 hours: Temp:  [97.8 F (36.6 C)-98.7 F (37.1 C)] 98.7 F (37.1 C) (04/06 0500) Pulse Rate:  [56-110] 110 (04/06 0500) Resp:  [15-22] 15 (04/06 0500) BP: (129-187)/(59-99) 129/77 mmHg (04/06 0500) SpO2:  [93 %-100 %] 95 % (04/06 0739) Weight change:   Intake/Output from previous day: 04/05 0701 - 04/06 0700 In: 360 [P.O.:360] Out: 3500  Intake/Output this shift:    General appearance: alert, cooperative and no distress Resp: clear to auscultation bilaterally Cardio: regular rate and rhythm, S1, S2 normal, no murmur, click, rub or gallop GI: soft, non-tender; bowel sounds normal; no masses,  no organomegaly Extremities: extremities normal, atraumatic, no cyanosis or edema  Lab Results:  Recent Labs  04/19/12 0439 04/20/12 0650  WBC 6.9 7.4  HGB 9.3* 9.3*  HCT 30.7* 30.6*  PLT 187 196   BMET:  Recent Labs  04/19/12 0439 04/20/12 0650  NA 143 139  K 4.3 3.7  CL 103 97  CO2 29 31  GLUCOSE 124* 95  BUN 16 13  CREATININE 4.28* 3.21*  CALCIUM 9.1 9.1   No results found for this basename: PTH,  in the last 72 hours Iron Studies: No results found for this basename: IRON, TIBC, TRANSFERRIN, FERRITIN,  in the last 72 hours  Studies/Results: Dg Chest Port 1 View  04/19/2012  *RADIOLOGY REPORT*  Clinical Data: Shortness of breath.  High blood pressure.  Dialysis patient.  PORTABLE CHEST - 1 VIEW  Comparison: 04/04/2012  Findings: Shallow inspiration.  Cardiac enlargement with pulmonary vascular congestion.  Hazy perihilar opacities suggest edema.  No blunting of costophrenic angles.  No pneumothorax.  Calcification of the aorta.  IMPRESSION: Cardiac enlargement with pulmonary vascular congestion and perihilar edema.   Original Report Authenticated By: Burman Nieves, M.D.     I have reviewed the patient's current medications.  Assessment/Plan: Problem #1 end-stage renal disease she status post hemodialysis yesterday BUN is 13 creatinine is 3.1 patient doesn't have any uremic sinus symptoms. Her potassium is 3.7. Problem #2 difficulty breathing most likely secondary to CHF patient status post hemodialysis with ultrafiltration presently she is improved. Problem #3 hypertension her blood pressure seems to be fluctuating presently seems to be reasonably controlled Problem #4 anemia her hemoglobin is slightly low she is on Epogen and presently remains stable. Problem #5 schizoaffective disorder Problem #6 anxiety disorder  Problem #7 metabolic bone disease her calcium and phosphorus is was in acceptable range. Plan: We'll continue with present management We'll make arrangements for patient to get dialysis tomorrow. If she is going to be discharged to go to the Center otherwise if she is in the hospital will make arrangement as inpatient. Patient advised also to down her salt and fluid intake.   LOS: 1 day   Dietrich Samuelson S 04/20/2012,9:13 AM

## 2012-04-20 NOTE — Progress Notes (Signed)
NAMETEGHAN, PHILBIN NO.:  [id number]  MEDICAL RECORD NO.:  [id number]  LOCATION:  A335                          FACILITY:  [facility]  PHYSICIAN:  Mila Homer. Sudie Bailey, M.D.DATE OF BIRTH:  23-Feb-1940  DATE OF PROCEDURE: DATE OF DISCHARGE:                                PROGRESS NOTE   SUBJECTIVE:  She feels a lot better today.  OBJECTIVE:  The most recent temperature is 98.7, and pulse has ranged between 56-110, respiratory rate 22, blood pressure has ranged between 129/77 and 187/99. She has lost 7.5 pounds since the dialysis yesterday. She is sitting up in bed eating dinner.  Her 2 daughters are in the room.  She has no acute distress and actually is breathing easier. HEART:  Regular rhythm.  Rate of about 70. LUNGS:  Some expiratory wheezes in the posterior bases but otherwise are clear.  She is moving air well.  There are no intercostal retractions.  Her white cell count today is 7400, hemoglobin 9.3.  Her BUN is 13 with a creatinine of 3.21.  ASSESSMENT: 1. Dyspnea secondary to fluid overload. 2. Chronic obstructive pulmonary disease. 3. End-stage renal disease, now on renal dialysis. 4. Benign essential hypertension. 5. Anemia of chronic disease. 6. Peripheral neuropathy.  PLAN:  I talked to the daughters at length.  Apparently, there is one other admission to the hospital, by their history, secondary to fluid overload, in November, 2013.  We discussed the difficulties of removing just the right amount of fluid from a patient in hemodialysis, particularly if the patient's blood pressure is starting to drop.  The family will talk to nursing at the dialysis center to emphasize their hope that enough fluid will be pulled off the patient at each dialysis to prevent this sort of admission.  I have reviewed the note from Dr. [name], her nephrologist.  She will be seen by Dr. Carylon Perches, her local medical doctor, tomorrow on rounds.     Mila Homer.  Sudie Bailey, M.D.     SDK/MEDQ  D:  04/20/2012  T:  04/20/2012  Job:  [id number]

## 2012-04-21 LAB — BASIC METABOLIC PANEL
BUN: 29 mg/dL — ABNORMAL HIGH (ref 6–23)
Calcium: 9.1 mg/dL (ref 8.4–10.5)
GFR calc Af Amer: 8 mL/min — ABNORMAL LOW (ref >90)
GFR calc non Af Amer: 7 mL/min — ABNORMAL LOW (ref >90)
Potassium: 4.1 mEq/L (ref 3.5–5.1)
Sodium: 137 mEq/L (ref 135–145)

## 2012-04-21 LAB — CBC
MCH: 29.2 pg (ref 26.0–34.0)
MCHC: 30.3 g/dL (ref 30.0–36.0)
RDW: 16.8 % — ABNORMAL HIGH (ref 11.5–15.5)

## 2012-04-21 MED ORDER — HEPARIN SODIUM (PORCINE) 1000 UNIT/ML DIALYSIS
1000.0000 [IU] | INTRAMUSCULAR | Status: DC | PRN
  Filled 2012-04-21: qty 1

## 2012-04-21 MED ORDER — LIDOCAINE HCL (PF) 1 % IJ SOLN
5.0000 mL | INTRAMUSCULAR | Status: DC | PRN
  Filled 2012-04-21: qty 5

## 2012-04-21 MED ORDER — HEPARIN SODIUM (PORCINE) 1000 UNIT/ML DIALYSIS
300.0000 [IU] | INTRAMUSCULAR | Status: DC | PRN
  Filled 2012-04-21: qty 1

## 2012-04-21 MED ORDER — NEPRO/CARBSTEADY PO LIQD
237.0000 mL | ORAL | Status: DC | PRN
  Filled 2012-04-21: qty 237

## 2012-04-21 MED ORDER — PENTAFLUOROPROP-TETRAFLUOROETH EX AERO
1.0000 "application " | INHALATION_SPRAY | CUTANEOUS | Status: DC | PRN
  Filled 2012-04-21: qty 103.5

## 2012-04-21 MED ORDER — PANTOPRAZOLE SODIUM 40 MG PO TBEC: 40.0000 mg | DELAYED_RELEASE_TABLET | Freq: Every day | ORAL | Status: DC

## 2012-04-21 MED ORDER — SODIUM CHLORIDE 0.9 % IV SOLN: 100.0000 mL | INTRAVENOUS | Status: DC | PRN

## 2012-04-21 MED ORDER — LIDOCAINE-PRILOCAINE 2.5-2.5 % EX CREA: 1.0000 "application " | TOPICAL_CREAM | CUTANEOUS | Status: DC | PRN

## 2012-04-21 MED ORDER — HEPARIN SODIUM (PORCINE) 1000 UNIT/ML DIALYSIS
20.0000 [IU]/kg | INTRAMUSCULAR | Status: DC | PRN
  Administered 2012-04-21: 1900 [IU] via INTRAVENOUS_CENTRAL
  Filled 2012-04-21: qty 2

## 2012-04-21 MED ORDER — ALTEPLASE 2 MG IJ SOLR
2.0000 mg | Freq: Once | INTRAMUSCULAR | Status: DC | PRN
  Filled 2012-04-21: qty 2

## 2012-04-21 NOTE — Discharge Summary (Signed)
[name], [name]               ACCOUNT NO.:  [id number]  MEDICAL RECORD NO.:  [id number]  LOCATION:  A335                          FACILITY:  [facility]  PHYSICIAN:  Kingsley Callander. [name], MD       DATE OF BIRTH:  01-15-1941  DATE OF ADMISSION:  04/19/2012 DATE OF DISCHARGE:  LH                              DISCHARGE SUMMARY   DISCHARGE DIAGNOSES: 1. Pulmonary edema. 2. End-stage renal disease. 3. Hypertension. 4. Anemia of chronic disease. 5. Schizoaffective disorder. 6. Peripheral neuropathy. 7. Osteoarthritis. 8. Chronic obstructive pulmonary disease. 9. Hyperlipidemia. 10.Gastroesophageal reflux disease.  DISCHARGE MEDICATIONS:  Acetaminophen 1000 mg q.6 p.r.n., not to exceed 3000 mg per 24 hours; amitriptyline 25 mg at bedtime; amlodipine 10 mg daily; Nephro-Vite 0.8 mg daily; citalopram 20 mg daily; Voltaren 1% gel 2 g topically q.i.d. p.r.n.; donepezil 5 mg at bedtime; gabapentin 300 mg three capsules q.i.d.; ipratropium nebulizer solution q.i.d. p.r.n.; labetalol 200 mg b.i.d.; Lidoderm patch q.12 hours p.r.n.; methocarbamol 500 mg q.i.d. p.r.n.; oxycodone 5 mg t.i.d. p.r.n.; risperidone 0.5 mg b.i.d.; simvastatin 20 mg daily; torsemide 50 mg daily; albuterol neb q.6h p.r.n.; pantoprazole 40 mg daily.  HOSPITAL COURSE:  This patient is a 72 year old female with end-stage renal disease, who presented to the emergency room with shortness of breath.  She was found to have pulmonary edema.  Her BNP was over 16,000.  Her hemoglobin was 9.3.  She was seen in Nephrology consultation by Dr. [name].  She had been dialyzed on Friday, the day before admission.  She was dialyzed again on Saturday.  She felt much better on Sunday and was stable for discharge on Monday morning.  Her hemoglobin today is 9.6.  BUN and creatinine are 29 and 5.6.  She will have dialysis again today.  She has remained hemodynamically stable.  She is feeling much better and is stable for discharge.  On the  morning of the 7th, she will have followup with Nephrology as scheduled and will have followup in my office in 1 week.     Kingsley Callander. [name], MD     ROF/MEDQ  D:  04/21/2012  T:  04/21/2012  Job:  [id number]

## 2012-04-21 NOTE — Progress Notes (Signed)
Subjective: Interval History: has no complaint of difficulty in breathing. Patient overall seems to be feeling better. She denies any nausea vomiting..  Objective: Vital signs in last 24 hours: Temp:  [98.3 F (36.8 C)-98.8 F (37.1 C)] 98.3 F (36.8 C) (04/07 0500) Pulse Rate:  [67-95] 67 (04/07 0500) Resp:  [18-20] 19 (04/07 0500) BP: (122-175)/(61-74) 175/70 mmHg (04/07 0500) SpO2:  [90 %-93 %] 90 % (04/07 0753) FiO2 (%):  [21 %] 21 % (04/07 0753) Weight change:   Intake/Output from previous day:   Intake/Output this shift:    General appearance: alert, cooperative and no distress Resp: clear to auscultation bilaterally Cardio: regular rate and rhythm, S1, S2 normal, no murmur, click, rub or gallop GI: soft, non-tender; bowel sounds normal; no masses,  no organomegaly Extremities: extremities normal, atraumatic, no cyanosis or edema  Lab Results:  Recent Labs  04/20/12 0650 04/21/12 0411  WBC 7.4 7.7  HGB 9.3* 9.6*  HCT 30.6* 31.7*  PLT 196 209   BMET:  Recent Labs  04/20/12 0650 04/21/12 0411  NA 139 137  K 3.7 4.1  CL 97 96  CO2 31 30  GLUCOSE 95 91  BUN 13 29*  CREATININE 3.21* 5.60*  CALCIUM 9.1 9.1   No results found for this basename: PTH,  in the last 72 hours Iron Studies: No results found for this basename: IRON, TIBC, TRANSFERRIN, FERRITIN,  in the last 72 hours  Studies/Results: No results found.  I have reviewed the patient's current medications.  Assessment/Plan: Problem #1 end-stage renal disease she status post hemodialysis on Saturday BUN is 29 creatinine 5.6 potassium is 4.1 stable. Problem #2 CHF presently patient seems to be feeling better. Problem #3 anemia this secondary to end-stage renal disease patient is on Epogen H&H is stable Problem #4 hypertension Problem #5 severe arthritis Problem #6 severe anxiety disorder. Problem #7 metabolic bone disease her calcium and phosphorus in acceptable range. Plan: We'll do dialysis  today We'll try to remove about 3 and half liters if her blood pressure tolerates. We'll continue his other medications and we'll follow patient when she is discharged.   LOS: 2 days   Calisha Tindel S 04/21/2012,9:31 AM

## 2012-04-21 NOTE — Procedures (Signed)
4 hour hemodialysis treatment completed through left upper arm AVF (15g needles ante/retrograde).  GOAL NOT MET:  Unable to tolerate more than 3.3L removal due to cramping.  Ultrafiltration was interrupted 30 minutes.  Increasing hypertension during HD. Primary RN gave Labetalol and Norvasc during HD session with some improvement.  All blood was reinfused.  Prolonged bleeding, presumably due to hypertension; hemostasis achieved in 30 minutes.  Report given to Zannie Kehr, RN

## 2012-04-21 NOTE — Care Management Note (Signed)
    Page 1 of 1   04/21/2012     9:50:08 AM   CARE MANAGEMENT NOTE 04/21/2012  Patient:  [name], [name]   Account Number:  [id number]  Date Initiated:  04/21/2012  Documentation initiated by:  Sharrie Rothman  Subjective/Objective Assessment:   Pt admitted from home with pulmonary edema. Pt lives with her husband and will return home at discharge. Pt receives dialysis 3 times a week at [facility]. Pts husband or daughter takes pt to dialysis. Has walker for home use.     Action/Plan:   No CM needs noted.   Anticipated DC Date:  04/21/2012   Anticipated DC Plan:  HOME/SELF CARE      DC Planning Services  CM consult      Choice offered to / List presented to:             Status of service:  Completed, signed off Medicare Important Message given?  YES (If response is "NO", the following Medicare IM given date fields will be blank) Date Medicare IM given:  04/21/2012 Date Additional Medicare IM given:    Discharge Disposition:  HOME/SELF CARE  Per UR Regulation:    If discussed at Long Length of Stay Meetings, dates discussed:    Comments:  04/21/12 0950 Arlyss Queen, RN BSN CM

## 2012-04-21 NOTE — Progress Notes (Signed)
Pt discharged with instructions and prescriptions.  Her and her daughter verbalized understanding.  The patient left the floor via w/c with staff in stable condition.

## 2012-04-23 ENCOUNTER — Telehealth (HOSPITAL_COMMUNITY): Payer: Self-pay | Admitting: Psychiatry

## 2012-04-23 DIAGNOSIS — M62838 Other muscle spasm: Secondary | ICD-10-CM

## 2012-04-23 MED ORDER — METHOCARBAMOL 500 MG PO TABS: 500.0000 mg | ORAL_TABLET | Freq: Four times a day (QID) | ORAL | Status: DC

## 2012-04-23 NOTE — Telephone Encounter (Signed)
Refill request approved via eScripts.  

## 2012-04-29 NOTE — Progress Notes (Signed)
UR Chart Review Completed  

## 2012-05-06 ENCOUNTER — Telehealth (HOSPITAL_COMMUNITY): Payer: Self-pay | Admitting: Psychiatry

## 2012-05-06 DIAGNOSIS — F329 Major depressive disorder, single episode, unspecified: Secondary | ICD-10-CM

## 2012-05-06 MED ORDER — RISPERIDONE 0.5 MG PO TABS: ORAL_TABLET | ORAL | Status: DC

## 2012-05-06 NOTE — Telephone Encounter (Signed)
Faxed request form pharmacy answered by eScript.

## 2012-05-15 ENCOUNTER — Ambulatory Visit (INDEPENDENT_AMBULATORY_CARE_PROVIDER_SITE_OTHER): Payer: Medicare Other | Admitting: Psychiatry

## 2012-05-15 ENCOUNTER — Encounter (HOSPITAL_COMMUNITY): Payer: Self-pay | Admitting: Psychiatry

## 2012-05-15 VITALS — BP 120/84 | Ht <= 58 in | Wt 198.8 lb

## 2012-05-15 DIAGNOSIS — F341 Dysthymic disorder: Secondary | ICD-10-CM

## 2012-05-15 DIAGNOSIS — F259 Schizoaffective disorder, unspecified: Secondary | ICD-10-CM

## 2012-05-15 DIAGNOSIS — M62838 Other muscle spasm: Secondary | ICD-10-CM

## 2012-05-15 DIAGNOSIS — R52 Pain, unspecified: Secondary | ICD-10-CM

## 2012-05-15 DIAGNOSIS — M549 Dorsalgia, unspecified: Secondary | ICD-10-CM

## 2012-05-15 DIAGNOSIS — G8929 Other chronic pain: Secondary | ICD-10-CM | POA: Insufficient documentation

## 2012-05-15 DIAGNOSIS — G2581 Restless legs syndrome: Secondary | ICD-10-CM

## 2012-05-15 DIAGNOSIS — F418 Other specified anxiety disorders: Secondary | ICD-10-CM

## 2012-05-15 DIAGNOSIS — F329 Major depressive disorder, single episode, unspecified: Secondary | ICD-10-CM

## 2012-05-15 MED ORDER — CITALOPRAM HYDROBROMIDE 20 MG PO TABS: 20.0000 mg | ORAL_TABLET | Freq: Every day | ORAL | Status: DC

## 2012-05-15 MED ORDER — DICLOFENAC SODIUM 1 % TD GEL: 2.0000 g | Freq: Four times a day (QID) | TRANSDERMAL | Status: DC

## 2012-05-15 MED ORDER — GABAPENTIN 100 MG PO CAPS: 100.0000 mg | ORAL_CAPSULE | Freq: Every day | ORAL | Status: DC

## 2012-05-15 MED ORDER — AMITRIPTYLINE HCL 25 MG PO TABS: 25.0000 mg | ORAL_TABLET | Freq: Every day | ORAL | Status: DC

## 2012-05-15 MED ORDER — RISPERIDONE 0.5 MG PO TABS: ORAL_TABLET | ORAL | Status: DC

## 2012-05-15 MED ORDER — ALPRAZOLAM 0.5 MG PO TABS: ORAL_TABLET | ORAL | Status: DC

## 2012-05-15 MED ORDER — METHOCARBAMOL 500 MG PO TABS: 500.0000 mg | ORAL_TABLET | Freq: Four times a day (QID) | ORAL | Status: DC

## 2012-05-15 MED ORDER — LIDOCAINE 5 % EX PTCH: 1.0000 | MEDICATED_PATCH | Freq: Two times a day (BID) | CUTANEOUS | Status: DC
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
## 2012-05-15 NOTE — Patient Instructions (Addendum)
Set a timer for 8 or a certain number minutes and walk for that amount of time in the house or in the yard.  Mark the number of minutes on a calendar for that day.  Do that every day this week.  Then next week increase the time by 1 minutes and then mark the calendar with the number of minutes for that day.  Each week increase your exercise by one minute.  Keep a record of this so you can see the progress you are making.  Do this every day, just like eating and sleeping.  It is good for pain control, depression, and for your soul/spirit.  Bring the record in for your next visit so we can talk about your effort and how you feel with the new exercise program going and working for you.  See if PCP will be interested in renewing the Robaxin, Volatran, Lidoderm, and perhaps Elavil for the pain management.  Take care of yourself.  No one else is standing up to do the job and only you know what you need.   GET SERIOUS about taking care of yourself.  Do the next right thing and that often means doing something to care for yourself along the lines of are you hungry, are you angry, are you lonely, are you tired, are you scared?  [employer] is what that stands for.  Call if problems or concerns.

## 2012-05-21 ENCOUNTER — Other Ambulatory Visit (HOSPITAL_COMMUNITY): Payer: Self-pay

## 2012-05-21 DIAGNOSIS — G4730 Sleep apnea, unspecified: Secondary | ICD-10-CM

## 2012-06-13 ENCOUNTER — Other Ambulatory Visit (HOSPITAL_COMMUNITY): Payer: Self-pay | Admitting: Oncology

## 2012-06-16 ENCOUNTER — Telehealth (HOSPITAL_COMMUNITY): Payer: Self-pay | Admitting: Psychiatry

## 2012-06-16 DIAGNOSIS — F329 Major depressive disorder, single episode, unspecified: Secondary | ICD-10-CM

## 2012-06-16 MED ORDER — RISPERIDONE 0.5 MG PO TABS: ORAL_TABLET | ORAL | Status: DC

## 2012-06-16 NOTE — Telephone Encounter (Signed)
Script revised per pharmacist request via eScript

## 2012-07-10 ENCOUNTER — Other Ambulatory Visit (HOSPITAL_COMMUNITY): Payer: Self-pay | Admitting: Psychiatry

## 2012-07-15 ENCOUNTER — Ambulatory Visit (HOSPITAL_COMMUNITY): Payer: Self-pay | Admitting: Psychiatry

## 2012-07-17 ENCOUNTER — Ambulatory Visit (INDEPENDENT_AMBULATORY_CARE_PROVIDER_SITE_OTHER): Payer: Medicare Other | Admitting: Psychiatry

## 2012-07-17 ENCOUNTER — Encounter (HOSPITAL_COMMUNITY): Payer: Self-pay | Admitting: Psychiatry

## 2012-07-17 ENCOUNTER — Ambulatory Visit (HOSPITAL_COMMUNITY): Payer: Self-pay | Admitting: Psychiatry

## 2012-07-17 DIAGNOSIS — F5105 Insomnia due to other mental disorder: Secondary | ICD-10-CM

## 2012-07-17 DIAGNOSIS — F259 Schizoaffective disorder, unspecified: Secondary | ICD-10-CM

## 2012-07-17 DIAGNOSIS — F341 Dysthymic disorder: Secondary | ICD-10-CM

## 2012-07-17 DIAGNOSIS — F419 Anxiety disorder, unspecified: Secondary | ICD-10-CM

## 2012-07-17 MED ORDER — ALPRAZOLAM 0.5 MG PO TABS: ORAL_TABLET | ORAL | Status: DC
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
## 2012-09-09 ENCOUNTER — Other Ambulatory Visit (HOSPITAL_COMMUNITY): Payer: Self-pay | Admitting: Psychiatry

## 2012-09-18 ENCOUNTER — Ambulatory Visit (HOSPITAL_COMMUNITY): Payer: Self-pay | Admitting: Psychiatry

## 2012-09-18 ENCOUNTER — Ambulatory Visit (INDEPENDENT_AMBULATORY_CARE_PROVIDER_SITE_OTHER): Payer: Medicare Other | Admitting: Psychiatry

## 2012-09-18 ENCOUNTER — Encounter (HOSPITAL_COMMUNITY): Payer: Self-pay | Admitting: Psychiatry

## 2012-09-18 VITALS — BP 120/70 | Ht 59.0 in | Wt 212.0 lb

## 2012-09-18 DIAGNOSIS — F329 Major depressive disorder, single episode, unspecified: Secondary | ICD-10-CM

## 2012-09-18 DIAGNOSIS — F418 Other specified anxiety disorders: Secondary | ICD-10-CM

## 2012-09-18 DIAGNOSIS — F341 Dysthymic disorder: Secondary | ICD-10-CM

## 2012-09-18 DIAGNOSIS — F259 Schizoaffective disorder, unspecified: Secondary | ICD-10-CM

## 2012-09-18 MED ORDER — CITALOPRAM HYDROBROMIDE 20 MG PO TABS: 20.0000 mg | ORAL_TABLET | Freq: Every day | ORAL | Status: DC

## 2012-09-18 MED ORDER — RISPERIDONE 0.5 MG PO TABS: 1.0000 mg | ORAL_TABLET | Freq: Every day | ORAL | Status: DC

## 2012-09-18 MED ORDER — ALPRAZOLAM 0.5 MG PO TABS: ORAL_TABLET | ORAL | Status: DC

## 2012-09-18 MED ORDER — AMITRIPTYLINE HCL 25 MG PO TABS: 25.0000 mg | ORAL_TABLET | Freq: Every day | ORAL | Status: DC
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
## 2012-10-09 ENCOUNTER — Other Ambulatory Visit (HOSPITAL_COMMUNITY): Payer: Self-pay | Admitting: Psychiatry

## 2012-11-06 ENCOUNTER — Other Ambulatory Visit (HOSPITAL_COMMUNITY): Payer: Self-pay | Admitting: Psychiatry

## 2012-12-09 ENCOUNTER — Other Ambulatory Visit (HOSPITAL_COMMUNITY): Payer: Self-pay | Admitting: Psychiatry

## 2012-12-18 ENCOUNTER — Ambulatory Visit (HOSPITAL_COMMUNITY): Payer: Self-pay | Admitting: Psychiatry

## 2012-12-22 ENCOUNTER — Other Ambulatory Visit (HOSPITAL_COMMUNITY): Payer: Self-pay | Admitting: Psychiatry

## 2012-12-22 ENCOUNTER — Telehealth (HOSPITAL_COMMUNITY): Payer: Self-pay | Admitting: Psychiatry

## 2012-12-22 DIAGNOSIS — F329 Major depressive disorder, single episode, unspecified: Secondary | ICD-10-CM

## 2012-12-22 DIAGNOSIS — F418 Other specified anxiety disorders: Secondary | ICD-10-CM

## 2012-12-22 MED ORDER — ALPRAZOLAM 0.5 MG PO TABS: ORAL_TABLET | ORAL | Status: DC

## 2012-12-22 NOTE — Telephone Encounter (Signed)
Faxed to pharmacy last week.

## 2013-01-02 ENCOUNTER — Other Ambulatory Visit (HOSPITAL_COMMUNITY): Payer: Self-pay | Admitting: Psychiatry

## 2013-02-02 ENCOUNTER — Encounter (HOSPITAL_COMMUNITY): Payer: Medicare Other | Attending: Hematology and Oncology

## 2013-02-02 ENCOUNTER — Encounter (HOSPITAL_BASED_OUTPATIENT_CLINIC_OR_DEPARTMENT_OTHER): Payer: Medicare Other

## 2013-02-02 VITALS — BP 121/78 | HR 69 | Resp 20

## 2013-02-02 DIAGNOSIS — M810 Age-related osteoporosis without current pathological fracture: Secondary | ICD-10-CM | POA: Insufficient documentation

## 2013-02-02 LAB — COMPREHENSIVE METABOLIC PANEL
ALBUMIN: 3.6 g/dL (ref 3.5–5.2)
ALK PHOS: 78 U/L (ref 39–117)
ALT: 14 U/L (ref 0–35)
AST: 21 U/L (ref 0–37)
BILIRUBIN TOTAL: 0.3 mg/dL (ref 0.3–1.2)
BUN: 17 mg/dL (ref 6–23)
CHLORIDE: 96 meq/L (ref 96–112)
CO2: 26 meq/L (ref 19–32)
Calcium: 9.1 mg/dL (ref 8.4–10.5)
Creatinine, Ser: 5.36 mg/dL — ABNORMAL HIGH (ref 0.50–1.10)
GFR calc Af Amer: 8 mL/min — ABNORMAL LOW (ref >90)
GFR, EST NON AFRICAN AMERICAN: 7 mL/min — AB (ref >90)
GLUCOSE: 128 mg/dL — AB (ref 70–99)
POTASSIUM: 3.9 meq/L (ref 3.7–5.3)
Sodium: 139 mEq/L (ref 137–147)
Total Protein: 8.4 g/dL — ABNORMAL HIGH (ref 6.0–8.3)

## 2013-02-02 MED ORDER — DENOSUMAB 60 MG/ML ~~LOC~~ SOLN
60.0000 mg | Freq: Once | SUBCUTANEOUS | Status: AC
  Administered 2013-02-02: 60 mg via SUBCUTANEOUS
  Filled 2013-02-02: qty 1

## 2013-02-02 NOTE — Progress Notes (Signed)
Heather MoraleGracie M Fulford presents today for injection per MD orders. Prolia 60 mg administered SQ in right Abdomen. Administration without incident. Patient tolerated well.

## 2013-02-02 NOTE — Progress Notes (Signed)
Labs drawn today for cmp 

## 2013-02-27 ENCOUNTER — Ambulatory Visit (HOSPITAL_COMMUNITY): Payer: Self-pay | Admitting: Psychiatry

## 2013-03-03 ENCOUNTER — Ambulatory Visit (HOSPITAL_COMMUNITY): Payer: Self-pay | Admitting: Psychiatry

## 2013-03-06 ENCOUNTER — Telehealth (HOSPITAL_COMMUNITY): Payer: Self-pay | Admitting: *Deleted

## 2013-03-06 ENCOUNTER — Other Ambulatory Visit (HOSPITAL_COMMUNITY): Payer: Self-pay | Admitting: Psychiatry

## 2013-03-06 MED ORDER — RISPERIDONE 0.5 MG PO TABS: 1.0000 mg | ORAL_TABLET | Freq: Every day | ORAL | Status: DC

## 2013-03-06 MED ORDER — CITALOPRAM HYDROBROMIDE 20 MG PO TABS: 20.0000 mg | ORAL_TABLET | Freq: Every day | ORAL | Status: DC

## 2013-03-06 NOTE — Telephone Encounter (Signed)
Refills sent

## 2013-03-12 ENCOUNTER — Ambulatory Visit (HOSPITAL_COMMUNITY): Payer: Self-pay | Admitting: Psychiatry

## 2013-03-19 ENCOUNTER — Encounter (HOSPITAL_COMMUNITY): Payer: Self-pay | Admitting: Psychiatry

## 2013-03-19 ENCOUNTER — Ambulatory Visit (INDEPENDENT_AMBULATORY_CARE_PROVIDER_SITE_OTHER): Payer: Medicare Other | Admitting: Psychiatry

## 2013-03-19 VITALS — BP 110/70 | Ht 59.0 in | Wt 219.0 lb

## 2013-03-19 DIAGNOSIS — F5105 Insomnia due to other mental disorder: Secondary | ICD-10-CM

## 2013-03-19 DIAGNOSIS — F32A Depression, unspecified: Secondary | ICD-10-CM

## 2013-03-19 DIAGNOSIS — F341 Dysthymic disorder: Secondary | ICD-10-CM

## 2013-03-19 DIAGNOSIS — F419 Anxiety disorder, unspecified: Principal | ICD-10-CM

## 2013-03-19 DIAGNOSIS — F329 Major depressive disorder, single episode, unspecified: Secondary | ICD-10-CM

## 2013-03-19 DIAGNOSIS — F418 Other specified anxiety disorders: Secondary | ICD-10-CM

## 2013-03-19 DIAGNOSIS — F259 Schizoaffective disorder, unspecified: Secondary | ICD-10-CM

## 2013-03-19 MED ORDER — RISPERIDONE 0.5 MG PO TABS: 1.0000 mg | ORAL_TABLET | Freq: Every day | ORAL | Status: DC

## 2013-03-19 MED ORDER — ALPRAZOLAM 0.5 MG PO TABS: ORAL_TABLET | ORAL | Status: DC

## 2013-03-19 MED ORDER — CITALOPRAM HYDROBROMIDE 20 MG PO TABS: 20.0000 mg | ORAL_TABLET | Freq: Every day | ORAL | Status: DC

## 2013-03-19 MED ORDER — AMITRIPTYLINE HCL 25 MG PO TABS: 25.0000 mg | ORAL_TABLET | Freq: Every day | ORAL | Status: DC
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
## 2013-03-30 ENCOUNTER — Observation Stay (HOSPITAL_COMMUNITY)
Admission: EM | Admit: 2013-03-30 | Discharge: 2013-03-31 | Disposition: A | Discharge status: Home / Self Care | Payer: Medicare Other | Attending: Internal Medicine | Admitting: Internal Medicine

## 2013-03-30 ENCOUNTER — Emergency Department (HOSPITAL_COMMUNITY): Payer: Medicare Other

## 2013-03-30 ENCOUNTER — Encounter (HOSPITAL_COMMUNITY): Payer: Self-pay | Admitting: Emergency Medicine

## 2013-03-30 DIAGNOSIS — E7529 Other sphingolipidosis: Secondary | ICD-10-CM | POA: Insufficient documentation

## 2013-03-30 DIAGNOSIS — K219 Gastro-esophageal reflux disease without esophagitis: Secondary | ICD-10-CM | POA: Diagnosis present

## 2013-03-30 DIAGNOSIS — N186 End stage renal disease: Secondary | ICD-10-CM | POA: Diagnosis present

## 2013-03-30 DIAGNOSIS — F411 Generalized anxiety disorder: Secondary | ICD-10-CM | POA: Insufficient documentation

## 2013-03-30 DIAGNOSIS — F419 Anxiety disorder, unspecified: Secondary | ICD-10-CM | POA: Diagnosis present

## 2013-03-30 DIAGNOSIS — J449 Chronic obstructive pulmonary disease, unspecified: Secondary | ICD-10-CM | POA: Diagnosis present

## 2013-03-30 DIAGNOSIS — N19 Unspecified kidney failure: Secondary | ICD-10-CM

## 2013-03-30 DIAGNOSIS — R110 Nausea: Secondary | ICD-10-CM | POA: Insufficient documentation

## 2013-03-30 DIAGNOSIS — Z87891 Personal history of nicotine dependence: Secondary | ICD-10-CM | POA: Insufficient documentation

## 2013-03-30 DIAGNOSIS — M545 Low back pain, unspecified: Secondary | ICD-10-CM | POA: Insufficient documentation

## 2013-03-30 DIAGNOSIS — E7800 Pure hypercholesterolemia, unspecified: Secondary | ICD-10-CM | POA: Insufficient documentation

## 2013-03-30 DIAGNOSIS — G609 Hereditary and idiopathic neuropathy, unspecified: Secondary | ICD-10-CM | POA: Insufficient documentation

## 2013-03-30 DIAGNOSIS — F329 Major depressive disorder, single episode, unspecified: Secondary | ICD-10-CM

## 2013-03-30 DIAGNOSIS — J4489 Other specified chronic obstructive pulmonary disease: Secondary | ICD-10-CM | POA: Insufficient documentation

## 2013-03-30 DIAGNOSIS — D631 Anemia in chronic kidney disease: Secondary | ICD-10-CM | POA: Insufficient documentation

## 2013-03-30 DIAGNOSIS — I10 Essential (primary) hypertension: Secondary | ICD-10-CM | POA: Diagnosis present

## 2013-03-30 DIAGNOSIS — G8929 Other chronic pain: Secondary | ICD-10-CM | POA: Diagnosis present

## 2013-03-30 DIAGNOSIS — N2581 Secondary hyperparathyroidism of renal origin: Secondary | ICD-10-CM | POA: Insufficient documentation

## 2013-03-30 DIAGNOSIS — N039 Chronic nephritic syndrome with unspecified morphologic changes: Secondary | ICD-10-CM

## 2013-03-30 DIAGNOSIS — R079 Chest pain, unspecified: Principal | ICD-10-CM | POA: Diagnosis present

## 2013-03-30 DIAGNOSIS — F259 Schizoaffective disorder, unspecified: Secondary | ICD-10-CM | POA: Diagnosis present

## 2013-03-30 DIAGNOSIS — M549 Dorsalgia, unspecified: Secondary | ICD-10-CM

## 2013-03-30 DIAGNOSIS — Z992 Dependence on renal dialysis: Secondary | ICD-10-CM | POA: Insufficient documentation

## 2013-03-30 DIAGNOSIS — F32A Depression, unspecified: Secondary | ICD-10-CM | POA: Diagnosis present

## 2013-03-30 DIAGNOSIS — G3180 Leukodystrophy, unspecified: Secondary | ICD-10-CM | POA: Insufficient documentation

## 2013-03-30 DIAGNOSIS — D638 Anemia in other chronic diseases classified elsewhere: Secondary | ICD-10-CM | POA: Diagnosis present

## 2013-03-30 DIAGNOSIS — I120 Hypertensive chronic kidney disease with stage 5 chronic kidney disease or end stage renal disease: Secondary | ICD-10-CM | POA: Insufficient documentation

## 2013-03-30 DIAGNOSIS — F341 Dysthymic disorder: Secondary | ICD-10-CM

## 2013-03-30 HISTORY — DX: Personal history of pneumonia (recurrent): Z87.01

## 2013-03-30 HISTORY — DX: Dependence on renal dialysis: Z99.2

## 2013-03-30 HISTORY — DX: Essential (primary) hypertension: I10

## 2013-03-30 HISTORY — DX: End stage renal disease: N18.6

## 2013-03-30 LAB — CBC WITH DIFFERENTIAL/PLATELET
Basophils Absolute: 0 10*3/uL (ref 0.0–0.1)
Basophils Relative: 0 % (ref 0–1)
EOS ABS: 0.5 10*3/uL (ref 0.0–0.7)
Eosinophils Relative: 7 % — ABNORMAL HIGH (ref 0–5)
HCT: 35.4 % — ABNORMAL LOW (ref 36.0–46.0)
Hemoglobin: 10.9 g/dL — ABNORMAL LOW (ref 12.0–15.0)
LYMPHS ABS: 1 10*3/uL (ref 0.7–4.0)
Lymphocytes Relative: 13 % (ref 12–46)
MCH: 30.4 pg (ref 26.0–34.0)
MCHC: 30.8 g/dL (ref 30.0–36.0)
MCV: 98.6 fL (ref 78.0–100.0)
Monocytes Absolute: 0.4 10*3/uL (ref 0.1–1.0)
Monocytes Relative: 5 % (ref 3–12)
Neutro Abs: 5.3 10*3/uL (ref 1.7–7.7)
Neutrophils Relative %: 74 % (ref 43–77)
Platelets: 198 10*3/uL (ref 150–400)
RBC: 3.59 MIL/uL — ABNORMAL LOW (ref 3.87–5.11)
RDW: 15.1 % (ref 11.5–15.5)
WBC: 7.2 10*3/uL (ref 4.0–10.5)

## 2013-03-30 LAB — BASIC METABOLIC PANEL
BUN: 16 mg/dL (ref 6–23)
CALCIUM: 7.7 mg/dL — AB (ref 8.4–10.5)
CO2: 30 meq/L (ref 19–32)
CREATININE: 6.38 mg/dL — AB (ref 0.50–1.10)
Chloride: 94 mEq/L — ABNORMAL LOW (ref 96–112)
GFR calc Af Amer: 7 mL/min — ABNORMAL LOW (ref >90)
GFR calc non Af Amer: 6 mL/min — ABNORMAL LOW (ref >90)
GLUCOSE: 109 mg/dL — AB (ref 70–99)
Potassium: 4.3 mEq/L (ref 3.7–5.3)
Sodium: 138 mEq/L (ref 137–147)

## 2013-03-30 LAB — PRO B NATRIURETIC PEPTIDE: Pro B Natriuretic peptide (BNP): 2732 pg/mL — ABNORMAL HIGH (ref 0–125)

## 2013-03-30 LAB — I-STAT TROPONIN, ED: TROPONIN I, POC: 0.01 ng/mL (ref 0.00–0.08)

## 2013-03-30 LAB — TROPONIN I: Troponin I: <0.3 ng/mL (ref <0.30)

## 2013-03-30 MED ORDER — TORSEMIDE 20 MG PO TABS
50.0000 mg | ORAL_TABLET | Freq: Every day | ORAL | Status: DC
  Administered 2013-03-30 – 2013-03-31 (×2): 50 mg via ORAL
  Filled 2013-03-30 (×2): qty 3

## 2013-03-30 MED ORDER — DONEPEZIL HCL 5 MG PO TABS
5.0000 mg | ORAL_TABLET | Freq: Every day | ORAL | Status: DC
  Administered 2013-03-30: 5 mg via ORAL
  Filled 2013-03-30: qty 1

## 2013-03-30 MED ORDER — ALBUTEROL SULFATE (2.5 MG/3ML) 0.083% IN NEBU: 2.5000 mg | INHALATION_SOLUTION | RESPIRATORY_TRACT | Status: DC | PRN

## 2013-03-30 MED ORDER — ACETAMINOPHEN 650 MG RE SUPP: 650.0000 mg | Freq: Four times a day (QID) | RECTAL | Status: DC | PRN

## 2013-03-30 MED ORDER — NITROGLYCERIN 0.4 MG SL SUBL: 0.4000 mg | SUBLINGUAL_TABLET | SUBLINGUAL | Status: DC | PRN

## 2013-03-30 MED ORDER — IPRATROPIUM-ALBUTEROL 0.5-2.5 (3) MG/3ML IN SOLN
3.0000 mL | Freq: Four times a day (QID) | RESPIRATORY_TRACT | Status: DC
  Administered 2013-03-30 – 2013-03-31 (×3): 3 mL via RESPIRATORY_TRACT
  Filled 2013-03-30 (×3): qty 3

## 2013-03-30 MED ORDER — MORPHINE SULFATE 2 MG/ML IJ SOLN: 2.0000 mg | INTRAMUSCULAR | Status: DC | PRN

## 2013-03-30 MED ORDER — ASPIRIN EC 325 MG PO TBEC
325.0000 mg | DELAYED_RELEASE_TABLET | Freq: Every day | ORAL | Status: DC
  Administered 2013-03-30 – 2013-03-31 (×2): 325 mg via ORAL
  Filled 2013-03-30 (×2): qty 1

## 2013-03-30 MED ORDER — SODIUM CHLORIDE 0.9 % IJ SOLN
3.0000 mL | INTRAMUSCULAR | Status: DC | PRN
Start: 2013-03-30 — End: 2013-03-31

## 2013-03-30 MED ORDER — ONDANSETRON HCL 4 MG PO TABS: 4.0000 mg | ORAL_TABLET | Freq: Four times a day (QID) | ORAL | Status: DC | PRN

## 2013-03-30 MED ORDER — CITALOPRAM HYDROBROMIDE 20 MG PO TABS
20.0000 mg | ORAL_TABLET | Freq: Every day | ORAL | Status: DC
  Administered 2013-03-30 – 2013-03-31 (×2): 20 mg via ORAL
  Filled 2013-03-30 (×2): qty 1

## 2013-03-30 MED ORDER — SODIUM CHLORIDE 0.9 % IV SOLN: 250.0000 mL | INTRAVENOUS | Status: DC | PRN

## 2013-03-30 MED ORDER — AMLODIPINE BESYLATE 5 MG PO TABS
10.0000 mg | ORAL_TABLET | Freq: Every day | ORAL | Status: DC
  Administered 2013-03-30 – 2013-03-31 (×2): 10 mg via ORAL
  Filled 2013-03-30 (×2): qty 2

## 2013-03-30 MED ORDER — ENOXAPARIN SODIUM 30 MG/0.3ML ~~LOC~~ SOLN
30.0000 mg | SUBCUTANEOUS | Status: DC
  Administered 2013-03-30: 30 mg via SUBCUTANEOUS
  Filled 2013-03-30: qty 0.3

## 2013-03-30 MED ORDER — IPRATROPIUM BROMIDE 0.02 % IN SOLN: 0.5000 mg | Freq: Four times a day (QID) | RESPIRATORY_TRACT | Status: DC

## 2013-03-30 MED ORDER — ACETAMINOPHEN 325 MG PO TABS
650.0000 mg | ORAL_TABLET | Freq: Once | ORAL | Status: AC
  Administered 2013-03-30: 650 mg via ORAL
  Filled 2013-03-30: qty 2

## 2013-03-30 MED ORDER — AMITRIPTYLINE HCL 25 MG PO TABS
25.0000 mg | ORAL_TABLET | Freq: Every day | ORAL | Status: DC
  Administered 2013-03-30: 25 mg via ORAL
  Filled 2013-03-30: qty 1

## 2013-03-30 MED ORDER — METHOCARBAMOL 500 MG PO TABS
500.0000 mg | ORAL_TABLET | Freq: Four times a day (QID) | ORAL | Status: DC
  Administered 2013-03-30 – 2013-03-31 (×4): 500 mg via ORAL
  Filled 2013-03-30 (×4): qty 1

## 2013-03-30 MED ORDER — ONDANSETRON HCL 4 MG/2ML IJ SOLN: 4.0000 mg | Freq: Four times a day (QID) | INTRAMUSCULAR | Status: DC | PRN

## 2013-03-30 MED ORDER — PANTOPRAZOLE SODIUM 40 MG PO TBEC
40.0000 mg | DELAYED_RELEASE_TABLET | Freq: Every day | ORAL | Status: DC
  Administered 2013-03-31: 40 mg via ORAL
  Filled 2013-03-30 (×2): qty 1

## 2013-03-30 MED ORDER — OXYCODONE HCL 5 MG PO TABS
5.0000 mg | ORAL_TABLET | Freq: Three times a day (TID) | ORAL | Status: DC | PRN
  Administered 2013-03-31: 5 mg via ORAL
  Filled 2013-03-30: qty 1

## 2013-03-30 MED ORDER — MORPHINE SULFATE 2 MG/ML IJ SOLN
2.0000 mg | INTRAMUSCULAR | Status: DC | PRN
  Administered 2013-03-30: 2 mg via INTRAVENOUS
  Filled 2013-03-30: qty 1

## 2013-03-30 MED ORDER — LABETALOL HCL 200 MG PO TABS
100.0000 mg | ORAL_TABLET | Freq: Once | ORAL | Status: AC
  Administered 2013-03-30: 100 mg via ORAL
  Filled 2013-03-30: qty 1

## 2013-03-30 MED ORDER — SODIUM CHLORIDE 0.9 % IJ SOLN
3.0000 mL | Freq: Two times a day (BID) | INTRAMUSCULAR | Status: DC
  Administered 2013-03-30 – 2013-03-31 (×2): 3 mL via INTRAVENOUS

## 2013-03-30 MED ORDER — ALBUTEROL SULFATE (2.5 MG/3ML) 0.083% IN NEBU: 2.5000 mg | INHALATION_SOLUTION | Freq: Four times a day (QID) | RESPIRATORY_TRACT | Status: DC

## 2013-03-30 MED ORDER — RISPERIDONE 1 MG PO TABS
1.0000 mg | ORAL_TABLET | Freq: Every day | ORAL | Status: DC
  Administered 2013-03-30: 1 mg via ORAL
  Filled 2013-03-30: qty 1

## 2013-03-30 MED ORDER — ATORVASTATIN CALCIUM 40 MG PO TABS
40.0000 mg | ORAL_TABLET | Freq: Every day | ORAL | Status: DC
  Administered 2013-03-30: 40 mg via ORAL
  Filled 2013-03-30: qty 1

## 2013-03-30 MED ORDER — ALPRAZOLAM 0.5 MG PO TABS
0.5000 mg | ORAL_TABLET | Freq: Two times a day (BID) | ORAL | Status: DC | PRN
  Administered 2013-03-30: 0.5 mg via ORAL
  Filled 2013-03-30: qty 1

## 2013-03-30 MED ORDER — ACETAMINOPHEN 325 MG PO TABS
650.0000 mg | ORAL_TABLET | Freq: Four times a day (QID) | ORAL | Status: DC | PRN
Start: 2013-03-30 — End: 2013-03-31
  Administered 2013-03-31: 650 mg via ORAL
  Filled 2013-03-30: qty 2

## 2013-03-30 MED ORDER — SODIUM CHLORIDE 0.9 % IJ SOLN
3.0000 mL | Freq: Two times a day (BID) | INTRAMUSCULAR | Status: DC
  Administered 2013-03-30: 3 mL via INTRAVENOUS

## 2013-03-30 MED ORDER — BIOTENE DRY MOUTH MT LIQD
15.0000 mL | Freq: Two times a day (BID) | OROMUCOSAL | Status: DC
  Administered 2013-03-30 – 2013-03-31 (×2): 15 mL via OROMUCOSAL

## 2013-03-30 NOTE — ED Notes (Signed)
Pt c/o cp while at dialysis today. Pt had 3 sl nitro, 324 asa pta. Denies cp upon arrival.

## 2013-03-30 NOTE — ED Provider Notes (Signed)
CSN: [id number]     Arrival date & time 03/30/13  1009 History   This chart was scribed for Sharyon Cable, MD by Era Bumpers, ED scribe. This patient was seen in room APA05/AP[facility]05 and the patient's care was started at 1009.  Chief Complaint  Patient presents with  . Chest Pain   The history is provided by the patient. No language interpreter was used.   HPI Comments: [name] is a 73 y.o. female who presents to the Emergency Department for CP which radiates to her back which began while she was at dialysis today (she did not finish dialysis), she states her CP has improved somewhat since onset. She denies any CP prior to dialysis today or within the past few days.  she reports associated nausea. She took x3 SL NTG and 324 mg ASA. She denies CP w/deep breathing. She denies any Sob or emesis. She is not on O2 at home. She denies any hx of heart problems.   Her PCP is Dr. Willey Blade She does not smoke  Past Medical History  Diagnosis Date  . Hypertension   . Hypercholesterolemia   . Anemia   . GERD (gastroesophageal reflux disease)   . Asthma   . Osteoporosis   . Chronic kidney disease   . Anemia of chronic disease 08/07/2007  . Renal insufficiency 08/03/2010  . OSTEOPOROSIS 08/07/2007  . Peripheral neuropathy 08/03/2010  . Schizoaffective disorder   . Arthritis   . Pneumonia    Past Surgical History  Procedure Laterality Date  . Tubal ligation    . Breast biopsy    . Refractive surgery      right eye  . Bone marrow aspiration    . Bone marrow biopsy    . Insertion of dialysis catheter  01/23/2011    Procedure: INSERTION OF DIALYSIS CATHETER;  Surgeon: Angelia Mould, MD;  Location: [place];  Service: Vascular;  Laterality: N/A;  Insertion of Dialysis Catheter- Right Internal Jugular  . Yag laser application  05/39/7673    Procedure: YAG LASER APPLICATION;  Surgeon: Elta Guadeloupe T. Gershon Crane, MD;  Location: [facility];  Service: Ophthalmology;  Laterality: Left;   Family History   Problem Relation Age of Onset  . Alcohol abuse Brother   . Bipolar disorder Brother   . Dementia Mother   . Depression Mother   . ADD / ADHD Neg Hx   . Anxiety disorder Neg Hx   . Drug abuse Neg Hx   . OCD Neg Hx   . Paranoid behavior Neg Hx   . Schizophrenia Neg Hx   . Seizures Neg Hx   . Physical abuse Neg Hx   . Sexual abuse Neg Hx   . Healthy Daughter   . Healthy Daughter   . Healthy Daughter    History  Substance Use Topics  . Smoking status: Former Research scientist (life sciences)  . Smokeless tobacco: Not on file  . Alcohol Use: No   OB History   Grav Para Term Preterm Abortions TAB SAB Ect Mult Living                 Review of Systems  Constitutional: Negative for fever and chills.  Respiratory: Negative for cough and shortness of breath.   Cardiovascular: Positive for chest pain.  Gastrointestinal: Negative for abdominal pain.  Musculoskeletal: Negative for back pain.  All other systems reviewed and are negative.   Allergies  Neurontin; Ativan; Cymbalta; and Ferrous gluconate  Home Medications   Current Outpatient  Rx  Name  Route  Sig  Dispense  Refill  . ALPRAZolam (XANAX) 0.5 MG tablet      2 a day, 3 a day on dialysis days   90 tablet   2   . amitriptyline (ELAVIL) 25 MG tablet   Oral   Take 1 tablet (25 mg total) by mouth at bedtime.   30 tablet   2   . EXPIRED: amLODipine (NORVASC) 10 MG tablet   Oral   Take 1 tablet (10 mg total) by mouth daily.   30 tablet   12   . b complex-vitamin c-folic acid (NEPHRO-VITE) 0.8 MG TABS   Oral   Take 0.8 mg by mouth daily.         . citalopram (CELEXA) 20 MG tablet   Oral   Take 1 tablet (20 mg total) by mouth daily.   30 tablet   2   . donepezil (ARICEPT) 5 MG tablet   Oral   Take 5 mg by mouth at bedtime.          Marland Kitchen ipratropium (ATROVENT) 0.02 % nebulizer solution   Nebulization   Take 500 mcg by nebulization 4 (four) times daily as needed. Shortness of Breath         . labetalol (NORMODYNE) 100 MG  tablet   Oral   Take 200 mg by mouth 2 (two) times daily.          Marland Kitchen lidocaine (LIDODERM) 5 %   Transdermal   Place 1 patch onto the skin every 12 (twelve) hours. Remove & Discard patch within 12 hours or as directed by MD, use TAPE on all four sides.   30 patch   2   . methocarbamol (ROBAXIN) 500 MG tablet      TAKE (1) TABLET BY MOUTH FOUR TIMES DAILY.   120 tablet   3     AND AGAIN have PCP renew this in the future as thi ...   . oxyCODONE (OXY IR/ROXICODONE) 5 MG immediate release tablet   Oral   Take 5 mg by mouth 3 (three) times daily as needed. pain         . pantoprazole (PROTONIX) 40 MG tablet   Oral   Take 1 tablet (40 mg total) by mouth daily.   30 tablet   12   . risperiDONE (RISPERDAL) 0.5 MG tablet   Oral   Take 2 tablets (1 mg total) by mouth at bedtime.   60 tablet   2   . simvastatin (ZOCOR) 20 MG tablet   Oral   Take 20 mg by mouth every evening.         . torsemide (DEMADEX) 100 MG tablet   Oral   Take 50 mg by mouth daily.          . VOLTAREN 1 % GEL      APPLY (2) GRAMS TO AFFECTED AREA(S) 4 TIMES DAILY.   100 g   2     ALSO HAVE PCP renew these as this will not be rene ...    Triage Vitals: Pulse 125  Temp(Src) 98.2 F (36.8 C)  Resp 18  Ht _0  (1.499 m)  Wt 223 lb (101.152 kg)  BMI 45.02 kg/m2  SpO2 96%  Physical Exam CONSTITUTIONAL: Well developed/well nourished HEAD: Normocephalic/atraumatic EYES: EOMI/PERRL ENMT: Mucous membranes moist NECK: supple no meningeal signs SPINE:entire spine nontender CV: S1/S2 noted, no murmurs/rubs/gallops noted tachycardia.  LUNGS: no apparent distress. Scattered wheezing bilaterally  but no distress.  ABDOMEN: soft, nontender, no rebound or guarding GU:no cva tenderness NEURO: Pt is awake/alert, moves all extremitiesx4 EXTREMITIES: pulses normal, full ROM. Dialysis access to left arm w/thrill noted.  SKIN: warm, color normal PSYCH: no abnormalities of mood noted  ED Course   Procedures  DIAGNOSTIC STUDIES: Oxygen Saturation is 96% on room air, adequate by my interpretation.    COORDINATION OF CARE: At 1040 AM Discussed treatment plan with patient which includes EKG, CXR, blood work, cardiac enzymes. Patient agrees.   1140 AM Recheck: Pt comfortable lying in her bed, denies current CP. Discussed plan to admit pt to the hospital; she is agreeable and understands.  Vitals improved Denied pleuritic pain, doubt Pe Will admit to [facility] for chest pain evaluation and will need dialysis as inpatient Pt agreeable  Labs Review Labs Reviewed  CBC WITH DIFFERENTIAL - Abnormal; Notable for the following:    RBC 3.59 (*)    Hemoglobin 10.9 (*)    HCT 35.4 (*)    Eosinophils Relative 7 (*)    All other components within normal limits  BASIC METABOLIC PANEL  I-STAT TROPOININ, ED   Imaging Review Dg Chest Portable 1 View  03/30/2013   CLINICAL DATA:  Chest pain  EXAM: PORTABLE CHEST - 1 VIEW  COMPARISON:  04/19/2012  FINDINGS: Cardiac shadow is within normal limits. Mild vascular congestion is seen. No focal infiltrate is noted. Mild atelectasis is noted in the left mid lung.  IMPRESSION: Mild vascular congestion.  Mild left lung atelectasis.   Electronically Signed   By: Inez Catalina M.D.   On: 03/30/2013 10:34     EKG Interpretation   Date/Time:  Monday March 30 2013 10:31:56 EDT Ventricular Rate:  117 PR Interval:  176 QRS Duration: 102 QT Interval:  360 QTC Calculation: 502 R Axis:   -78 Text Interpretation:  Sinus tachycardia Left axis deviation Septal infarct  (cited on or before 18-Dec-2007) Abnormal ECG When compared with ECG of  19-Apr-2012 02:04, Vent. rate has increased BY  59 BPM T wave inversion no  longer evident in Inferior leads Nonspecific T wave abnormality no longer  evident in Anterolateral leads Confirmed by Christy Gentles  MD, Elenore Rota ([id number])  on 03/30/2013 10:41:08 AM      MDM   Final diagnoses:  Chest pain  Renal failure    Nursing  notes including past medical history and social history reviewed and considered in documentation xrays reviewed and considered Labs/vital reviewed and considered   I personally performed the services described in this documentation, which was scribed in my presence. The recorded information has been reviewed and is accurate.      Sharyon Cable, MD 03/30/13 1332

## 2013-03-30 NOTE — H&P (Signed)
[facility] History and Physical  SHAYLEY MEDLIN HUD:[id number] DOB: [birth year] DOA: 03/30/2013  Referring physician: Dr. Christy Gentles, ER physician PCP: Asencion Noble, MD   Chief Complaint: Chest pain  HPI: [name] is a 73 y.o. female who was at dialysis today when she started to develop substernal chest pain. She describes this as a sharp sensation which lasts several minutes and resolved after her third nitroglycerin. She feels some pain in her arm as well as her neck, but feels that this may also be related to her arthritis. She did not have any associated shortness of breath. She did feel nauseous, but denies diaphoresis. She does not have any lightheadedness. She reports her symptoms as intermittent and are not related to any exertion. She has had more episodes since the first one. She became tachycardic and was sent from dialysis to the emergency room for evaluation. She does not have any pain at this time. Patient does not appear to be positional. Her last stress test was approximately 12 years ago when she had chest pain at that time. She does not report any further cardiac workup since then. She's not had any fever or cough. She has COPD but reports that her breathing is near baseline. She's not had any vomiting or diarrhea. She'll be admitted for further treatments.   Review of Systems:  Pertinent positives as per history of present illness, otherwise negative  Past Medical History  Diagnosis Date  . Hypertension   . Hypercholesterolemia   . Anemia   . GERD (gastroesophageal reflux disease)   . Asthma   . Osteoporosis   . Chronic kidney disease   . Anemia of chronic disease 08/07/2007  . Renal insufficiency 08/03/2010  . OSTEOPOROSIS 08/07/2007  . Peripheral neuropathy 08/03/2010  . Schizoaffective disorder   . Arthritis   . Pneumonia    Past Surgical History  Procedure Laterality Date  . Tubal ligation    . Breast biopsy    . Refractive surgery      right eye  .  Bone marrow aspiration    . Bone marrow biopsy    . Insertion of dialysis catheter  01/23/2011    Procedure: INSERTION OF DIALYSIS CATHETER;  Surgeon: Angelia Mould, MD;  Location: [facility];  Service: Vascular;  Laterality: N/A;  Insertion of Dialysis Catheter- Right Internal Jugular  . Yag laser application  85/88/5027    Procedure: YAG LASER APPLICATION;  Surgeon: Elta Guadeloupe T. Gershon Crane, MD;  Location: [facility];  Service: Ophthalmology;  Laterality: Left;   Social History:  reports that she has quit smoking. She does not have any smokeless tobacco history on file. She reports that she does not drink alcohol or use illicit drugs.  Allergies  Allergen Reactions  . Neurontin [Gabapentin] Other (See Comments)    Had about every known side effect with $RemoveBef'300mg'sZrxjokeIT$  three a day on this. DO NOT TRY THIS AGAIN!  . Ativan [Lorazepam] Nausea Only and Other (See Comments)    Disoriented, not feel like herself   . Cymbalta [Duloxetine Hcl] Other (See Comments)    Noted hallucinations this, but her primary diagnosis is schizophrenia   . Ferrous Gluconate Nausea Only    hypotension    Family History  Problem Relation Age of Onset  . Alcohol abuse Brother   . Bipolar disorder Brother   . Dementia Mother   . Depression Mother   . ADD / ADHD Neg Hx   . Anxiety disorder Neg Hx   . Drug abuse  Neg Hx   . OCD Neg Hx   . Paranoid behavior Neg Hx   . Schizophrenia Neg Hx   . Seizures Neg Hx   . Physical abuse Neg Hx   . Sexual abuse Neg Hx   . Healthy Daughter   . Healthy Daughter   . Healthy Daughter      Prior to Admission medications   Medication Sig Start Date End Date Taking? Authorizing Provider  ALPRAZolam Duanne Moron) 0.5 MG tablet 2 a day, 3 a day on dialysis days 03/19/13  Yes Levonne Spiller, MD  amitriptyline (ELAVIL) 25 MG tablet Take 1 tablet (25 mg total) by mouth at bedtime. 03/19/13  Yes Levonne Spiller, MD  amLODipine (NORVASC) 10 MG tablet Take 1 tablet (10 mg total) by mouth daily. 01/24/11 03/30/13 Yes  Asencion Noble, MD  atorvastatin (LIPITOR) 40 MG tablet Take 1 tablet by mouth daily. 03/06/13  Yes Historical Provider, MD  citalopram (CELEXA) 20 MG tablet Take 1 tablet (20 mg total) by mouth daily. 03/19/13  Yes Levonne Spiller, MD  donepezil (ARICEPT) 5 MG tablet Take 5 mg by mouth at bedtime.  03/21/11  Yes Historical Provider, MD  ipratropium (ATROVENT) 0.02 % nebulizer solution Take 500 mcg by nebulization 4 (four) times daily as needed. Shortness of Breath 11/16/11  Yes Janice Norrie, MD  labetalol (NORMODYNE) 100 MG tablet Take 100 mg by mouth once.    Yes Historical Provider, MD  methocarbamol (ROBAXIN) 500 MG tablet TAKE (1) TABLET BY MOUTH FOUR TIMES DAILY. 07/10/12  Yes Darrol Jump, MD  oxyCODONE (OXY IR/ROXICODONE) 5 MG immediate release tablet Take 5 mg by mouth 3 (three) times daily as needed. pain 08/21/11  Yes Historical Provider, MD  pantoprazole (PROTONIX) 40 MG tablet Take 1 tablet (40 mg total) by mouth daily. 04/21/12  Yes Asencion Noble, MD  risperiDONE (RISPERDAL) 0.5 MG tablet Take 2 tablets (1 mg total) by mouth at bedtime. 03/19/13  Yes Levonne Spiller, MD  Sevelamer HCl (RENAGEL PO) Take 1 capsule by mouth daily.   Yes Historical Provider, MD  torsemide (DEMADEX) 100 MG tablet Take 50 mg by mouth daily.  01/24/11  Yes Asencion Noble, MD   Physical Exam: Filed Vitals:   03/30/13 1512  BP: 139/81  Pulse: 65  Temp: 97.5 F (36.4 C)  Resp: 20    BP 139/81  Pulse 65  Temp(Src) 97.5 F (36.4 C) (Oral)  Resp 20  Ht $R'4\' 11"'wo$  (1.499 m)  Wt 103.6 kg (228 lb 6.3 oz)  BMI 46.11 kg/m2  SpO2 100%  General:  Appears calm and comfortable Eyes: PERRL, normal lids, irises & conjunctiva ENT: grossly normal hearing, lips & tongue Neck: no LAD, masses or thyromegaly Cardiovascular: RRR, no m/r/g. trace LE edema. Telemetry: SR, no arrhythmias  Respiratory: diminished breath sounds with mild exp wheeze bilaterally Abdomen: soft, nt nd, BS+ Skin: no rash or induration seen on limited exam Musculoskeletal:  grossly normal tone BUE/BLE Psychiatric: grossly normal mood and affect, speech fluent and appropriate Neurologic: grossly non-focal.          Labs on Admission:  Basic Metabolic Panel:  Recent Labs Lab 03/30/13 1021  NA 138  K 4.3  CL 94*  CO2 30  GLUCOSE 109*  BUN 16  CREATININE 6.38*  CALCIUM 7.7*   Liver Function Tests: No results found for this basename: AST, ALT, ALKPHOS, BILITOT, PROT, ALBUMIN,  in the last 168 hours No results found for this basename: LIPASE, AMYLASE,  in the last  168 hours No results found for this basename: AMMONIA,  in the last 168 hours CBC:  Recent Labs Lab 03/30/13 1021  WBC 7.2  NEUTROABS 5.3  HGB 10.9*  HCT 35.4*  MCV 98.6  PLT 198   Cardiac Enzymes: No results found for this basename: CKTOTAL, CKMB, CKMBINDEX, TROPONINI,  in the last 168 hours  BNP (last 3 results)  Recent Labs  04/19/12 0053  PROBNP 16031.0*   CBG: No results found for this basename: GLUCAP,  in the last 168 hours  Radiological Exams on Admission: Dg Chest Portable 1 View  03/30/2013   CLINICAL DATA:  Chest pain  EXAM: PORTABLE CHEST - 1 VIEW  COMPARISON:  04/19/2012  FINDINGS: Cardiac shadow is within normal limits. Mild vascular congestion is seen. No focal infiltrate is noted. Mild atelectasis is noted in the left mid lung.  IMPRESSION: Mild vascular congestion.  Mild left lung atelectasis.   Electronically Signed   By: Inez Catalina M.D.   On: 03/30/2013 10:34    EKG: Independently reviewed. Sinus tachycardia  Assessment/Plan Active Problems:   Anemia of chronic disease   Anxiety and depression   HYPERTENSION   GERD   Schizoaffective disorder   Chronic back pain greater than 3 months duration   Chest pain   COPD (chronic obstructive pulmonary disease)   ESRD on dialysis   Morbid obesity   1. Chest pain. Etiology is not clear. She certainly has risk factors for coronary disease. We'll cycle her cardiac markers. Monitor on telemetry and repeat  EKG in the morning. We will check 2-D echocardiogram. Consult cardiology in the morning. Keep n.p.o. after midnight in the event the stress test will be pursued tomorrow. 2. End-stage renal disease on hemodialysis. Chest x-ray indicated some volume overload. Nephrology will be consulted. 3. COPD. Patient does have mild wheezing. We will start her on nebulizer treatments. 4. Chronic pain. Continue her home regimen. 5. Anxiety and depression. Continue Xanax. 6. Schizoaffective disorder. Continue Risperdal. 7. GERD. Continue PPI.   Code Status: full code Family Communication: discussed with patient and family Disposition Plan: discharge home once improved  Time spent: 96mins  Mionna Advincula [facility] Pager [phone]

## 2013-03-31 ENCOUNTER — Observation Stay (HOSPITAL_COMMUNITY): Payer: Medicare Other

## 2013-03-31 ENCOUNTER — Encounter (HOSPITAL_COMMUNITY): Payer: Medicare Other

## 2013-03-31 ENCOUNTER — Encounter (HOSPITAL_COMMUNITY): Payer: Self-pay | Admitting: Adult Health

## 2013-03-31 ENCOUNTER — Encounter (HOSPITAL_COMMUNITY): Payer: Self-pay

## 2013-03-31 DIAGNOSIS — I10 Essential (primary) hypertension: Secondary | ICD-10-CM

## 2013-03-31 DIAGNOSIS — R079 Chest pain, unspecified: Secondary | ICD-10-CM

## 2013-03-31 LAB — TROPONIN I: Troponin I: <0.3 ng/mL (ref <0.30)

## 2013-03-31 LAB — BASIC METABOLIC PANEL
BUN: 26 mg/dL — AB (ref 6–23)
CHLORIDE: 94 meq/L — AB (ref 96–112)
CO2: 28 mEq/L (ref 19–32)
Calcium: 7.7 mg/dL — ABNORMAL LOW (ref 8.4–10.5)
Creatinine, Ser: 8.68 mg/dL — ABNORMAL HIGH (ref 0.50–1.10)
GFR calc Af Amer: 5 mL/min — ABNORMAL LOW (ref >90)
GFR, EST NON AFRICAN AMERICAN: 4 mL/min — AB (ref >90)
Glucose, Bld: 87 mg/dL (ref 70–99)
POTASSIUM: 5.3 meq/L (ref 3.7–5.3)
SODIUM: 138 meq/L (ref 137–147)

## 2013-03-31 LAB — CBC
HEMATOCRIT: 34.2 % — AB (ref 36.0–46.0)
HEMOGLOBIN: 10.5 g/dL — AB (ref 12.0–15.0)
MCH: 30.3 pg (ref 26.0–34.0)
MCHC: 30.7 g/dL (ref 30.0–36.0)
MCV: 98.8 fL (ref 78.0–100.0)
Platelets: 174 10*3/uL (ref 150–400)
RBC: 3.46 MIL/uL — AB (ref 3.87–5.11)
RDW: 14.9 % (ref 11.5–15.5)
WBC: 6.9 10*3/uL (ref 4.0–10.5)

## 2013-03-31 LAB — TSH: TSH: 3.544 u[IU]/mL (ref 0.350–4.500)

## 2013-03-31 MED ORDER — TECHNETIUM TC 99M SESTAMIBI - CARDIOLITE
30.0000 | Freq: Once | INTRAVENOUS | Status: AC | PRN
  Administered 2013-03-31: 30 via INTRAVENOUS

## 2013-03-31 MED ORDER — TECHNETIUM TC 99M SESTAMIBI GENERIC - CARDIOLITE
10.0000 | Freq: Once | INTRAVENOUS | Status: AC | PRN
  Administered 2013-03-31: 9 via INTRAVENOUS

## 2013-03-31 MED ORDER — REGADENOSON 0.4 MG/5ML IV SOLN
INTRAVENOUS | Status: AC
  Administered 2013-03-31: 0.4 mg via INTRAVENOUS
  Filled 2013-03-31: qty 5

## 2013-03-31 MED ORDER — REGADENOSON 0.4 MG/5ML IV SOLN
0.4000 mg | Freq: Once | INTRAVENOUS | Status: DC
  Filled 2013-03-31: qty 5

## 2013-03-31 MED ORDER — SODIUM CHLORIDE 0.9 % IJ SOLN
INTRAMUSCULAR | Status: AC
  Administered 2013-03-31: 10 mL via INTRAVENOUS
  Filled 2013-03-31: qty 10

## 2013-03-31 NOTE — Consult Note (Signed)
[name] MRN: [id number] DOB/AGE: 05-28-1940 73 y.o. Primary Care Physician:FAGAN,ROY, MD Admit date: 03/30/2013 Chief Complaint:  Chief Complaint  Patient presents with  . Chest Pain   HPI: Pt is 73 year old female with past medical hx of ESRD who presented to ER with c/o chest pain.  HPI dates back to yesterday when pt was on HD and stated having chest pain. Pt presented to ER and was admitted to r/o MI. Pt today feels better. NO c/o dyspnea No c/o cough/fever/chills  NO c/o nausea/ vomiting NO c/o change in speech orvision  no c/o syncope Pt is on MWF schedule but was dialyzed for 180 mins and missed 30 mins only yesterday.    Past Medical History  Diagnosis Date  . Essential hypertension, benign   . Hypercholesterolemia   . GERD (gastroesophageal reflux disease)   . Asthma   . Osteoporosis   . ESRD (end stage renal disease) on dialysis   . Anemia of chronic disease   . Peripheral neuropathy   . Schizoaffective disorder   . Arthritis   . History of pneumonia         Family History  Problem Relation Age of Onset  . Alcohol abuse Brother   . Bipolar disorder Brother   . Dementia Mother   . Depression Mother   . ADD / ADHD Neg Hx   . Anxiety disorder Neg Hx   . Drug abuse Neg Hx   . OCD Neg Hx   . Paranoid behavior Neg Hx   . Schizophrenia Neg Hx   . Seizures Neg Hx   . Physical abuse Neg Hx   . Sexual abuse Neg Hx   . Healthy Daughter   . Healthy Daughter   . Healthy Daughter     Social History:  reports that she has quit smoking. Her smoking use included Cigarettes. She smoked 0.00 packs per day. She does not have any smokeless tobacco history on file. She reports that she does not drink alcohol or use illicit drugs.   Allergies:  Allergies  Allergen Reactions  . Neurontin [Gabapentin] Other (See Comments)    Had about every known side effect with 300mg  three a day on this. DO NOT TRY THIS AGAIN!  . Ativan [Lorazepam] Nausea Only and Other  (See Comments)    Disoriented, not feel like herself   . Cymbalta [Duloxetine Hcl] Other (See Comments)    Noted hallucinations this, but her primary diagnosis is schizophrenia   . Ferrous Gluconate Nausea Only    hypotension    Medications Prior to Admission  Medication Sig Dispense Refill  . ALPRAZolam (XANAX) 0.5 MG tablet 2 a day, 3 a day on dialysis days  90 tablet  2  . amitriptyline (ELAVIL) 25 MG tablet Take 1 tablet (25 mg total) by mouth at bedtime.  30 tablet  2  . amLODipine (NORVASC) 10 MG tablet Take 1 tablet (10 mg total) by mouth daily.  30 tablet  12  . atorvastatin (LIPITOR) 40 MG tablet Take 1 tablet by mouth daily.      . citalopram (CELEXA) 20 MG tablet Take 1 tablet (20 mg total) by mouth daily.  30 tablet  2  . donepezil (ARICEPT) 5 MG tablet Take 5 mg by mouth at bedtime.       Marland Kitchen. ipratropium (ATROVENT) 0.02 % nebulizer solution Take 500 mcg by nebulization 4 (four) times daily as needed. Shortness of Breath      . labetalol (NORMODYNE) 100  MG tablet Take 100 mg by mouth once.       . methocarbamol (ROBAXIN) 500 MG tablet TAKE (1) TABLET BY MOUTH FOUR TIMES DAILY.  120 tablet  3  . oxyCODONE (OXY IR/ROXICODONE) 5 MG immediate release tablet Take 5 mg by mouth 3 (three) times daily as needed. pain      . pantoprazole (PROTONIX) 40 MG tablet Take 1 tablet (40 mg total) by mouth daily.  30 tablet  12  . risperiDONE (RISPERDAL) 0.5 MG tablet Take 2 tablets (1 mg total) by mouth at bedtime.  60 tablet  2  . Sevelamer HCl (RENAGEL PO) Take 1 capsule by mouth daily.      Marland Kitchen torsemide (DEMADEX) 100 MG tablet Take 50 mg by mouth daily.            ZOX:WRUEA from the symptoms mentioned above,there are no other symptoms referable to all systems reviewed.  Marland Kitchen amitriptyline  25 mg Oral QHS  . amLODipine  10 mg Oral Daily  . antiseptic oral rinse  15 mL Mouth Rinse BID  . aspirin EC  325 mg Oral Daily  . atorvastatin  40 mg Oral q1800  . citalopram  20 mg Oral Daily  .  donepezil  5 mg Oral QHS  . enoxaparin (LOVENOX) injection  30 mg Subcutaneous Q24H  . methocarbamol  500 mg Oral QID  . pantoprazole  40 mg Oral Daily  . regadenoson  0.4 mg Intravenous Once  . risperiDONE  1 mg Oral QHS  . sodium chloride  3 mL Intravenous Q12H  . sodium chloride  3 mL Intravenous Q12H  . torsemide  50 mg Oral Daily     Physical Exam: Vital signs in last 24 hours: Temp:  [97.5 F (36.4 C)-98.4 F (36.9 C)] 97.7 F (36.5 C) (03/17 0512) Pulse Rate:  [63-125] 63 (03/17 0803) Resp:  [18-20] 18 (03/17 0803) BP: (103-139)/(48-93) 135/48 mmHg (03/17 0512) SpO2:  [94 %-100 %] 100 % (03/17 0803) FiO2 (%):  [99 %] 99 % (03/17 0303) Weight:  [223 lb (101.152 kg)-229 lb 3.2 oz (103.964 kg)] 229 lb 3.2 oz (103.964 kg) (03/17 5409) Weight change:  Last BM Date: 03/28/13  Intake/Output from previous day:   Total I/O In: 3 [I.V.:3] Out: -    Physical Exam: General- pt is awake,alert, oriented to time place and person Resp- No acute REsp distress, CTA B/L NO Rhonchi CVS- S1S2 regular in rate and rhythm GIT- BS+, soft, NT, ND EXT- NO LE Edema, Cyanosis CNS- CN 2-12 grossly intact. Moving all 4 extremities Psych- normal mood and affect Access- PC / AVF/ AVG   Lab Results: CBC  Recent Labs  03/30/13 1021 03/31/13 0634  WBC 7.2 6.9  HGB 10.9* 10.5*  HCT 35.4* 34.2*  PLT 198 174    BMET  Recent Labs  03/30/13 1021 03/31/13 0634  NA 138 138  K 4.3 5.3  CL 94* 94*  CO2 30 28  GLUCOSE 109* 87  BUN 16 26*  CREATININE 6.38* 8.68*  CALCIUM 7.7* 7.7*    MICRO No results found for this or any previous visit (from the past 240 hour(s)).    Lab Results  Component Value Date   PTH 1500.0* 01/17/2011   CALCIUM 7.7* 03/31/2013   PHOS 3.3 04/20/2012      Impression: 1)Renal ESRD on HD ON MWF schedule as outpt   2)HTN Target Organ damage  CKD Medication- On Diuretics- On Calcium Channel Blockers On Alpha and beta Blocker-  as outpt    3)Anemia HGb at goal (9--11) On Epo as outpt.  4)CKD Mineral-Bone Disorder PTH elevated. Secondary Hyperparathyroidism  Present. Phosphorus at goal.   5)[facility]-admitt[facility] with Chest pain Pt has been  ruled out Primary MD following  6)Electrolytes Normokalemic NOrmonatremic   7)Acid base Co2 at goal     Plan:  Agree with current tx and plan Pt being  D/ced home and will follow up at her outpt Hd center in am.       BHUTANI,MANPREET S 03/31/2013, 9:28 AM

## 2013-03-31 NOTE — Progress Notes (Signed)
Stress Lab Nurses Notes - [name]  [name] 03/31/2013 Reason for doing test: Chest Pain Type of test: Marlane HatcherLexiscan Cardiolite / Inpatient Nurse performing test: Heather PoissonPhyllis Billingsly, RN Nuclear Medicine Tech: [name] Echo Tech: Not Applicable MD performing test: [name] /[name] Family MD: [name] Test explained and consent signed: yes IV started: 20g jelco, Saline lock flushed, No redness or edema and Saline lock from floor Symptoms: Chest pain & stomach discomfort Treatment/Intervention: None Reason test stopped: protocol completed After recovery IV was: No redness or edema and Saline Lock flushed Patient to return to Nuc. Med at : 11:15 Patient discharged: Transported back to room 331 via wc Patient's Condition upon discharge was: stable Comments: During test BP 155/71 & HR 93.  Recovery BP 135/68 & HR 79.  Symptoms resolved in recovery. [name], [name]

## 2013-03-31 NOTE — Progress Notes (Signed)
[name]:  [name], Heather               ACCOUNT NO.:  [id number]  MEDICAL RECORD NO.:  [id number]  LOCATION:                                 FACILITY:  PHYSICIAN:  Kingsley Callanderoy O. Ouida SillsFagan, MD       DATE OF BIRTH:  [birth year]  DATE OF PROCEDURE:  03/31/2013 DATE OF DISCHARGE:                                PROGRESS NOTE   SUBJECTIVE:  [name] was admitted by the hospitalist yesterday after experiencing chest pain on dialysis.  She describes substernal chest pain radiating to the left side of her neck.  She had nausea, but did not vomit.  She denies diaphoresis or shortness of breath.  Her troponins have been negative.  She denies chest pain now.  OBJECTIVE:  VITAL SIGNS:  Temperature 97.7, pulse 63, blood pressure 135/48. LUNGS:  Clear. HEART:  Regular with no murmurs. ABDOMEN:  Soft and nontender. EXTREMITIES:  No edema.  IMPRESSION/PLAN: 1. Chest pain.  Troponins have been consistently less than 0.30.  Her     initial EKG revealed sinus tachycardia at 117 beats per minute.     Her repeat EKG reveals normal sinus rhythm at 61 beats per minute.     She has new T-wave inversions inferiorly and in the anterolateral     leads.  She has consistent changes of prior septal infarct.  Her     chest x-ray reveals mild vascular congestion and left lung     atelectasis.  She is certainly at high-risk for coronary artery     disease with risk factors of hypertension and end-stage renal     failure.  Cardiology will be consulted. 2. End-stage renal disease.  BUN and creatinine are 26 and 8.68.  She     will be due for dialysis again tomorrow.  Potassium is 5.3, bicarb     is 28.  Hemoglobin is 10.5.     Kingsley Callanderoy O. Ouida SillsFagan, MD     ROF/MEDQ  D:  03/31/2013  T:  03/31/2013  Job:  [id number]

## 2013-03-31 NOTE — Consult Note (Signed)
CARDIOLOGY CONSULT NOTE   Patient ID: PAYTYN MESTA MRN: [id number] DOB/AGE: Dec 22, 1940 73 y.o.  Admit Date: 03/30/2013 Referring Physician: Asencion Noble Primary Physician: Asencion Noble, MD Consulting Cardiologist: Rozann Lesches MD Reason for Consultation: Chest pain with dialysis   Clinical Summary Ms. Ciotti is a 73 y.o.female with known history of ESRD, hypertension, hypercholesterolemia, and asthma.  She was admitted with chest pain that started during dialysis, requiring NTG  X 3 for relief. She had radiation to the neck and left arm. She states that she has had intermittent episodes of rest chest pain over time, also describes "gas." This episode was the most intense.    She states that heartburn and gas have been occuring since beginning dialysis for the last 2 years. She has been taking Tums, simethicone, and PPI prescribed by Dr.Befakadu. She also states during dialysis, she has had significant drops in BP, and is usually placed on oxygen for this. She has been advised by Dr. Hinda Lenis, not to take antihypertensives prior to dialysis.     On arrival to ER, BP was 103/70; Hgb of 10.9/Hct 35.4. potassium 4.3, creatinine 6.38. CXR demonstrated mild vascular congestion. EKG showed normal sinus rhythm with left axis deviation, incomplete right bundle branch block, and T-wave inversion in the infero/lateral leads. Cardiac markers are negative X 3.   She has not had any recurrent chest pain since admission. She admits to sedentary lifestyle with use of walker for ambulation, due to severe arthritis in her back.         Allergies  Allergen Reactions  . Neurontin [Gabapentin] Other (See Comments)    Had about every known side effect with $RemoveBef'300mg'VnTadHHkqo$  three a day on this. DO NOT TRY THIS AGAIN!  . Ativan [Lorazepam] Nausea Only and Other (See Comments)    Disoriented, not feel like herself   . Cymbalta [Duloxetine Hcl] Other (See Comments)    Noted hallucinations this, but her primary  diagnosis is schizophrenia   . Ferrous Gluconate Nausea Only    hypotension    Medications Scheduled Medications: . amitriptyline  25 mg Oral QHS  . amLODipine  10 mg Oral Daily  . antiseptic oral rinse  15 mL Mouth Rinse BID  . aspirin EC  325 mg Oral Daily  . atorvastatin  40 mg Oral q1800  . citalopram  20 mg Oral Daily  . donepezil  5 mg Oral QHS  . enoxaparin (LOVENOX) injection  30 mg Subcutaneous Q24H  . methocarbamol  500 mg Oral QID  . pantoprazole  40 mg Oral Daily  . regadenoson  0.4 mg Intravenous Once  . risperiDONE  1 mg Oral QHS  . sodium chloride  3 mL Intravenous Q12H  . sodium chloride  3 mL Intravenous Q12H  . torsemide  50 mg Oral Daily    PRN Medications: sodium chloride, acetaminophen, acetaminophen, albuterol, ALPRAZolam, morphine injection, nitroGLYCERIN, ondansetron (ZOFRAN) IV, ondansetron, oxyCODONE, sodium chloride, technetium sestamibi   Past Medical History  Diagnosis Date  . Essential hypertension, benign   . Hypercholesterolemia   . GERD (gastroesophageal reflux disease)   . Asthma   . Osteoporosis   . ESRD (end stage renal disease) on dialysis   . Anemia of chronic disease   . Peripheral neuropathy   . Schizoaffective disorder   . Arthritis   . History of pneumonia     Past Surgical History  Procedure Laterality Date  . Tubal ligation    . Breast biopsy    . Refractive surgery  Right eye  . Bone marrow aspiration    . Bone marrow biopsy    . Insertion of dialysis catheter  01/23/2011    Procedure: INSERTION OF DIALYSIS CATHETER;  Surgeon: Angelia Mould, MD;  Location: [facility];  Service: Vascular;  Laterality: N/A;  Insertion of Dialysis Catheter- Right Internal Jugular  . Yag laser application  83/38/2505    Procedure: YAG LASER APPLICATION;  Surgeon: Elta Guadeloupe T. Gershon Crane, MD;  Location: [facility];  Service: Ophthalmology;  Laterality: Left;    Family History  Problem Relation Age of Onset  . Alcohol abuse Brother   .  Bipolar disorder Brother   . Dementia Mother   . Depression Mother   . ADD / ADHD Neg Hx   . Anxiety disorder Neg Hx   . Drug abuse Neg Hx   . OCD Neg Hx   . Paranoid behavior Neg Hx   . Schizophrenia Neg Hx   . Seizures Neg Hx   . Physical abuse Neg Hx   . Sexual abuse Neg Hx   . Healthy Daughter   . Healthy Daughter   . Healthy Daughter     Social History Ms. Luhman reports that she has quit smoking. Her smoking use included Cigarettes. She smoked 0.00 packs per day. She does not have any smokeless tobacco history on file. Ms. Lahm reports that she does not drink alcohol.  Review of Systems Otherwise reviewed and negative except as outlined.  Physical Examination Blood pressure 135/48, pulse 63, temperature 97.7 F (36.5 C), temperature source Oral, resp. rate 18, height $RemoveBe'4\' 11"'KWImhjmMQ$  (1.499 m), weight 229 lb 3.2 oz (103.964 kg), SpO2 100.00%.  Intake/Output Summary (Last 24 hours) at 03/31/13 1014 Last data filed at 03/31/13 0907  Gross per 24 hour  Intake      3 ml  Output      0 ml  Net      3 ml    Telemetry: NSR rate of 65-70 bpm.   GEN: Morbidly obese, stable appearing female, in no acute distress.  HEENT: Conjunctiva and lids normal, oropharynx clear with moist mucosa. Neck: Supple, obese, no elevated JVP or carotid bruits, no thyromegaly. Lungs: Clear to auscultation, nonlabored breathing at rest. Wearing oxygen.  Cardiac: Regular rate and rhythm, soft systolic murmur, no pericardial rub. Abdomen: Soft, nontender, obese, no hepatomegaly, bowel sounds present, no guarding or rebound. Extremities: No pitting edema, distal pulses 2+. Skin: Warm and dry. Musculoskeletal: No kyphosis. Neuropsychiatric: Alert and oriented x3, affect grossly appropriate.   Prior Cardiac Testing/Procedures  Adenosine Cardiolite Study 2003 Negative per Dr. Ria Comment Note on discharge summary at that time.   Lab Results  Basic Metabolic Panel:  Recent Labs Lab 03/30/13 1021  03/31/13 0634  NA 138 138  K 4.3 5.3  CL 94* 94*  CO2 30 28  GLUCOSE 109* 87  BUN 16 26*  CREATININE 6.38* 8.68*  CALCIUM 7.7* 7.7*    CBC:  Recent Labs Lab 03/30/13 1021 03/31/13 0634  WBC 7.2 6.9  NEUTROABS 5.3  --   HGB 10.9* 10.5*  HCT 35.4* 34.2*  MCV 98.6 98.8  PLT 198 174    Cardiac Enzymes:  Recent Labs Lab 03/30/13 1750 03/31/13 0132 03/31/13 0634  TROPONINI <0.30 <0.30 <0.30    BNP: 2,732    Radiology: Dg Chest Portable 1 View  03/30/2013   CLINICAL DATA:  Chest pain  EXAM: PORTABLE CHEST - 1 VIEW  COMPARISON:  04/19/2012  FINDINGS: Cardiac shadow is within normal limits.  Mild vascular congestion is seen. No focal infiltrate is noted. Mild atelectasis is noted in the left mid lung.  IMPRESSION: Mild vascular congestion.  Mild left lung atelectasis.   Electronically Signed   By: Inez Catalina M.D.   On: 03/30/2013 10:34    ECG: NSR with new T-wave inversion in the infero/lateral leads. Compared to 04/2012.   Impression and Recommendations  1. Chest Pain: Typical and atypical features, described as sharp across the epigastric area, across chest, with some radiation to the left arm and neck. She has also been experiencing which she describes as gas and heartburn which occurs after dialysis. CVRF include, hypertension, obesity, hypercholesterolemia, age. ECG demonstrated new T-wave inversion in the infero/lateral leads compared to presenting tracing in sinus tachycardia, however she did have inferolateral ST-T wave abnormalities on more remote tracings. Cardiac markers are negative X 3, arguing against ACS. Will plan Lexiscan Cardiolite this am for further evaluation of ischemic etiology for chest discomfort. Continue ASA 81 mg daily.   2. Hypertension: Echocardiogram is planned today for evaluation of LV fx. She experiences hypotension associated with dialysis, and holds antihypertensives until afterwards. Due to body habitus, consider sleep study for OSA  contributing to some of the elevation.   3. Obesity: Consider obesity related hypoventiliation syndrome. She requires O2 currently. Pickwickian syndrome.  4. Hypercholesterolemia:  Most recent labs documented in Jan of 2013. TC 97, TG 100, HDL 37, LDL 40. Continue atorvastatin 40 mg as at home.    Signed: Phill Myron. Purcell Nails NP [facility] 03/31/2013, 10:14 AM Co-Sign MD   Attending note:  Patient seen and examined. Reviewed records and modified above noted by Ms. Lawrence NP. I met with Ms. Plake and her daughter this morning. She presents from hemodialysis via the ER for further evaluation of chest pain. She describes a sharp lower thoracic discomfort that began during dialysis, reportedly relieved with 3 sublingual nitroglycerin. She has had similar chest discomfort in the past, also endorses gas symptoms. She has no clearly documented history of obstructive CAD or cardiomyopathy based on remote testing. ECG shows inferolateral ST-T wave abnormalities that are new in comparison to her presenting tracing with sinus tachycardia, however similar to more remote tracings. Cardiac markers are normal arguing against ACS. Chest x-ray describes mild vascular congestion with left-sided atelectasis. She has had no recurrent symptoms under observation and our plan is to proceed with an inpatient Lexiscan Cardiolite for objective ischemic evaluation. Echocardiogram is also pending. Further recommendations to follow.  Satira Sark, M.D., F.A.C.C.

## 2013-03-31 NOTE — Progress Notes (Signed)
    Lexiscan cardiolite low risk without clear evidence of scar or ischemia. Discussed with Dr. Fagan, also tOuida Sillshe patient and her daughter. No further cardiac testing planned now. If she continues to have recurrent chest pain, particularly if limiting dialysis sessions, we can certainly see her back and discuss proceeding with invasive cardiac testing (heart catheterization).  Jonelle SidleSamuel G. Erza Mothershead, M.D., F.A.C.C.

## 2013-04-01 NOTE — Discharge Summary (Signed)
NAMRedmond Baseman:  Bryden, Nayab               ACCOUNT NO.:  [id number]  MEDICAL RECORD NO.:  [id number]  LOCATION:  A331                          FACILITY:  [facility]  PHYSICIAN:  Kingsley Callanderoy O. Ouida SillsFagan, MD       DATE OF BIRTH:  [birth year]  DATE OF ADMISSION:  03/30/2013 DATE OF DISCHARGE:  03/17/2015LH                              DISCHARGE SUMMARY   DISCHARGE DIAGNOSES: 1. Chest pain, myocardial infarction ruled out. 2. End-stage renal disease. 3. Hypertension. 4. Hyperlipidemia. 5. Gastroesophageal reflux disease. 6. Chronic obstructive pulmonary disease. 7. Osteoporosis. 8. Anemia of chronic disease. 9. Peripheral neuropathy. 10.Schizoaffective disorder. 11.Osteoarthritis.  DISCHARGE MEDICATIONS:  Alprazolam 0.5 mg b.i.d. and t.i.d. on dialysis days, amitriptyline 25 mg at bedtime, amlodipine 10 mg daily, atorvastatin 40 mg daily, citalopram 20 mg daily, donepezil 5 mg daily, DuoNeb q.i.d. p.r.n., labetalol 100 mg daily, Robaxin 500 mg q.i.d. p.r.n., oxycodone 5 mg t.i.d., pantoprazole 40 mg daily, risperidone 0.5 mg 2 tablets daily, Renagel 1 capsule daily, and Demadex 50 mg daily.  HOSPITAL COURSE:  This patient is a 73 year old female, who was transferred from the dialysis center to the emergency room after experiencing substernal chest pain while on dialysis.  She had radiation of pain to the left side of her neck.  There was no diaphoresis, vomiting, or shortness of breath.  She did experience some nausea.  She was treated with 3 sublingual nitroglycerin, which helped.  Her EKG initially revealed sinus tachycardia with no acute ischemic changes. Her rate was at 117.  Chest x-ray revealed mild vascular congestion. Her initial troponin level was normal.  She was hospitalized in a monitored setting.  She remained pain free. Her serial troponin levels were normal.  Her repeat EKG revealed normal sinus rhythm with inferior and lateral T-wave inversions similar to findings on previous tracings,  but clearly different from her presenting EKG.  She was seen in Cardiology consultation by Dr. Diona BrownerMcDowell.  She underwent a Lexiscan Cardiolite stress test.  This was felt to be a low risk study.  There were no diagnostic ST-segment changes.  LVEF was 67%. No ischemic changes were noted.  The patient was seen in Nephrology consultation by Dr. Wolfgang PhoenixBhutani.  She will continue dialysis tomorrow.  She has remained pain-free and stable for discharge on the afternoon of the 17th.  She will be seen in follow up at dialysis tomorrow.  She will be seen in followup in my office in 2 weeks.  She will continue her routine home medications including treatment of her reflux with pantoprazole.  Her symptoms were felt to possibly be consistent with esophageal reflux.  If she continues to have similar chest pain she may be ultimately require cardiac catheterization.  Condition at discharge is much improved.     Kingsley Callanderoy O. Ouida SillsFagan, MD     ROF/MEDQ  D:  03/31/2013  T:  04/01/2013  Job:  [id number]

## 2013-05-06 ENCOUNTER — Other Ambulatory Visit (HOSPITAL_COMMUNITY): Payer: Self-pay | Admitting: Psychiatry

## 2013-06-04 ENCOUNTER — Other Ambulatory Visit (HOSPITAL_COMMUNITY): Payer: Self-pay | Admitting: Psychiatry

## 2013-06-04 MED ORDER — CITALOPRAM HYDROBROMIDE 20 MG PO TABS: 20.0000 mg | ORAL_TABLET | Freq: Every day | ORAL | Status: DC

## 2013-06-18 ENCOUNTER — Telehealth (HOSPITAL_COMMUNITY): Payer: Self-pay | Admitting: *Deleted

## 2013-06-18 ENCOUNTER — Ambulatory Visit (HOSPITAL_COMMUNITY): Payer: Self-pay | Admitting: Psychiatry

## 2013-06-18 NOTE — Telephone Encounter (Signed)
refilled 

## 2013-07-06 ENCOUNTER — Other Ambulatory Visit (HOSPITAL_COMMUNITY): Payer: Self-pay | Admitting: Psychiatry

## 2013-07-06 ENCOUNTER — Telehealth (HOSPITAL_COMMUNITY): Payer: Self-pay | Admitting: *Deleted

## 2013-07-06 MED ORDER — AMITRIPTYLINE HCL 25 MG PO TABS: 25.0000 mg | ORAL_TABLET | Freq: Every day | ORAL | Status: DC

## 2013-07-06 MED ORDER — RISPERIDONE 0.5 MG PO TABS: 1.0000 mg | ORAL_TABLET | Freq: Every day | ORAL | Status: DC

## 2013-07-06 NOTE — Telephone Encounter (Signed)
Done except methocarbamol

## 2013-07-07 ENCOUNTER — Telehealth (HOSPITAL_COMMUNITY): Payer: Self-pay | Admitting: *Deleted

## 2013-07-07 NOTE — Telephone Encounter (Signed)
Must be filled by PCP 

## 2013-07-10 ENCOUNTER — Other Ambulatory Visit (HOSPITAL_COMMUNITY): Payer: Self-pay | Admitting: Psychiatry

## 2013-08-05 ENCOUNTER — Other Ambulatory Visit (HOSPITAL_COMMUNITY): Payer: Self-pay | Admitting: Psychiatry

## 2013-08-06 ENCOUNTER — Other Ambulatory Visit (HOSPITAL_COMMUNITY): Payer: Self-pay | Admitting: Psychiatry

## 2013-08-06 ENCOUNTER — Encounter (HOSPITAL_COMMUNITY): Payer: Medicare Other | Attending: Hematology

## 2013-08-06 VITALS — BP 102/52 | HR 74 | Resp 20

## 2013-08-06 DIAGNOSIS — M810 Age-related osteoporosis without current pathological fracture: Secondary | ICD-10-CM | POA: Insufficient documentation

## 2013-08-06 MED ORDER — DENOSUMAB 60 MG/ML ~~LOC~~ SOLN
60.0000 mg | Freq: Once | SUBCUTANEOUS | Status: AC
  Administered 2013-08-06: 60 mg via SUBCUTANEOUS
  Filled 2013-08-06: qty 1

## 2013-08-06 NOTE — Progress Notes (Signed)
[name] presents today for injection per the provider's orders.  Prolia administration without incident; see MAR for injection details.  Patient tolerated procedure well and without incident.  No questions or complaints noted at this time.

## 2013-08-12 ENCOUNTER — Emergency Department (HOSPITAL_COMMUNITY): Payer: Medicare Other

## 2013-08-12 ENCOUNTER — Encounter (HOSPITAL_COMMUNITY): Payer: Self-pay | Admitting: Emergency Medicine

## 2013-08-12 ENCOUNTER — Other Ambulatory Visit: Payer: Self-pay

## 2013-08-12 ENCOUNTER — Observation Stay (HOSPITAL_COMMUNITY)
Admission: EM | Admit: 2013-08-12 | Discharge: 2013-08-14 | Disposition: A | Discharge status: Home / Self Care | Payer: Medicare Other | Attending: Internal Medicine | Admitting: Internal Medicine

## 2013-08-12 DIAGNOSIS — N186 End stage renal disease: Secondary | ICD-10-CM

## 2013-08-12 DIAGNOSIS — Z888 Allergy status to other drugs, medicaments and biological substances status: Secondary | ICD-10-CM | POA: Diagnosis not present

## 2013-08-12 DIAGNOSIS — I10 Essential (primary) hypertension: Secondary | ICD-10-CM | POA: Diagnosis present

## 2013-08-12 DIAGNOSIS — G8929 Other chronic pain: Secondary | ICD-10-CM | POA: Diagnosis present

## 2013-08-12 DIAGNOSIS — K219 Gastro-esophageal reflux disease without esophagitis: Secondary | ICD-10-CM | POA: Diagnosis not present

## 2013-08-12 DIAGNOSIS — Z87891 Personal history of nicotine dependence: Secondary | ICD-10-CM | POA: Insufficient documentation

## 2013-08-12 DIAGNOSIS — I4719 Other supraventricular tachycardia: Secondary | ICD-10-CM | POA: Diagnosis present

## 2013-08-12 DIAGNOSIS — R079 Chest pain, unspecified: Secondary | ICD-10-CM | POA: Diagnosis not present

## 2013-08-12 DIAGNOSIS — Z79899 Other long term (current) drug therapy: Secondary | ICD-10-CM | POA: Insufficient documentation

## 2013-08-12 DIAGNOSIS — J45909 Unspecified asthma, uncomplicated: Secondary | ICD-10-CM | POA: Insufficient documentation

## 2013-08-12 DIAGNOSIS — F32A Depression, unspecified: Secondary | ICD-10-CM | POA: Diagnosis present

## 2013-08-12 DIAGNOSIS — E7800 Pure hypercholesterolemia, unspecified: Secondary | ICD-10-CM | POA: Diagnosis not present

## 2013-08-12 DIAGNOSIS — M129 Arthropathy, unspecified: Secondary | ICD-10-CM | POA: Diagnosis not present

## 2013-08-12 DIAGNOSIS — F259 Schizoaffective disorder, unspecified: Secondary | ICD-10-CM | POA: Diagnosis present

## 2013-08-12 DIAGNOSIS — F419 Anxiety disorder, unspecified: Secondary | ICD-10-CM

## 2013-08-12 DIAGNOSIS — I471 Supraventricular tachycardia: Secondary | ICD-10-CM | POA: Diagnosis present

## 2013-08-12 DIAGNOSIS — I517 Cardiomegaly: Secondary | ICD-10-CM

## 2013-08-12 DIAGNOSIS — Z992 Dependence on renal dialysis: Secondary | ICD-10-CM | POA: Insufficient documentation

## 2013-08-12 DIAGNOSIS — Z862 Personal history of diseases of the blood and blood-forming organs and certain disorders involving the immune mechanism: Secondary | ICD-10-CM | POA: Diagnosis not present

## 2013-08-12 DIAGNOSIS — R000 Tachycardia, unspecified: Secondary | ICD-10-CM | POA: Diagnosis not present

## 2013-08-12 DIAGNOSIS — M546 Pain in thoracic spine: Secondary | ICD-10-CM | POA: Insufficient documentation

## 2013-08-12 DIAGNOSIS — I120 Hypertensive chronic kidney disease with stage 5 chronic kidney disease or end stage renal disease: Secondary | ICD-10-CM | POA: Diagnosis not present

## 2013-08-12 DIAGNOSIS — M549 Dorsalgia, unspecified: Secondary | ICD-10-CM

## 2013-08-12 DIAGNOSIS — N189 Chronic kidney disease, unspecified: Secondary | ICD-10-CM | POA: Diagnosis present

## 2013-08-12 DIAGNOSIS — J449 Chronic obstructive pulmonary disease, unspecified: Secondary | ICD-10-CM | POA: Diagnosis present

## 2013-08-12 DIAGNOSIS — Z8701 Personal history of pneumonia (recurrent): Secondary | ICD-10-CM | POA: Diagnosis not present

## 2013-08-12 DIAGNOSIS — R072 Precordial pain: Secondary | ICD-10-CM

## 2013-08-12 DIAGNOSIS — Z791 Long term (current) use of non-steroidal anti-inflammatories (NSAID): Secondary | ICD-10-CM | POA: Diagnosis not present

## 2013-08-12 DIAGNOSIS — F329 Major depressive disorder, single episode, unspecified: Secondary | ICD-10-CM | POA: Diagnosis present

## 2013-08-12 DIAGNOSIS — Z8669 Personal history of other diseases of the nervous system and sense organs: Secondary | ICD-10-CM | POA: Insufficient documentation

## 2013-08-12 DIAGNOSIS — D649 Anemia, unspecified: Secondary | ICD-10-CM | POA: Diagnosis present

## 2013-08-12 LAB — TROPONIN I
Troponin I: <0.3 ng/mL (ref <0.30)
Troponin I: <0.3 ng/mL (ref <0.30)

## 2013-08-12 LAB — BASIC METABOLIC PANEL
Anion gap: 17 — ABNORMAL HIGH (ref 5–15)
BUN: 15 mg/dL (ref 6–23)
CHLORIDE: 94 meq/L — AB (ref 96–112)
CO2: 24 mEq/L (ref 19–32)
CREATININE: 6.1 mg/dL — AB (ref 0.50–1.10)
Calcium: 7.4 mg/dL — ABNORMAL LOW (ref 8.4–10.5)
GFR calc Af Amer: 7 mL/min — ABNORMAL LOW (ref >90)
GFR calc non Af Amer: 6 mL/min — ABNORMAL LOW (ref >90)
GLUCOSE: 94 mg/dL (ref 70–99)
POTASSIUM: 4 meq/L (ref 3.7–5.3)
Sodium: 135 mEq/L — ABNORMAL LOW (ref 137–147)

## 2013-08-12 LAB — CBC
HEMATOCRIT: 33.4 % — AB (ref 36.0–46.0)
HEMOGLOBIN: 10.7 g/dL — AB (ref 12.0–15.0)
MCH: 31.1 pg (ref 26.0–34.0)
MCHC: 32 g/dL (ref 30.0–36.0)
MCV: 97.1 fL (ref 78.0–100.0)
Platelets: 178 10*3/uL (ref 150–400)
RBC: 3.44 MIL/uL — ABNORMAL LOW (ref 3.87–5.11)
RDW: 15.8 % — AB (ref 11.5–15.5)
WBC: 9.2 10*3/uL (ref 4.0–10.5)

## 2013-08-12 LAB — PRO B NATRIURETIC PEPTIDE: Pro B Natriuretic peptide (BNP): 7825 pg/mL — ABNORMAL HIGH (ref 0–125)

## 2013-08-12 LAB — MRSA PCR SCREENING: MRSA by PCR: NEGATIVE

## 2013-08-12 MED ORDER — AMITRIPTYLINE HCL 25 MG PO TABS
25.0000 mg | ORAL_TABLET | Freq: Every day | ORAL | Status: DC
  Administered 2013-08-12 – 2013-08-13 (×2): 25 mg via ORAL
  Filled 2013-08-12 (×2): qty 1

## 2013-08-12 MED ORDER — DILTIAZEM HCL 25 MG/5ML IV SOLN: 20.0000 mg | Freq: Once | INTRAVENOUS | Status: DC

## 2013-08-12 MED ORDER — ENOXAPARIN SODIUM 30 MG/0.3ML ~~LOC~~ SOLN
30.0000 mg | SUBCUTANEOUS | Status: DC
  Administered 2013-08-12 – 2013-08-13 (×2): 30 mg via SUBCUTANEOUS
  Filled 2013-08-12 (×2): qty 0.3

## 2013-08-12 MED ORDER — ALUM & MAG HYDROXIDE-SIMETH 200-200-20 MG/5ML PO SUSP
30.0000 mL | Freq: Four times a day (QID) | ORAL | Status: DC | PRN
  Administered 2013-08-12 – 2013-08-13 (×2): 30 mL via ORAL
  Filled 2013-08-12 (×2): qty 30

## 2013-08-12 MED ORDER — ATORVASTATIN CALCIUM 40 MG PO TABS
40.0000 mg | ORAL_TABLET | Freq: Every evening | ORAL | Status: DC
  Administered 2013-08-12 – 2013-08-13 (×2): 40 mg via ORAL
  Filled 2013-08-12 (×2): qty 1

## 2013-08-12 MED ORDER — CINACALCET HCL 30 MG PO TABS
30.0000 mg | ORAL_TABLET | Freq: Every day | ORAL | Status: DC
  Administered 2013-08-13 – 2013-08-14 (×2): 30 mg via ORAL
  Filled 2013-08-12 (×5): qty 1

## 2013-08-12 MED ORDER — ACETAMINOPHEN 325 MG PO TABS
650.0000 mg | ORAL_TABLET | Freq: Four times a day (QID) | ORAL | Status: DC | PRN
  Administered 2013-08-13: 650 mg via ORAL
  Filled 2013-08-12: qty 2

## 2013-08-12 MED ORDER — LEVALBUTEROL HCL 0.63 MG/3ML IN NEBU: 0.6300 mg | INHALATION_SOLUTION | Freq: Four times a day (QID) | RESPIRATORY_TRACT | Status: DC | PRN

## 2013-08-12 MED ORDER — MORPHINE SULFATE 2 MG/ML IJ SOLN: 1.0000 mg | INTRAMUSCULAR | Status: DC | PRN

## 2013-08-12 MED ORDER — ALPRAZOLAM 0.5 MG PO TABS: 0.5000 mg | ORAL_TABLET | ORAL | Status: DC

## 2013-08-12 MED ORDER — ALPRAZOLAM 0.5 MG PO TABS
0.5000 mg | ORAL_TABLET | ORAL | Status: DC
  Administered 2013-08-12: 0.5 mg via ORAL

## 2013-08-12 MED ORDER — AMLODIPINE BESYLATE 5 MG PO TABS
10.0000 mg | ORAL_TABLET | Freq: Every day | ORAL | Status: DC
  Administered 2013-08-12 – 2013-08-14 (×3): 10 mg via ORAL
  Filled 2013-08-12 (×3): qty 2

## 2013-08-12 MED ORDER — DILTIAZEM HCL 25 MG/5ML IV SOLN
10.0000 mg | Freq: Once | INTRAVENOUS | Status: AC
  Administered 2013-08-12: 10 mg via INTRAVENOUS
  Filled 2013-08-12: qty 5

## 2013-08-12 MED ORDER — METHOCARBAMOL 500 MG PO TABS
500.0000 mg | ORAL_TABLET | Freq: Four times a day (QID) | ORAL | Status: DC | PRN
  Administered 2013-08-12 – 2013-08-14 (×5): 500 mg via ORAL
  Filled 2013-08-12 (×5): qty 1

## 2013-08-12 MED ORDER — ONDANSETRON HCL 4 MG/2ML IJ SOLN: 4.0000 mg | Freq: Four times a day (QID) | INTRAMUSCULAR | Status: DC | PRN

## 2013-08-12 MED ORDER — OXYCODONE-ACETAMINOPHEN 5-325 MG PO TABS
1.0000 | ORAL_TABLET | Freq: Once | ORAL | Status: AC
  Administered 2013-08-12: 1 via ORAL

## 2013-08-12 MED ORDER — PANTOPRAZOLE SODIUM 40 MG PO TBEC
40.0000 mg | DELAYED_RELEASE_TABLET | Freq: Every day | ORAL | Status: DC
  Administered 2013-08-12 – 2013-08-14 (×3): 40 mg via ORAL
  Filled 2013-08-12 (×3): qty 1

## 2013-08-12 MED ORDER — OXYCODONE HCL 5 MG PO TABS
5.0000 mg | ORAL_TABLET | Freq: Four times a day (QID) | ORAL | Status: DC | PRN
  Administered 2013-08-12 – 2013-08-14 (×6): 5 mg via ORAL
  Filled 2013-08-12 (×6): qty 1

## 2013-08-12 MED ORDER — DONEPEZIL HCL 5 MG PO TABS
5.0000 mg | ORAL_TABLET | Freq: Every day | ORAL | Status: DC
  Administered 2013-08-12 – 2013-08-13 (×2): 5 mg via ORAL
  Filled 2013-08-12 (×2): qty 1

## 2013-08-12 MED ORDER — LABETALOL HCL 200 MG PO TABS
100.0000 mg | ORAL_TABLET | Freq: Two times a day (BID) | ORAL | Status: DC
  Administered 2013-08-12 – 2013-08-14 (×5): 100 mg via ORAL
  Filled 2013-08-12 (×5): qty 1

## 2013-08-12 MED ORDER — AMIODARONE HCL 200 MG PO TABS
200.0000 mg | ORAL_TABLET | Freq: Two times a day (BID) | ORAL | Status: DC
  Administered 2013-08-12 – 2013-08-14 (×5): 200 mg via ORAL
  Filled 2013-08-12 (×5): qty 1

## 2013-08-12 MED ORDER — ALPRAZOLAM 0.5 MG PO TABS
0.5000 mg | ORAL_TABLET | Freq: Two times a day (BID) | ORAL | Status: DC
  Administered 2013-08-12 – 2013-08-14 (×4): 0.5 mg via ORAL
  Filled 2013-08-12 (×5): qty 1

## 2013-08-12 MED ORDER — IPRATROPIUM BROMIDE 0.02 % IN SOLN: 500.0000 ug | Freq: Four times a day (QID) | RESPIRATORY_TRACT | Status: DC | PRN

## 2013-08-12 MED ORDER — SODIUM CHLORIDE 0.9 % IJ SOLN: 3.0000 mL | INTRAMUSCULAR | Status: DC | PRN

## 2013-08-12 MED ORDER — OXYCODONE-ACETAMINOPHEN 5-325 MG PO TABS
ORAL_TABLET | ORAL | Status: AC
  Administered 2013-08-12: 1 via ORAL
  Filled 2013-08-12: qty 1

## 2013-08-12 MED ORDER — ACETAMINOPHEN 650 MG RE SUPP: 650.0000 mg | Freq: Four times a day (QID) | RECTAL | Status: DC | PRN

## 2013-08-12 MED ORDER — SENNA 8.6 MG PO TABS
1.0000 | ORAL_TABLET | Freq: Two times a day (BID) | ORAL | Status: DC
Start: 2013-08-12 — End: 2013-08-14
  Administered 2013-08-12 – 2013-08-14 (×5): 8.6 mg via ORAL
  Filled 2013-08-12 (×5): qty 1

## 2013-08-12 MED ORDER — FLEET ENEMA 7-19 GM/118ML RE ENEM: 1.0000 | ENEMA | Freq: Once | RECTAL | Status: AC | PRN

## 2013-08-12 MED ORDER — SODIUM CHLORIDE 0.9 % IJ SOLN
3.0000 mL | Freq: Two times a day (BID) | INTRAMUSCULAR | Status: DC
  Administered 2013-08-12 – 2013-08-14 (×5): 3 mL via INTRAVENOUS

## 2013-08-12 MED ORDER — NITROGLYCERIN 2 % TD OINT
1.0000 [in_us] | TOPICAL_OINTMENT | Freq: Once | TRANSDERMAL | Status: DC
  Filled 2013-08-12: qty 1

## 2013-08-12 MED ORDER — CITALOPRAM HYDROBROMIDE 20 MG PO TABS
20.0000 mg | ORAL_TABLET | Freq: Every day | ORAL | Status: DC
  Administered 2013-08-12 – 2013-08-14 (×3): 20 mg via ORAL
  Filled 2013-08-12 (×3): qty 1

## 2013-08-12 MED ORDER — ALPRAZOLAM 0.5 MG PO TABS
0.5000 mg | ORAL_TABLET | Freq: Once | ORAL | Status: AC
  Administered 2013-08-12: 0.5 mg via ORAL
  Filled 2013-08-12: qty 1

## 2013-08-12 MED ORDER — BISACODYL 10 MG RE SUPP: 10.0000 mg | Freq: Every day | RECTAL | Status: DC | PRN

## 2013-08-12 MED ORDER — GABAPENTIN 100 MG PO CAPS
100.0000 mg | ORAL_CAPSULE | Freq: Every day | ORAL | Status: DC
  Administered 2013-08-12 – 2013-08-14 (×3): 100 mg via ORAL
  Filled 2013-08-12 (×3): qty 1

## 2013-08-12 MED ORDER — NITROGLYCERIN 2 % TD OINT
1.0000 [in_us] | TOPICAL_OINTMENT | Freq: Once | TRANSDERMAL | Status: AC
  Administered 2013-08-12: 1 [in_us] via TOPICAL

## 2013-08-12 MED ORDER — SODIUM CHLORIDE 0.9 % IV SOLN: 250.0000 mL | INTRAVENOUS | Status: DC | PRN

## 2013-08-12 MED ORDER — ONDANSETRON HCL 4 MG PO TABS: 4.0000 mg | ORAL_TABLET | Freq: Four times a day (QID) | ORAL | Status: DC | PRN

## 2013-08-12 MED ORDER — RISPERIDONE 1 MG PO TABS
1.0000 mg | ORAL_TABLET | Freq: Every day | ORAL | Status: DC
  Administered 2013-08-12 – 2013-08-13 (×2): 1 mg via ORAL
  Filled 2013-08-12 (×2): qty 1

## 2013-08-12 MED ORDER — LEVALBUTEROL TARTRATE 45 MCG/ACT IN AERO: 1.0000 | INHALATION_SPRAY | Freq: Four times a day (QID) | RESPIRATORY_TRACT | Status: DC | PRN

## 2013-08-12 NOTE — ED Notes (Signed)
New bed request for tele and not stepdown

## 2013-08-12 NOTE — Consult Note (Signed)
Reason for Consult: Chest pain Referring Physician:  PTH  [name] is an 73 y.o. female patient brought in to the emergency room with chest pain while undergoing dialysis. She was found to be tachycardic at 137 beats per minute. The patient states she had chest tightness into her neck this morning before dialysis that eased with a muscle relaxant and 2 Tums. She frequently has chest pain relieved with Gas-X. She states her chest felt full into her back and choking sensation in her throat.  We saw the patient in March 2015 for chest pain during dialysis. MI was ruled out and she had a Lexiscan Myoview that was low risk without clear evidence of scar or ischemia. EF 67%.  Dr. Domenic Polite reviewed her presenting ECG finding it to be suspicious for an atrial tachycardia or atypical atrial flutter. She was given a 10 mg bolus of IV Cardizem and converted to sinus rhythm.  She does not report palpitations, but the thought is that she may be experiencing paroxysmal atrial arrhythmias as a source of these intermittent symptoms.  Past Medical History  Diagnosis Date  . Essential hypertension, benign   . Hypercholesterolemia   . GERD (gastroesophageal reflux disease)   . Asthma   . Osteoporosis   . ESRD (end stage renal disease) on dialysis   . Anemia of chronic disease   . Peripheral neuropathy   . Schizoaffective disorder   . Arthritis   . History of pneumonia     Past Surgical History  Procedure Laterality Date  . Tubal ligation    . Breast biopsy    . Refractive surgery      Right eye  . Bone marrow aspiration    . Bone marrow biopsy    . Insertion of dialysis catheter  01/23/2011    Procedure: INSERTION OF DIALYSIS CATHETER;  Surgeon: Angelia Mould, MD;  Location: [place];  Service: Vascular;  Laterality: N/A;  Insertion of Dialysis Catheter- Right Internal Jugular  . Yag laser application  26/83/4196    Procedure: YAG LASER APPLICATION;  Surgeon: Elta Guadeloupe T. Gershon Crane, MD;   Location: [facility];  Service: Ophthalmology;  Laterality: Left;    Family History  Problem Relation Age of Onset  . Alcohol abuse Brother   . Bipolar disorder Brother   . Dementia Mother   . Depression Mother   . ADD / ADHD Neg Hx   . Anxiety disorder Neg Hx   . Drug abuse Neg Hx   . OCD Neg Hx   . Paranoid behavior Neg Hx   . Schizophrenia Neg Hx   . Seizures Neg Hx   . Physical abuse Neg Hx   . Sexual abuse Neg Hx   . Healthy Daughter   . Healthy Daughter   . Healthy Daughter     Social History:  reports that she has quit smoking. Her smoking use included Cigarettes. She smoked 0.00 packs per day. She does not have any smokeless tobacco history on file. She reports that she does not drink alcohol or use illicit drugs.  Allergies:  Allergies  Allergen Reactions  . Neurontin [Gabapentin] Other (See Comments)    Had about every known side effect with $RemoveBef'300mg'RfkDiaKocy$  three a day on this. DO NOT TRY THIS AGAIN!  . Ativan [Lorazepam] Nausea Only and Other (See Comments)    Disoriented, not feel like herself   . Cymbalta [Duloxetine Hcl] Other (See Comments)    Noted hallucinations this, but her primary diagnosis is  schizophrenia   . Ferrous Gluconate Nausea Only    hypotension    Medications: No current facility-administered medications on file prior to encounter.   Current Outpatient Prescriptions on File Prior to Encounter  Medication Sig Dispense Refill  . amitriptyline (ELAVIL) 25 MG tablet Take 1 tablet (25 mg total) by mouth at bedtime.  30 tablet  2  . amLODipine (NORVASC) 10 MG tablet Take 1 tablet (10 mg total) by mouth daily.  30 tablet  12  . atorvastatin (LIPITOR) 40 MG tablet Take 1 tablet by mouth daily.      . citalopram (CELEXA) 20 MG tablet Take 1 tablet (20 mg total) by mouth daily.  30 tablet  2  . donepezil (ARICEPT) 5 MG tablet Take 5 mg by mouth at bedtime.       Marland Kitchen ipratropium (ATROVENT) 0.02 % nebulizer solution Take 500 mcg by nebulization 4 (four) times daily  as needed. Shortness of Breath      . labetalol (NORMODYNE) 100 MG tablet Take 100 mg by mouth 2 (two) times daily.       . methocarbamol (ROBAXIN) 500 MG tablet TAKE (1) TABLET BY MOUTH FOUR TIMES DAILY.  120 tablet  3  . oxyCODONE (OXY IR/ROXICODONE) 5 MG immediate release tablet Take 5 mg by mouth every 6 (six) hours as needed for moderate pain. pain      . pantoprazole (PROTONIX) 40 MG tablet Take 1 tablet (40 mg total) by mouth daily.  30 tablet  12  . risperiDONE (RISPERDAL) 0.5 MG tablet Take 2 tablets (1 mg total) by mouth at bedtime.  60 tablet  2    Results for orders placed during the hospital encounter of 08/12/13 (from the past 48 hour(s))  CBC     Status: Abnormal   Collection Time    08/12/13  9:52 AM      Result Value Ref Range   WBC 9.2  4.0 - 10.5 K/uL   RBC 3.44 (*) 3.87 - 5.11 MIL/uL   Hemoglobin 10.7 (*) 12.0 - 15.0 g/dL   HCT 33.4 (*) 36.0 - 46.0 %   MCV 97.1  78.0 - 100.0 fL   MCH 31.1  26.0 - 34.0 pg   MCHC 32.0  30.0 - 36.0 g/dL   RDW 15.8 (*) 11.5 - 15.5 %   Platelets 178  150 - 400 K/uL  BASIC METABOLIC PANEL     Status: Abnormal   Collection Time    08/12/13  9:52 AM      Result Value Ref Range   Sodium 135 (*) 137 - 147 mEq/L   Potassium 4.0  3.7 - 5.3 mEq/L   Chloride 94 (*) 96 - 112 mEq/L   CO2 24  19 - 32 mEq/L   Glucose, Bld 94  70 - 99 mg/dL   BUN 15  6 - 23 mg/dL   Creatinine, Ser 6.10 (*) 0.50 - 1.10 mg/dL   Calcium 7.4 (*) 8.4 - 10.5 mg/dL   GFR calc non Af Amer 6 (*) >90 mL/min   GFR calc Af Amer 7 (*) >90 mL/min   Comment: (NOTE)     The eGFR has been calculated using the CKD EPI equation.     This calculation has not been validated in all clinical situations.     eGFR's persistently <90 mL/min signify possible Chronic Kidney     Disease.   Anion gap 17 (*) 5 - 15  TROPONIN I     Status: None  Collection Time    08/12/13  9:52 AM      Result Value Ref Range   Troponin I <0.30  <0.30 ng/mL   Comment:            Due to the release  kinetics of cTnI,     a negative result within the first hours     of the onset of symptoms does not rule out     myocardial infarction with certainty.     If myocardial infarction is still suspected,     repeat the test at appropriate intervals.  PRO B NATRIURETIC PEPTIDE     Status: Abnormal   Collection Time    08/12/13  9:52 AM      Result Value Ref Range   Pro B Natriuretic peptide (BNP) 7825.0 (*) 0 - 125 pg/mL    Dg Chest Portable 1 View  08/12/2013   CLINICAL DATA:  Chest pain.  EXAM: PORTABLE CHEST - 1 VIEW  COMPARISON:  03/30/2013.  FINDINGS: Dialysis catheter noted with tip projected over superior vena cava. Cardiomegaly with normal pulmonary vascularity. Bilateral pulmonary interstitial prominence noted. No pleural effusion or pneumothorax. No acute bony abnormality.  IMPRESSION: 1. Dialysis catheter with tip projected over superior vena cava. 2. Congestive heart failure from interstitial edema .   Electronically Signed   By: Maisie Fus  Register   On: 08/12/2013 10:23    ROS No reproducible exertional chest pain. Stable dyspnea on exertion. No cough, fever, or chills. Stable appetite. Now using new left arm AV fistula for HD. Other systems reviewed and negative.  Blood pressure 115/68, pulse 74, temperature 98.2 F (36.8 C), temperature source Oral, resp. rate 13, height 4\' 11"  (1.499 m), weight 223 lb (101.152 kg), SpO2 98.00%. Physical Exam PHYSICAL EXAM: Obese, in no acute distress. Neck: No obvious JVD, Bruit, or thyroid enlargement Lungs: No tachypnea, clear without wheezing, rales, or rhonchi Cardiovascular: RRR, PMI not displaced, soft systolic murmur, no gallops. Abdomen: BS normal. Soft without organomegaly, masses, lesions or tenderness. Extremities: without cyanosis, clubbing, has trace edema. Good distal pulses bilateral SKin: Warm, no lesions or rashes  Musculoskeletal: No deformities Neuro: no focal signs   Assessment/Plan:  Chest pain during dialysis.  Patient had similar episode in March 2015 at which time Lexiscan stress test was low risk without evidence of scar or ischemia EF 67%. Currently has evidence of parixysmal atrial tachycardia or atypical atrial flutter as potential explanation for her symptoms. Current in sinus rhythm after Cardizem bolus.  COPD  End stage renal disease on hemodialysis  Hypertension  Hyperlipidemia  GE reflux  Schizophrenia disorder  Osteoarthritis  April 2015 PA-C   Attending note:  Patient seen and examined. Reviewed records and modified above note by Ms. Jacolyn Reedy PA-C. Patient presents with chest discomfort, started before regular dialysis, but somewhat worse during treatment. She is noted to have paroxysmal atrial tachycardia versus atypical atrial flutter that has converted to sinus rhythm after Cardizem bolus. Baseline medications include Labetalol and Norvasc (she takes them after dialysis). Suspect that she may be having intermittent symptoms related to arrhythmia rather than ischemia. Cardiolite in March was low risk and initial troponin I levels are normal. Pro-BNP elevated (LVEF normal by prior Cardiolite but no recent echocardiogram). For now would continue current outpatient regimen, start Amiodarone 200 mg BID for rhythm suppression. Check LFTs and TSH for baseline. Followup ECG in sinus rhythm. Echocardiogram will be ordered. Of note, she was due to have temporary HD catheter removed electively tomorrow  by Dr. Arnoldo Morale. Can likely proceed with this as long as rhythm remains stable and cardiac enzymes are negative.We will follow.  Satira Sark, M.D., F.A.C.C.

## 2013-08-12 NOTE — H&P (Signed)
[facility] History and Physical  ROBERT SUNGA WUJ:[id number] DOB: 1940-10-01 DOA: 08/12/2013  Referring physician:  PCP: Asencion Noble, MD   Chief Complaint: Chest pain  HPI: [name] is a 73 y.o. female with a medical history that includes end-stage renal disease on Monday Wednesday Friday dialysis patient, hypertension, COPD, anemia, schizoaffective disorder presents to the emergency department from the dialysis center with chief complaint of left-sided chest pain. Initial evaluation ECG reviewed by cardiology finding it to be concerning for atrial tachycardia or atypical atrial flutter.  She was provided with 10 mg of Cardizem intravenously and converted to sinus rhythm.  Patient reports that this morning before dialysis she felt a chest "tightness" and very "gassy". She took 2 times and a muscle relaxer with some improvement. About 30 minutes before dialysis was ending she developed sudden chest pain located in the left anterior radiating to the back. Associated symptoms include palpitations, a choking sensation, diaphoresis, shortness of breath. She denies abdominal pain nausea vomiting headache visual disturbances syncope or near-syncope. She denies any recent fever chills or sick contacts. He does report she saw her primary care provider approximately 2 weeks ago for a cough.  Workup in the emergency room reveals a sodium of 135, creatinine of 6.10, proBNP 7825, hemoglobin 10.7. Initial troponin negative. At the time of my exam she is hemodynamically stable with a heart rate of 74. She is not hypoxic. She was provided with nitroglycerin paste, 10 mg of Cardizem IV and xanax.    Review of Systems:  10 point review of systems completed and all systems are negative except as indicated in the history of present illness  Past Medical History  Diagnosis Date  . Essential hypertension, benign   . Hypercholesterolemia   . GERD (gastroesophageal reflux disease)   . Asthma   .  Osteoporosis   . ESRD (end stage renal disease) on dialysis   . Anemia of chronic disease   . Peripheral neuropathy   . Schizoaffective disorder   . Arthritis   . History of pneumonia    Past Surgical History  Procedure Laterality Date  . Tubal ligation    . Breast biopsy    . Refractive surgery      Right eye  . Bone marrow aspiration    . Bone marrow biopsy    . Insertion of dialysis catheter  01/23/2011    Procedure: INSERTION OF DIALYSIS CATHETER;  Surgeon: Angelia Mould, MD;  Location: [place];  Service: Vascular;  Laterality: N/A;  Insertion of Dialysis Catheter- Right Internal Jugular  . Yag laser application  95/62/1308    Procedure: YAG LASER APPLICATION;  Surgeon: Elta Guadeloupe T. Gershon Crane, MD;  Location: [facility];  Service: Ophthalmology;  Laterality: Left;   Social History:  reports that she has quit smoking. Her smoking use included Cigarettes. She smoked 0.00 packs per day. She does not have any smokeless tobacco history on file. She reports that she does not drink alcohol or use illicit drugs. She lives at home with her family. She is independent with ADLs Allergies  Allergen Reactions  . Neurontin [Gabapentin] Other (See Comments)    Had about every known side effect with 364m three a day on this. DO NOT TRY THIS AGAIN!  . Ativan [Lorazepam] Nausea Only and Other (See Comments)    Disoriented, not feel like herself   . Cymbalta [Duloxetine Hcl] Other (See Comments)    Noted hallucinations this, but her primary diagnosis is schizophrenia   . Ferrous  Gluconate Nausea Only    hypotension    Family History  Problem Relation Age of Onset  . Alcohol abuse Brother   . Bipolar disorder Brother   . Dementia Mother   . Depression Mother   . ADD / ADHD Neg Hx   . Anxiety disorder Neg Hx   . Drug abuse Neg Hx   . OCD Neg Hx   . Paranoid behavior Neg Hx   . Schizophrenia Neg Hx   . Seizures Neg Hx   . Physical abuse Neg Hx   . Sexual abuse Neg Hx   . Healthy Daughter     . Healthy Daughter   . Healthy Daughter      Prior to Admission medications   Medication Sig Start Date End Date Taking? Authorizing Provider  ALPRAZolam Duanne Moron) 0.5 MG tablet Take 0.5 mg by mouth See admin instructions. Take 1 tablet twice daily, on dialysis days take 1 tablet three times daily.   Yes Historical Provider, MD  amitriptyline (ELAVIL) 25 MG tablet Take 1 tablet (25 mg total) by mouth at bedtime. 07/06/13  Yes Levonne Spiller, MD  amLODipine (NORVASC) 10 MG tablet Take 1 tablet (10 mg total) by mouth daily. 01/24/11  Yes Asencion Noble, MD  atorvastatin (LIPITOR) 40 MG tablet Take 1 tablet by mouth daily. 03/06/13  Yes Historical Provider, MD  B Complex-C-Folic Acid (RENAL SOFTGELS PO) Take 1 capsule by mouth daily.   Yes Historical Provider, MD  cinacalcet (SENSIPAR) 30 MG tablet Take 30 mg by mouth daily.   Yes Historical Provider, MD  citalopram (CELEXA) 20 MG tablet Take 1 tablet (20 mg total) by mouth daily. 06/04/13  Yes Levonne Spiller, MD  diclofenac sodium (VOLTAREN) 1 % GEL Apply 2 g topically 4 (four) times daily.   Yes Historical Provider, MD  donepezil (ARICEPT) 5 MG tablet Take 5 mg by mouth at bedtime.  03/21/11  Yes Historical Provider, MD  gabapentin (NEURONTIN) 100 MG capsule Take 100 mg by mouth daily.   Yes Historical Provider, MD  ipratropium (ATROVENT) 0.02 % nebulizer solution Take 500 mcg by nebulization 4 (four) times daily as needed. Shortness of Breath 11/16/11  Yes Janice Norrie, MD  labetalol (NORMODYNE) 100 MG tablet Take 100 mg by mouth 2 (two) times daily.    Yes Historical Provider, MD  methocarbamol (ROBAXIN) 500 MG tablet TAKE (1) TABLET BY MOUTH FOUR TIMES DAILY. 07/10/12  Yes Darrol Jump, MD  oxyCODONE (OXY IR/ROXICODONE) 5 MG immediate release tablet Take 5 mg by mouth every 6 (six) hours as needed for moderate pain. pain 08/21/11  Yes Historical Provider, MD  pantoprazole (PROTONIX) 40 MG tablet Take 1 tablet (40 mg total) by mouth daily. 04/21/12  Yes Asencion Noble,  MD  risperiDONE (RISPERDAL) 0.5 MG tablet Take 2 tablets (1 mg total) by mouth at bedtime. 07/06/13  Yes Levonne Spiller, MD   Physical Exam: Filed Vitals:   08/12/13 1029 08/12/13 1030 08/12/13 1200 08/12/13 1230  BP: 127/83 117/81 123/83 115/68  Pulse: 135 133 137 74  Temp:      TempSrc:      Resp: _0 Height:      Weight:      SpO2: 95% 96% 95% 98%    Wt Readings from Last 3 Encounters:  08/12/13 101.152 kg (223 lb)  03/31/13 103.964 kg (229 lb 3.2 oz)  03/19/13 99.338 kg (219 lb)    General:  Appears calm and comfortable morbidly obese Eyes: PERRL,  normal lids, irises & conjunctiva ENT: Ears are clear nose without drainage oropharynx without erythema or exudate. Mucous membranes of her mouth are moist and pink Neck: no LAD, masses or thyromegaly Cardiovascular: Regular rate and rhythm +murmur trace lower extremity edema Telemetry: SR, no arrhythmias  Respiratory: CTA bilaterally, no w/r/r. Normal respiratory effort. Abdomen: soft, ntnd positive bowel sounds Skin: no rash or induration seen on limited exam Musculoskeletal: grossly normal tone BUE/BLE Psychiatric: grossly normal mood and affect, speech fluent and appropriate Neurologic: grossly non-focal.          Labs on Admission:  Basic Metabolic Panel:  Recent Labs Lab 08/12/13 0952  NA 135*  K 4.0  CL 94*  CO2 24  GLUCOSE 94  BUN 15  CREATININE 6.10*  CALCIUM 7.4*   Liver Function Tests: No results found for this basename: AST, ALT, ALKPHOS, BILITOT, PROT, ALBUMIN,  in the last 168 hours No results found for this basename: LIPASE, AMYLASE,  in the last 168 hours No results found for this basename: AMMONIA,  in the last 168 hours CBC:  Recent Labs Lab 08/12/13 0952  WBC 9.2  HGB 10.7*  HCT 33.4*  MCV 97.1  PLT 178   Cardiac Enzymes:  Recent Labs Lab 08/12/13 0952  TROPONINI <0.30    BNP (last 3 results)  Recent Labs  03/30/13 1021 08/12/13 0952  PROBNP 2732.0* 7825.0*    CBG: No results found for this basename: GLUCAP,  in the last 168 hours  Radiological Exams on Admission: Dg Chest Portable 1 View  08/12/2013   CLINICAL DATA:  Chest pain.  EXAM: PORTABLE CHEST - 1 VIEW  COMPARISON:  03/30/2013.  FINDINGS: Dialysis catheter noted with tip projected over superior vena cava. Cardiomegaly with normal pulmonary vascularity. Bilateral pulmonary interstitial prominence noted. No pleural effusion or pneumothorax. No acute bony abnormality.  IMPRESSION: 1. Dialysis catheter with tip projected over superior vena cava. 2. Congestive heart failure from interstitial edema .   Electronically Signed   By: Marcello Moores  Register   On: 08/12/2013 10:23    EKG: Independently reviewed. Sinus rhythm  Assessment/Plan Principal Problem:   Chest pain: Atypical. Will admit to tele for observation to rule out. Patient with a similar episode earlier this year. At that time she was evaluated by cardiology and underwent Lexis scan stress test yielding low risk without evidence of scar or ischemia. She was evaluated by cardiology in the emergency department today. Per their note evidence of paroxysmal atrial tachycardia or atypical atrial flutter per ECG as potential explanation for symptoms. Patient was provided with Cardizem as noted above. She was in sinus rhythm at the time of my exam. Will cycle troponins repeat an EKG in the morning. 2-D echo has been ordered by cardiology as well. Chart review indicates stress test from lower this year yields an ejection fraction of 67% . Provide nitroglycerin and antimanic as needed. Active Problems: PAT: Inverted to sinus rhythm after Cardizem bolus. Will obtain a TSH. Monitor on telemetry. See #1  Chronic kidney disease: On a Monday Wednesday Friday dialysis schedule: Dialysis was cut short today by about 30 minutes. Patient does not appear volume overloaded. Will request a renal consult just to ensure that extra dialysis is not required. Hopefully she  can be discharged tomorrow and resume her outpatient dialysis schedule   Hypertension: Controlled. Will continue home medications: Include Norvasc, labetalol. Monitor close   COPD (chronic obstructive pulmonary disease): Not on home oxygen. Appears to be stable at baseline. Will continue  her home medications and nebulizers.  Anemia: Likely related to chronic disease. Currently hemoglobin is 10. Chart review indicates this is very close to baseline. Will monitor  ESRD on dialysis: See above.    Morbid obesity: BMI 45.1. Nutritional consult    Anxiety and depression: Appears stable at baseline. Will continue her home medications which include: Xanax, amitriptyline, Celexa    GERD: Patient reports a large amount of flatulence lately. Will continue        Schizoaffective disorder: Stable at baseline. Continue home medications which include Risperdal    Chronic back pain greater than 3 months duration: Stable at baseline. Will continue her home pain regimen   Dr Domenic Polite cardiology Nephrology     Code Status: full DVT Prophylaxis: Family Communication: daughter and husband at bedside Disposition Plan: home hopefully tomorrow (indicate anticipated LOS)  Time spent: 40 minutes  [facility] Pager [phone]  **Disclaimer: This note may have been dictated with voice recognition software. Similar sounding words can inadvertently be transcribed and this note may contain transcription errors which may not have been corrected upon publication of note.**

## 2013-08-12 NOTE — Progress Notes (Signed)
*  PRELIMINARY RESULTS* Echocardiogram 2D Echocardiogram has been performed.  Jeryl ColumbiaLLIOTT, Amour Cutrone 08/12/2013, 4:11 PM

## 2013-08-12 NOTE — ED Notes (Addendum)
Pt to be transported to ICU stepdown after she finishes eating her lunch.

## 2013-08-12 NOTE — ED Notes (Addendum)
Pt. At dialysis, c/o chest tightness, and spasms to upper body, dialysis reports "looked like seizure activity" now pt. c/o back pain only , reports no n/v or sweating, pt is tachycardic at this time, given 2 nitro and ASA 324 in route, CBG 101 in route

## 2013-08-12 NOTE — ED Provider Notes (Signed)
CSN: [id number]     Arrival date & time 08/12/13  0973 History  This chart was scribed for Maudry Diego, MD by Peyton Bottoms, ED Scribe. This patient was seen in room [facility]14/[facility]14 and the patient's care was started at 9:43 AM.  Chief Complaint  Patient presents with  . Chest Pain   Patient is a 73 y.o. female presenting with chest pain. The history is provided by the patient. No language interpreter was used.  Chest Pain Pain radiates to:  Upper back Pain radiates to the back: yes   Pain severity:  Moderate Duration: this morning. Chronicity:  New Relieved by:  Aspirin Ineffective treatments:  Nitroglycerin Associated symptoms: back pain   Associated symptoms: no abdominal pain, no cough, no fatigue, no headache and no shortness of breath    HPI Comments: [name] is a 73 y.o. female who presents to the Emergency Department complaining of chest pain onset this morning 6:20am when she started dialysis. Patient states she was initially "choking" before chest pain began this morning, which was not relieved by Nitroglycerin. Patient states she has been doing dialysis for the past 2 years and has not had pain of similar severity in the past.  Patient reports associated upper back pain. Patient denies abdominal pain or shortness of breath. Per dialysis staff, she had possible seizure activity with spasms to upper body.   Past Medical History  Diagnosis Date  . Essential hypertension, benign   . Hypercholesterolemia   . GERD (gastroesophageal reflux disease)   . Asthma   . Osteoporosis   . ESRD (end stage renal disease) on dialysis   . Anemia of chronic disease   . Peripheral neuropathy   . Schizoaffective disorder   . Arthritis   . History of pneumonia    Past Surgical History  Procedure Laterality Date  . Tubal ligation    . Breast biopsy    . Refractive surgery      Right eye  . Bone marrow aspiration    . Bone marrow biopsy    . Insertion of dialysis catheter   01/23/2011    Procedure: INSERTION OF DIALYSIS CATHETER;  Surgeon: Angelia Mould, MD;  Location: [place];  Service: Vascular;  Laterality: N/A;  Insertion of Dialysis Catheter- Right Internal Jugular  . Yag laser application  53/29/9242    Procedure: YAG LASER APPLICATION;  Surgeon: Elta Guadeloupe T. Gershon Crane, MD;  Location: [facility];  Service: Ophthalmology;  Laterality: Left;   Family History  Problem Relation Age of Onset  . Alcohol abuse Brother   . Bipolar disorder Brother   . Dementia Mother   . Depression Mother   . ADD / ADHD Neg Hx   . Anxiety disorder Neg Hx   . Drug abuse Neg Hx   . OCD Neg Hx   . Paranoid behavior Neg Hx   . Schizophrenia Neg Hx   . Seizures Neg Hx   . Physical abuse Neg Hx   . Sexual abuse Neg Hx   . Healthy Daughter   . Healthy Daughter   . Healthy Daughter    History  Substance Use Topics  . Smoking status: Former Smoker    Types: Cigarettes  . Smokeless tobacco: Not on file  . Alcohol Use: No   OB History   Grav Para Term Preterm Abortions TAB SAB Ect Mult Living                 Review of Systems  Constitutional: Negative  for appetite change and fatigue.  HENT: Negative for congestion, ear discharge and sinus pressure.   Eyes: Negative for discharge.  Respiratory: Negative for cough and shortness of breath.   Cardiovascular: Positive for chest pain.  Gastrointestinal: Negative for abdominal pain and diarrhea.  Genitourinary: Negative for frequency and hematuria.  Musculoskeletal: Positive for back pain.  Skin: Negative for rash.  Neurological: Positive for seizures. Negative for headaches.  Psychiatric/Behavioral: Negative for hallucinations.    Allergies  Neurontin; Ativan; Cymbalta; and Ferrous gluconate  Home Medications   Prior to Admission medications   Medication Sig Start Date End Date Taking? Authorizing Provider  atorvastatin (LIPITOR) 40 MG tablet Take 1 tablet by mouth daily. 03/06/13  Yes Historical Provider, MD  citalopram  (CELEXA) 20 MG tablet Take 1 tablet (20 mg total) by mouth daily. 06/04/13  Yes Levonne Spiller, MD  diclofenac sodium (VOLTAREN) 1 % GEL Apply 2 g topically 4 (four) times daily.   Yes Historical Provider, MD  donepezil (ARICEPT) 5 MG tablet Take 5 mg by mouth at bedtime.  03/21/11  Yes Historical Provider, MD  gabapentin (NEURONTIN) 100 MG capsule Take 100 mg by mouth daily.   Yes Historical Provider, MD  labetalol (NORMODYNE) 100 MG tablet Take 100 mg by mouth 2 (two) times daily.    Yes Historical Provider, MD  methocarbamol (ROBAXIN) 500 MG tablet TAKE (1) TABLET BY MOUTH FOUR TIMES DAILY. 07/10/12  Yes Darrol Jump, MD  pantoprazole (PROTONIX) 40 MG tablet Take 1 tablet (40 mg total) by mouth daily. 04/21/12  Yes Asencion Noble, MD  amitriptyline (ELAVIL) 25 MG tablet Take 1 tablet (25 mg total) by mouth at bedtime. 07/06/13   Levonne Spiller, MD  amLODipine (NORVASC) 10 MG tablet Take 1 tablet (10 mg total) by mouth daily. 01/24/11 03/30/13  Asencion Noble, MD  ipratropium (ATROVENT) 0.02 % nebulizer solution Take 500 mcg by nebulization 4 (four) times daily as needed. Shortness of Breath 11/16/11   Janice Norrie, MD  oxyCODONE (OXY IR/ROXICODONE) 5 MG immediate release tablet Take 5 mg by mouth 3 (three) times daily as needed. pain 08/21/11   Historical Provider, MD  risperiDONE (RISPERDAL) 0.5 MG tablet Take 2 tablets (1 mg total) by mouth at bedtime. 07/06/13   Levonne Spiller, MD   Triage Vitals: BP 138/99  Pulse 140  Temp(Src) 98.2 F (36.8 C) (Oral)  Resp 16  Ht $R'4\' 11"'Wf$  (1.499 m)  Wt 223 lb (101.152 kg)  BMI 45.02 kg/m2  SpO2 95%  Physical Exam  Nursing note and vitals reviewed. Constitutional: She is oriented to person, place, and time. She appears well-developed.  HENT:  Head: Normocephalic.  Eyes: Conjunctivae and EOM are normal. No scleral icterus.  Neck: Neck supple. No thyromegaly present.  Cardiovascular: Regular rhythm.  Tachycardia present.  Exam reveals no gallop and no friction rub.   No murmur  heard. Pulmonary/Chest: No stridor. She has no wheezes. She has no rales. She exhibits no tenderness.  Abdominal: She exhibits no distension. There is no tenderness. There is no rebound.  Musculoskeletal: Normal range of motion. She exhibits edema and tenderness.  +1 Edema in ankles Tenderness in mid-thoracic spine  Lymphadenopathy:    She has no cervical adenopathy.  Neurological: She is oriented to person, place, and time. She exhibits normal muscle tone. Coordination normal.  Skin: No rash noted. No erythema.  Psychiatric: She has a normal mood and affect. Her behavior is normal.    ED Course  Procedures (including critical care time)  DIAGNOSTIC STUDIES: Oxygen Saturation is 95% on RA, normal by my interpretation.    COORDINATION OF CARE: 9:45 AM- Discussed plans to order diagnostic lab work and CXR.  Will give Percocet and Xanax. Pt advised of plan for treatment and pt agrees.  11:13 AM - On recheck, patient still complains of pain in her upper back and chest. Discussed plan of treatment with patient and possible admission and patient agrees to plan.  11:40 AM- Patient currently denies pain after receiving 2 doses of nitroglycerine. Planned to consult with pt's PCP Dr. Willey Blade and admit here or to [facility].    Labs Review Labs Reviewed  CBC - Abnormal; Notable for the following:    RBC 3.44 (*)    Hemoglobin 10.7 (*)    HCT 33.4 (*)    RDW 15.8 (*)    All other components within normal limits  BASIC METABOLIC PANEL - Abnormal; Notable for the following:    Sodium 135 (*)    Chloride 94 (*)    Creatinine, Ser 6.10 (*)    Calcium 7.4 (*)    GFR calc non Af Amer 6 (*)    GFR calc Af Amer 7 (*)    Anion gap 17 (*)    All other components within normal limits  PRO B NATRIURETIC PEPTIDE - Abnormal; Notable for the following:    Pro B Natriuretic peptide (BNP) 7825.0 (*)    All other components within normal limits  TROPONIN I    Imaging Review Dg Chest Portable 1  View  08/12/2013   CLINICAL DATA:  Chest pain.  EXAM: PORTABLE CHEST - 1 VIEW  COMPARISON:  03/30/2013.  FINDINGS: Dialysis catheter noted with tip projected over superior vena cava. Cardiomegaly with normal pulmonary vascularity. Bilateral pulmonary interstitial prominence noted. No pleural effusion or pneumothorax. No acute bony abnormality.  IMPRESSION: 1. Dialysis catheter with tip projected over superior vena cava. 2. Congestive heart failure from interstitial edema .   Electronically Signed   By: Marcello Moores  Register   On: 08/12/2013 10:23     EKG Interpretation None     CRITICAL CARE Performed by: Katiejo Gilroy L Total critical care time:40 Critical care time was exclusive of separately billable procedures and treating other patients. Critical care was necessary to treat or prevent imminent or life-threatening deterioration. Critical care was time spent personally by me on the following activities: development of treatment plan with patient and/or surrogate as well as nursing, discussions with consultants, evaluation of patient's response to treatment, examination of patient, obtaining history from patient or surrogate, ordering and performing treatments and interventions, ordering and review of laboratory studies, ordering and review of radiographic studies, pulse oximetry and re-evaluation of patient's condition.  Cardiology consulted.   Pt given cardiazem and she converted to nsr at 47      MDM   Final diagnoses:  None    Admit.   The chart was scribed for me under my direct supervision.  I personally performed the history, physical, and medical decision making and all procedures in the evaluation of this patient.Maudry Diego, MD 08/12/13 [phone]

## 2013-08-12 NOTE — H&P (Signed)
Attending attestation  I have seen this patient independently, discussed POC with MID-level provider and patient and ammended note and plan  where needed-see below  Heather KochJai Tatumn Corbridge, MD [facility] [phone]([phone]    73 y/o ?, h/o ESRD mwf : [place]-complicated access history-AV fistula clotted within first year [facility] and then temporary access port placed in left chest[supposed to be removed tomorrow by Dr. Elby BeckJenkins]-new axis left hand-daughter reports try weight is 95 kg over the past one to 2 years she has gained weight and is currently 98.5 as her dry weight--her admission weight was 101.2 kg She was recently admit [facility] 04/01/13 with neg nuc med scan EF 67% negative cardiac markers then discharged home, She carries strong history of GERD/nausea which sometimes occurs after her dialysis sessions and presented from her dialysis center in a similar fashion 7/29/15from dialysis center with chest tightness, found to have SVT/aberrant A. flutter and given Cardizem IV as per cardiology instructions   She also has htn, COPD, Schizoaff disorder, AoRD/AoCD,   Labs on admission = sodium 135 chloride 94 BUN 15 creatinine 6.1 and 17, proBNP 7825 baseline 2000 Portable chest x-ray congestive heart failure with interstitial edema  Echocardiogram EF 60-65% grade 1 diastolic dysfunction mildly sclerotic aortic valve, no regional wall motion abnormalities   Filed Vitals:   08/12/13 1509  BP: 167/79  Pulse: 81  Temp: 97.6 F (36.4 C)  Resp: 17    She currently feels fair but had 5-7 episodes recurrent "spasms"of her body wherein she had jerking movements not related chest pain. She states that when she belches to nausea from dialysis feels better. Her daughter voices concern about her being on chronic Lasix yet she has no urine output anymore  Alert pleasant oriented no apparent distress Morbid obesity, Body mass index is 45.04 kg/(m^2). Chest  clinically clear Axis in chest seems clean Significant scarring to left upper extremity Axis in left lower arm is taped and bandaged Abdomen soft nontender nondistended obese No lower extremity edema  Cardiology to comment on need for invasive workup depending on troponins and nature of pain which agreeably sounds non-angina-like however unclear why this would precipitate SVT-I suppose that she has surreptitiously gained weight and volume and may have had either flash pulmonary edema or another reason for her SVT.  Agree with TSH. She is an anxious lady at baseline and probably should be on limited amounts of albuterol which can precipitate tachyarrhythmia. She will continue her home doses of labetalol 100 twice a day and amlodipine however labetalol has a pretty short half-life and this may need to be changed. As she is not having any current chest pain and is having a headache and discontinued her nitroglycerin patch which can obviously be restarted if needed.  Underlying her other problems she has GERD and nausea after dialysis I am not sure if there is any hemodynamic instability with shift of electrolytes during dialysis that is causing her nausea. She bleaches needs to have something to eat or at least take her PPI early on the mornings of dialysis.   She is on multiple SSRI/anxiolytic medications including Risperdal one each bedtime, Aricept 5 each bedtime, citalopram 20 daily, amitriptyline 25 each bedtime, which should be tapered and monitored closely by her primary care physician.  Appreciate cardiology as well as nephrology input  Dr. Ouida SillsFagan to take over management in the morning  Heather KochJai Jordan Pardini, MD [facility] [phone]) [phone]

## 2013-08-12 NOTE — Progress Notes (Signed)
error 

## 2013-08-13 DIAGNOSIS — I120 Hypertensive chronic kidney disease with stage 5 chronic kidney disease or end stage renal disease: Secondary | ICD-10-CM | POA: Diagnosis not present

## 2013-08-13 DIAGNOSIS — R079 Chest pain, unspecified: Secondary | ICD-10-CM | POA: Diagnosis not present

## 2013-08-13 DIAGNOSIS — M546 Pain in thoracic spine: Secondary | ICD-10-CM | POA: Diagnosis not present

## 2013-08-13 DIAGNOSIS — N186 End stage renal disease: Secondary | ICD-10-CM | POA: Diagnosis not present

## 2013-08-13 LAB — CBC
HEMATOCRIT: 31.1 % — AB (ref 36.0–46.0)
HEMOGLOBIN: 9.9 g/dL — AB (ref 12.0–15.0)
MCH: 31.1 pg (ref 26.0–34.0)
MCHC: 31.8 g/dL (ref 30.0–36.0)
MCV: 97.8 fL (ref 78.0–100.0)
Platelets: 169 10*3/uL (ref 150–400)
RBC: 3.18 MIL/uL — AB (ref 3.87–5.11)
RDW: 15.6 % — ABNORMAL HIGH (ref 11.5–15.5)
WBC: 7.9 10*3/uL (ref 4.0–10.5)

## 2013-08-13 LAB — COMPREHENSIVE METABOLIC PANEL
ALT: 14 U/L (ref 0–35)
AST: 22 U/L (ref 0–37)
Albumin: 2.9 g/dL — ABNORMAL LOW (ref 3.5–5.2)
Alkaline Phosphatase: 56 U/L (ref 39–117)
Anion gap: 15 (ref 5–15)
BUN: 23 mg/dL (ref 6–23)
CO2: 23 meq/L (ref 19–32)
CREATININE: 8.25 mg/dL — AB (ref 0.50–1.10)
Calcium: 6.4 mg/dL — CL (ref 8.4–10.5)
Chloride: 95 mEq/L — ABNORMAL LOW (ref 96–112)
GFR, EST AFRICAN AMERICAN: 5 mL/min — AB (ref >90)
GFR, EST NON AFRICAN AMERICAN: 4 mL/min — AB (ref >90)
GLUCOSE: 117 mg/dL — AB (ref 70–99)
Potassium: 4.2 mEq/L (ref 3.7–5.3)
Sodium: 133 mEq/L — ABNORMAL LOW (ref 137–147)
Total Bilirubin: 0.3 mg/dL (ref 0.3–1.2)
Total Protein: 6.7 g/dL (ref 6.0–8.3)

## 2013-08-13 LAB — TROPONIN I
Troponin I: <0.3 ng/mL (ref <0.30)
Troponin I: <0.3 ng/mL (ref <0.30)

## 2013-08-13 LAB — TSH
TSH: 1.75 u[IU]/mL (ref 0.350–4.500)
TSH: 1.68 u[IU]/mL (ref 0.350–4.500)

## 2013-08-13 MED ORDER — EPOETIN ALFA 10000 UNIT/ML IJ SOLN
10000.0000 [IU] | INTRAMUSCULAR | Status: DC
  Administered 2013-08-14: 10000 [IU] via INTRAVENOUS
  Filled 2013-08-13 (×2): qty 1

## 2013-08-13 NOTE — Progress Notes (Signed)
CRITICAL VALUE ALERT  Critical value received:  Calcium=6.4  Date of notification:  08/13/13  Time of notification:  0650  Critical value read back:Yes.    Nurse who received alert:  Jinny Sandersisha Natausha Jungwirth, RN  MD notified (1st page):  Dr. Sharl MaLama  Time of first page:  08/13/13  MD notified (2nd page):  Time of second page:  Responding MD:  Dr. Sharl MaLama  Time MD responded:  [phone] No new orders given at this time.

## 2013-08-13 NOTE — Consult Note (Signed)
Reason for Consult: End-stage renal disease Referring Physician: Dr. Pasty Spillers Heather [name] is an 73 y.o. female.  HPI: She is a patient who has history of her hypertension, depression, incisional is on maintenance hemodialysis presently was admitted because of chest pain. Patient was seen on dialysis yesterday without any problem. However if her she finished her dialysis patient is her complaining of chest pain described as tightness and choking. Patient was given some nitroglycerin without any relief of pain. Her exact time patient also become tachycardic hence sent to emergency room where she was found possible atrial fibrillation/flutter. Presently she denies any nausea, vomiting, chest pain or difficulty breathing. Patient states that the pain started yesterday before she came to dialysis and was described as epigastric pain which improved.  Past Medical History  Diagnosis Date  . Essential hypertension, benign   . Hypercholesterolemia   . GERD (gastroesophageal reflux disease)   . Asthma   . Osteoporosis   . ESRD (end stage renal disease) on dialysis   . Anemia of chronic disease   . Peripheral neuropathy   . Schizoaffective disorder   . Arthritis   . History of pneumonia     Past Surgical History  Procedure Laterality Date  . Tubal ligation    . Breast biopsy    . Refractive surgery      Right eye  . Bone marrow aspiration    . Bone marrow biopsy    . Insertion of dialysis catheter  01/23/2011    Procedure: INSERTION OF DIALYSIS CATHETER;  Surgeon: Angelia Mould, MD;  Location: [place];  Service: Vascular;  Laterality: N/A;  Insertion of Dialysis Catheter- Right Internal Jugular  . Yag laser application  94/80/1655    Procedure: YAG LASER APPLICATION;  Surgeon: Elta Guadeloupe T. Gershon Crane, MD;  Location: [facility];  Service: Ophthalmology;  Laterality: Left;    Family History  Problem Relation Age of Onset  . Alcohol abuse Brother   . Bipolar disorder Brother   . Dementia Mother    . Depression Mother   . ADD / ADHD Neg Hx   . Anxiety disorder Neg Hx   . Drug abuse Neg Hx   . OCD Neg Hx   . Paranoid behavior Neg Hx   . Schizophrenia Neg Hx   . Seizures Neg Hx   . Physical abuse Neg Hx   . Sexual abuse Neg Hx   . Healthy Daughter   . Healthy Daughter   . Healthy Daughter     Social History:  reports that she has quit smoking. Her smoking use included Cigarettes. She smoked 0.00 packs per day. She does not have any smokeless tobacco history on file. She reports that she does not drink alcohol or use illicit drugs.  Allergies:  Allergies  Allergen Reactions  . Neurontin [Gabapentin] Other (See Comments)    Had about every known side effect with $RemoveBef'300mg'kqwaYyNayC$  three a day on this. DO NOT TRY THIS AGAIN!  . Ativan [Lorazepam] Nausea Only and Other (See Comments)    Disoriented, not feel like herself   . Cymbalta [Duloxetine Hcl] Other (See Comments)    Noted hallucinations this, but her primary diagnosis is schizophrenia   . Ferrous Gluconate Nausea Only    hypotension    Medications: I have reviewed the patient'[name] current medications.  Results for orders placed during the hospital encounter of 08/12/13 (from the past 48 hour([name]))  CBC     Status: Abnormal   Collection Time  08/12/13  9:52 AM      Result Value Ref Range   WBC 9.2  4.0 - 10.5 K/uL   RBC 3.44 (*) 3.87 - 5.11 MIL/uL   Hemoglobin 10.7 (*) 12.0 - 15.0 g/dL   HCT 73.0 (*) 85.6 - 94.3 %   MCV 97.1  78.0 - 100.0 fL   MCH 31.1  26.0 - 34.0 pg   MCHC 32.0  30.0 - 36.0 g/dL   RDW 70.0 (*) 52.5 - 91.0 %   Platelets 178  150 - 400 K/uL  BASIC METABOLIC PANEL     Status: Abnormal   Collection Time    08/12/13  9:52 AM      Result Value Ref Range   Sodium 135 (*) 137 - 147 mEq/L   Potassium 4.0  3.7 - 5.3 mEq/L   Chloride 94 (*) 96 - 112 mEq/L   CO2 24  19 - 32 mEq/L   Glucose, Bld 94  70 - 99 mg/dL   BUN 15  6 - 23 mg/dL   Creatinine, Ser 2.89 (*) 0.50 - 1.10 mg/dL   Calcium 7.4 (*) 8.4 - 10.5  mg/dL   GFR calc non Af Amer 6 (*) >90 mL/min   GFR calc Af Amer 7 (*) >90 mL/min   Comment: (NOTE)     The eGFR has been calculated using the CKD EPI equation.     This calculation has not been validated in all clinical situations.     eGFR'[name] persistently <90 mL/min signify possible Chronic Kidney     Disease.   Anion gap 17 (*) 5 - 15  TROPONIN I     Status: None   Collection Time    08/12/13  9:52 AM      Result Value Ref Range   Troponin I <0.30  <0.30 ng/mL   Comment:            Due to the release kinetics of cTnI,     a negative result within the first hours     of the onset of symptoms does not rule out     myocardial infarction with certainty.     If myocardial infarction is still suspected,     repeat the test at appropriate intervals.  PRO B NATRIURETIC PEPTIDE     Status: Abnormal   Collection Time    08/12/13  9:52 AM      Result Value Ref Range   Pro B Natriuretic peptide (BNP) 7825.0 (*) 0 - 125 pg/mL  TSH     Status: None   Collection Time    08/12/13  9:52 AM      Result Value Ref Range   TSH 1.750  0.350 - 4.500 uIU/mL   Comment: Performed at [facility]  MRSA PCR SCREENING     Status: None   Collection Time    08/12/13  3:45 PM      Result Value Ref Range   MRSA by PCR NEGATIVE  NEGATIVE   Comment:            The GeneXpert MRSA Assay (FDA     approved for NASAL specimens     only), is one component of a     comprehensive MRSA colonization     surveillance program. It is not     intended to diagnose MRSA     infection nor to guide or     monitor treatment for     MRSA infections.  TROPONIN  I     Status: None   Collection Time    08/12/13  5:13 PM      Result Value Ref Range   Troponin I <0.30  <0.30 ng/mL   Comment:            Due to the release kinetics of cTnI,     a negative result within the first hours     of the onset of symptoms does not rule out     myocardial infarction with certainty.     If myocardial infarction is still  suspected,     repeat the test at appropriate intervals.  TROPONIN I     Status: None   Collection Time    08/12/13 11:10 PM      Result Value Ref Range   Troponin I <0.30  <0.30 ng/mL   Comment:            Due to the release kinetics of cTnI,     a negative result within the first hours     of the onset of symptoms does not rule out     myocardial infarction with certainty.     If myocardial infarction is still suspected,     repeat the test at appropriate intervals.  CBC     Status: Abnormal   Collection Time    08/13/13  5:07 AM      Result Value Ref Range   WBC 7.9  4.0 - 10.5 K/uL   RBC 3.18 (*) 3.87 - 5.11 MIL/uL   Hemoglobin 9.9 (*) 12.0 - 15.0 g/dL   HCT 31.1 (*) 36.0 - 46.0 %   MCV 97.8  78.0 - 100.0 fL   MCH 31.1  26.0 - 34.0 pg   MCHC 31.8  30.0 - 36.0 g/dL   RDW 15.6 (*) 11.5 - 15.5 %   Platelets 169  150 - 400 K/uL  COMPREHENSIVE METABOLIC PANEL     Status: Abnormal   Collection Time    08/13/13  5:07 AM      Result Value Ref Range   Sodium 133 (*) 137 - 147 mEq/L   Potassium 4.2  3.7 - 5.3 mEq/L   Chloride 95 (*) 96 - 112 mEq/L   CO2 23  19 - 32 mEq/L   Glucose, Bld 117 (*) 70 - 99 mg/dL   BUN 23  6 - 23 mg/dL   Creatinine, Ser 8.25 (*) 0.50 - 1.10 mg/dL   Calcium 6.4 (*) 8.4 - 10.5 mg/dL   Comment: CRITICAL RESULT CALLED TO, READ BACK BY AND VERIFIED WITH:     BULLOCK,T AT 6:50AM ON 08/13/13 BY FESTERMAN,C   Total Protein 6.7  6.0 - 8.3 g/dL   Albumin 2.9 (*) 3.5 - 5.2 g/dL   AST 22  0 - 37 U/L   ALT 14  0 - 35 U/L   Alkaline Phosphatase 56  39 - 117 U/L   Total Bilirubin 0.3  0.3 - 1.2 mg/dL   GFR calc non Af Amer 4 (*) >90 mL/min   GFR calc Af Amer 5 (*) >90 mL/min   Comment: (NOTE)     The eGFR has been calculated using the CKD EPI equation.     This calculation has not been validated in all clinical situations.     eGFR'[name] persistently <90 mL/min signify possible Chronic Kidney     Disease.   Anion gap 15  5 - 15  TROPONIN I     Status: None  Collection Time    08/13/13  5:07 AM      Result Value Ref Range   Troponin I <0.30  <0.30 ng/mL   Comment:            Due to the release kinetics of cTnI,     a negative result within the first hours     of the onset of symptoms does not rule out     myocardial infarction with certainty.     If myocardial infarction is still suspected,     repeat the test at appropriate intervals.    Dg Chest Portable 1 View  08/12/2013   CLINICAL DATA:  Chest pain.  EXAM: PORTABLE CHEST - 1 VIEW  COMPARISON:  03/30/2013.  FINDINGS: Dialysis catheter noted with tip projected over superior vena cava. Cardiomegaly with normal pulmonary vascularity. Bilateral pulmonary interstitial prominence noted. No pleural effusion or pneumothorax. No acute bony abnormality.  IMPRESSION: 1. Dialysis catheter with tip projected over superior vena cava. 2. Congestive heart failure from interstitial edema .   Electronically Signed   By: Marcello Moores  Register   On: 08/12/2013 10:23    Review of Systems  Constitutional: Negative for chills.  Respiratory: Negative for shortness of breath.   Cardiovascular: Positive for chest pain and palpitations. Negative for leg swelling and PND.  Gastrointestinal: Positive for heartburn. Negative for nausea, vomiting and abdominal pain.  Musculoskeletal: Positive for back pain.  Neurological: Positive for weakness.   Blood pressure 110/48, pulse 64, temperature 98.3 F (36.8 C), temperature source Oral, resp. rate 20, height $RemoveBe'4\' 11"'mGJoBYTOB$  (1.499 m), weight 103 kg (227 lb 1.2 oz), SpO2 98.00%. Physical Exam  Constitutional: She is oriented to person, place, and time. No distress.  Eyes: No scleral icterus.  Neck: No JVD present.  Cardiovascular: Normal rate and regular rhythm.   Respiratory: No respiratory distress. She has no wheezes.  GI: She exhibits no distension. There is no tenderness.  Musculoskeletal: She exhibits no edema.  Neurological: She is alert and oriented to person, place, and  time.    Assessment/Plan: Problem #1 end-stage renal disease: She status post hemodialysis yesterday for about 3 hours. Presently patient does not have any nausea or vomiting. Problem #2 tachycardia: Presently patient is seen by cardiology and workup was in progress. Her heart is seems to be fluctuating but she doesn't have any chest pain. Problem #3 hypertension: Recently her blood pressure tends to be low and because of that dose of labetalol has been reduced. Patient also does not take medication when she comes to dialysis. Problem #4 anemia: Her hemoglobin hematocrit is range Problem #5 history of depression/scho affective disorder Problem #6 history of GERD Problem #7 history of chronic back pain Problem #8 history of COPD Plan: We'll make arrangements for patient to get dialysis tomorrow which is her regular dialysis today. We'll continue his other treatment and we'll check her basic metabolic panel and phosphorus in the morning. Weakness Epogen he 10,000 units IV after each dialysis.  Heather [name] 08/13/2013, 7:26 AM

## 2013-08-13 NOTE — Progress Notes (Signed)
Utilization Review Completed.Alliene Klugh T7/30/2015  

## 2013-08-13 NOTE — Progress Notes (Signed)
Subjective: She feels better. She has no chest pain. Help from consultants is noted and appreciated. She still had some elevation of heart rate in the last 12 hours or so she has started amiodarone.  Objective: Vital signs in last 24 hours: Temp:  [97.6 F (36.4 C)-98.4 F (36.9 C)] 98.3 F (36.8 C) (07/30 0621) Pulse Rate:  [64-82] 67 (07/30 0931) Resp:  [11-20] 20 (07/30 0621) BP: (109-167)/(48-92) 141/86 mmHg (07/30 0931) SpO2:  [98 %-100 %] 98 % (07/30 0621) Weight:  [99.791 kg (220 lb)-103 kg (227 lb 1.2 oz)] 103 kg (227 lb 1.2 oz) (07/30 0814) Weight change:  Last BM Date: 08/12/13  Intake/Output from previous day:    PHYSICAL EXAM General appearance: alert, cooperative, no distress and morbidly obese Resp: clear to auscultation bilaterally Cardio: regular rate and rhythm, S1, S2 normal, no murmur, click, rub or gallop GI: soft, non-tender; bowel sounds normal; no masses,  no organomegaly Extremities: extremities normal, atraumatic, no cyanosis or edema  Lab Results:  Results for orders placed during the hospital encounter of 08/12/13 (from the past 48 hour(s))  CBC     Status: Abnormal   Collection Time    08/12/13  9:52 AM      Result Value Ref Range   WBC 9.2  4.0 - 10.5 K/uL   RBC 3.44 (*) 3.87 - 5.11 MIL/uL   Hemoglobin 10.7 (*) 12.0 - 15.0 g/dL   HCT 33.4 (*) 36.0 - 46.0 %   MCV 97.1  78.0 - 100.0 fL   MCH 31.1  26.0 - 34.0 pg   MCHC 32.0  30.0 - 36.0 g/dL   RDW 15.8 (*) 11.5 - 15.5 %   Platelets 178  150 - 400 K/uL  BASIC METABOLIC PANEL     Status: Abnormal   Collection Time    08/12/13  9:52 AM      Result Value Ref Range   Sodium 135 (*) 137 - 147 mEq/L   Potassium 4.0  3.7 - 5.3 mEq/L   Chloride 94 (*) 96 - 112 mEq/L   CO2 24  19 - 32 mEq/L   Glucose, Bld 94  70 - 99 mg/dL   BUN 15  6 - 23 mg/dL   Creatinine, Ser 6.10 (*) 0.50 - 1.10 mg/dL   Calcium 7.4 (*) 8.4 - 10.5 mg/dL   GFR calc non Af Amer 6 (*) >90 mL/min   GFR calc Af Amer 7 (*) >90  mL/min   Comment: (NOTE)     The eGFR has been calculated using the CKD EPI equation.     This calculation has not been validated in all clinical situations.     eGFR's persistently <90 mL/min signify possible Chronic Kidney     Disease.   Anion gap 17 (*) 5 - 15  TROPONIN I     Status: None   Collection Time    08/12/13  9:52 AM      Result Value Ref Range   Troponin I <0.30  <0.30 ng/mL   Comment:            Due to the release kinetics of cTnI,     a negative result within the first hours     of the onset of symptoms does not rule out     myocardial infarction with certainty.     If myocardial infarction is still suspected,     repeat the test at appropriate intervals.  PRO B NATRIURETIC PEPTIDE  Status: Abnormal   Collection Time    08/12/13  9:52 AM      Result Value Ref Range   Pro B Natriuretic peptide (BNP) 7825.0 (*) 0 - 125 pg/mL  TSH     Status: None   Collection Time    08/12/13  9:52 AM      Result Value Ref Range   TSH 1.750  0.350 - 4.500 uIU/mL   Comment: Performed at [facility]  MRSA PCR SCREENING     Status: None   Collection Time    08/12/13  3:45 PM      Result Value Ref Range   MRSA by PCR NEGATIVE  NEGATIVE   Comment:            The GeneXpert MRSA Assay (FDA     approved for NASAL specimens     only), is one component of a     comprehensive MRSA colonization     surveillance program. It is not     intended to diagnose MRSA     infection nor to guide or     monitor treatment for     MRSA infections.  TROPONIN I     Status: None   Collection Time    08/12/13  5:13 PM      Result Value Ref Range   Troponin I <0.30  <0.30 ng/mL   Comment:            Due to the release kinetics of cTnI,     a negative result within the first hours     of the onset of symptoms does not rule out     myocardial infarction with certainty.     If myocardial infarction is still suspected,     repeat the test at appropriate intervals.  TROPONIN I      Status: None   Collection Time    08/12/13 11:10 PM      Result Value Ref Range   Troponin I <0.30  <0.30 ng/mL   Comment:            Due to the release kinetics of cTnI,     a negative result within the first hours     of the onset of symptoms does not rule out     myocardial infarction with certainty.     If myocardial infarction is still suspected,     repeat the test at appropriate intervals.  CBC     Status: Abnormal   Collection Time    08/13/13  5:07 AM      Result Value Ref Range   WBC 7.9  4.0 - 10.5 K/uL   RBC 3.18 (*) 3.87 - 5.11 MIL/uL   Hemoglobin 9.9 (*) 12.0 - 15.0 g/dL   HCT 31.1 (*) 36.0 - 46.0 %   MCV 97.8  78.0 - 100.0 fL   MCH 31.1  26.0 - 34.0 pg   MCHC 31.8  30.0 - 36.0 g/dL   RDW 15.6 (*) 11.5 - 15.5 %   Platelets 169  150 - 400 K/uL  COMPREHENSIVE METABOLIC PANEL     Status: Abnormal   Collection Time    08/13/13  5:07 AM      Result Value Ref Range   Sodium 133 (*) 137 - 147 mEq/L   Potassium 4.2  3.7 - 5.3 mEq/L   Chloride 95 (*) 96 - 112 mEq/L   CO2 23  19 - 32 mEq/L   Glucose, Bld  117 (*) 70 - 99 mg/dL   BUN 23  6 - 23 mg/dL   Creatinine, Ser 8.25 (*) 0.50 - 1.10 mg/dL   Calcium 6.4 (*) 8.4 - 10.5 mg/dL   Comment: CRITICAL RESULT CALLED TO, READ BACK BY AND VERIFIED WITH:     BULLOCK,T AT 6:50AM ON 08/13/13 BY FESTERMAN,C   Total Protein 6.7  6.0 - 8.3 g/dL   Albumin 2.9 (*) 3.5 - 5.2 g/dL   AST 22  0 - 37 U/L   ALT 14  0 - 35 U/L   Alkaline Phosphatase 56  39 - 117 U/L   Total Bilirubin 0.3  0.3 - 1.2 mg/dL   GFR calc non Af Amer 4 (*) >90 mL/min   GFR calc Af Amer 5 (*) >90 mL/min   Comment: (NOTE)     The eGFR has been calculated using the CKD EPI equation.     This calculation has not been validated in all clinical situations.     eGFR's persistently <90 mL/min signify possible Chronic Kidney     Disease.   Anion gap 15  5 - 15  TROPONIN I     Status: None   Collection Time    08/13/13  5:07 AM      Result Value Ref Range    Troponin I <0.30  <0.30 ng/mL   Comment:            Due to the release kinetics of cTnI,     a negative result within the first hours     of the onset of symptoms does not rule out     myocardial infarction with certainty.     If myocardial infarction is still suspected,     repeat the test at appropriate intervals.  TROPONIN I     Status: None   Collection Time    08/13/13 10:41 AM      Result Value Ref Range   Troponin I <0.30  <0.30 ng/mL   Comment:            Due to the release kinetics of cTnI,     a negative result within the first hours     of the onset of symptoms does not rule out     myocardial infarction with certainty.     If myocardial infarction is still suspected,     repeat the test at appropriate intervals.    ABGS No results found for this basename: PHART, PCO2, PO2ART, TCO2, HCO3,  in the last 72 hours CULTURES Recent Results (from the past 240 hour(s))  MRSA PCR SCREENING     Status: None   Collection Time    08/12/13  3:45 PM      Result Value Ref Range Status   MRSA by PCR NEGATIVE  NEGATIVE Final   Comment:            The GeneXpert MRSA Assay (FDA     approved for NASAL specimens     only), is one component of a     comprehensive MRSA colonization     surveillance program. It is not     intended to diagnose MRSA     infection nor to guide or     monitor treatment for     MRSA infections.   Studies/Results: Dg Chest Portable 1 View  08/12/2013   CLINICAL DATA:  Chest pain.  EXAM: PORTABLE CHEST - 1 VIEW  COMPARISON:  03/30/2013.  FINDINGS: Dialysis catheter noted with  tip projected over superior vena cava. Cardiomegaly with normal pulmonary vascularity. Bilateral pulmonary interstitial prominence noted. No pleural effusion or pneumothorax. No acute bony abnormality.  IMPRESSION: 1. Dialysis catheter with tip projected over superior vena cava. 2. Congestive heart failure from interstitial edema .   Electronically Signed   By: Marcello Moores  Register   On:  08/12/2013 10:23    Medications:  Prior to Admission:  Prescriptions prior to admission  Medication Sig Dispense Refill  . ALPRAZolam (XANAX) 0.5 MG tablet Take 0.5 mg by mouth See admin instructions. Take 1 tablet twice daily, on dialysis days take 1 tablet three times daily.      Marland Kitchen amitriptyline (ELAVIL) 25 MG tablet Take 1 tablet (25 mg total) by mouth at bedtime.  30 tablet  2  . amLODipine (NORVASC) 10 MG tablet Take 1 tablet (10 mg total) by mouth daily.  30 tablet  12  . atorvastatin (LIPITOR) 40 MG tablet Take 1 tablet by mouth daily.      . B Complex-C-Folic Acid (RENAL SOFTGELS PO) Take 1 capsule by mouth daily.      . cinacalcet (SENSIPAR) 30 MG tablet Take 30 mg by mouth daily.      . citalopram (CELEXA) 20 MG tablet Take 1 tablet (20 mg total) by mouth daily.  30 tablet  2  . diclofenac sodium (VOLTAREN) 1 % GEL Apply 2 g topically 4 (four) times daily.      Marland Kitchen donepezil (ARICEPT) 5 MG tablet Take 5 mg by mouth at bedtime.       . gabapentin (NEURONTIN) 100 MG capsule Take 100 mg by mouth daily.      Marland Kitchen ipratropium (ATROVENT) 0.02 % nebulizer solution Take 500 mcg by nebulization 4 (four) times daily as needed. Shortness of Breath      . labetalol (NORMODYNE) 100 MG tablet Take 100 mg by mouth 2 (two) times daily.       . methocarbamol (ROBAXIN) 500 MG tablet TAKE (1) TABLET BY MOUTH FOUR TIMES DAILY.  120 tablet  3  . oxyCODONE (OXY IR/ROXICODONE) 5 MG immediate release tablet Take 5 mg by mouth every 6 (six) hours as needed for moderate pain. pain      . pantoprazole (PROTONIX) 40 MG tablet Take 1 tablet (40 mg total) by mouth daily.  30 tablet  12  . risperiDONE (RISPERDAL) 0.5 MG tablet Take 2 tablets (1 mg total) by mouth at bedtime.  60 tablet  2   Scheduled: . ALPRAZolam  0.5 mg Oral BID   And  . ALPRAZolam  0.5 mg Oral Once per day on Mon Wed Fri  . amiodarone  200 mg Oral BID  . amitriptyline  25 mg Oral QHS  . amLODipine  10 mg Oral Daily  . atorvastatin  40 mg  Oral QPM  . cinacalcet  30 mg Oral Q breakfast  . citalopram  20 mg Oral Daily  . donepezil  5 mg Oral QHS  . enoxaparin (LOVENOX) injection  30 mg Subcutaneous Q24H  . [START ON 08/14/2013] epoetin (EPOGEN/PROCRIT) injection  10,000 Units Intravenous Q M,W,F-HD  . gabapentin  100 mg Oral Daily  . labetalol  100 mg Oral BID  . pantoprazole  40 mg Oral Daily  . risperiDONE  1 mg Oral QHS  . senna  1 tablet Oral BID  . sodium chloride  3 mL Intravenous Q12H   Continuous:  EYC:XKGYJE chloride, acetaminophen, acetaminophen, alum & mag hydroxide-simeth, bisacodyl, ipratropium, levalbuterol, methocarbamol, morphine injection, ondansetron (  ZOFRAN) IV, ondansetron, oxyCODONE, sodium chloride  Assesment: She came in with chest pain and persistent atrial tachycardia. She has ruled out for myocardial infarction. She has multiple other medical problems including COPD and end-stage renal disease on dialysis. She has started amiodarone. She is scheduled to have removal of her dialysis catheter because her fistula is being accessed now. Principal Problem:   Chest pain Active Problems:   Anxiety and depression   GERD   Anemia   Hypertension   Chronic kidney disease   Schizoaffective disorder   Chronic back pain greater than 3 months duration   COPD (chronic obstructive pulmonary disease)   ESRD on dialysis   Morbid obesity   Paroxysmal atrial tachycardia    Plan: Home after dialysis tomorrow. Continue with amiodarone    LOS: 1 day   Gini Caputo L 08/13/2013, 12:47 PM

## 2013-08-13 NOTE — Progress Notes (Signed)
Primary cardiologist: Dr. Jonelle SidleSamuel G. Yolinda Duerr  Subjective:   Sitting up in bed. Feels better today. No chest pain or palpitations.   Objective:   Temp:  [97.6 F (36.4 C)-98.4 F (36.9 C)] 98.3 F (36.8 C) (07/30 0621) Pulse Rate:  [64-137] 67 (07/30 0931) Resp:  [11-20] 20 (07/30 0621) BP: (109-167)/(48-92) 141/86 mmHg (07/30 0931) SpO2:  [95 %-100 %] 98 % (07/30 0621) Weight:  [220 lb (99.791 kg)-227 lb 1.2 oz (103 kg)] 227 lb 1.2 oz (103 kg) (07/30 0621) Last BM Date: 08/12/13  Filed Weights   08/12/13 1509 08/12/13 1655 08/13/13 [id number]  Weight: 223 lb 1.7 oz (101.2 kg) 220 lb (99.791 kg) 227 lb 1.2 oz (103 kg)   No intake or output data in the 24 hours ending 08/13/13 1102  Telemetry: Sinus rhythm.  Exam:  General: Obese, no distress.  Lungs: Clear, nonlabored.  Cardiac: RRR, no gallop.  Extremities: No pitting.  Lab Results:  Basic Metabolic Panel:  Recent Labs Lab 08/12/13 0952 08/13/13 0507  NA 135* 133*  K 4.0 4.2  CL 94* 95*  CO2 24 23  GLUCOSE 94 117*  BUN 15 23  CREATININE 6.10* 8.25*  CALCIUM 7.4* 6.4*    Liver Function Tests:  Recent Labs Lab 08/13/13 0507  AST 22  ALT 14  ALKPHOS 56  BILITOT 0.3  PROT 6.7  ALBUMIN 2.9*    CBC:  Recent Labs Lab 08/12/13 0952 08/13/13 0507  WBC 9.2 7.9  HGB 10.7* 9.9*  HCT 33.4* 31.1*  MCV 97.1 97.8  PLT 178 169    Cardiac Enzymes:  Recent Labs Lab 08/12/13 1713 08/12/13 2310 08/13/13 0507  TROPONINI <0.30 <0.30 <0.30    Echocardiogram 08/12/2013: Study Conclusions  - Left ventricle: The cavity size was normal. Wall thickness was increased in a pattern of mild LVH. Systolic function was normal. The estimated ejection fraction was in the range of 60% to 65%. Wall motion was normal; there were no regional wall motion abnormalities. Doppler parameters are consistent with abnormal left ventricular relaxation (grade 1 diastolic dysfunction). Doppler parameters are consistent  with elevated ventricular end-diastolic filling pressure. - Aortic valve: Trileaflet; mildly calcified leaflets. There was no significant regurgitation. - Mitral valve: Calcified annulus. There was trivial regurgitation. - Left atrium: The atrium was mildly dilated. - Right atrium: Central venous pressure (est): 3 mm Hg. - Tricuspid valve: There was physiologic regurgitation. - Pulmonary arteries: Systolic pressure could not be accurately estimated. - Pericardium, extracardiac: A prominent pericardial fat pad was present. A trivial pericardial effusion was identified.  Impressions:  - Mild LVH with LVEF 60-65%, grade 1 diastolic dysfunction with increased filling pressures. Mild left atrial enlargement. Mildly sclerotic aortic valve. Unable to assess PASP. Prominent pericardial fat pad was trivial effusion.   Medications:   Scheduled Medications: . ALPRAZolam  0.5 mg Oral BID   And  . ALPRAZolam  0.5 mg Oral Once per day on Mon Wed Fri  . amiodarone  200 mg Oral BID  . amitriptyline  25 mg Oral QHS  . amLODipine  10 mg Oral Daily  . atorvastatin  40 mg Oral QPM  . cinacalcet  30 mg Oral Q breakfast  . citalopram  20 mg Oral Daily  . donepezil  5 mg Oral QHS  . enoxaparin (LOVENOX) injection  30 mg Subcutaneous Q24H  . [START ON 08/14/2013] epoetin (EPOGEN/PROCRIT) injection  10,000 Units Intravenous Q M,W,F-HD  . gabapentin  100 mg Oral Daily  . labetalol  100 mg Oral BID  . pantoprazole  40 mg Oral Daily  . risperiDONE  1 mg Oral QHS  . senna  1 tablet Oral BID  . sodium chloride  3 mL Intravenous Q12H      PRN Medications:  sodium chloride, acetaminophen, acetaminophen, alum & mag hydroxide-simeth, bisacodyl, ipratropium, levalbuterol, methocarbamol, morphine injection, ondansetron (ZOFRAN) IV, ondansetron, oxyCODONE, sodium chloride   Assessment:   1. PSVT, looks to be an atrial tachycardia or possibly atypical atrial flutter, presently resolved and in sinus  rhythm. We have initiated oral amiodarone load for rhythm suppression. Followup echocardiogram shows LVEF 60-65%. No evidence of ACS by cardiac enzymes. Abnormal AST and ALT, normal TSH.  2. ESRD on hemodialysis.  3. Low risk Cardiolite study from March of this year without active ischemia.   Plan/Discussion:    Anticipate two-week amiodarone load at 200 mg oral twice daily, then convert to once daily. Otherwise continue regular antihypertensives. No plans to anticoagulate as yet, this can be discussed as an outpatient, for now start aspirin. If temporary hemodialysis catheter needs to be taken out now, no contraindication to proceed from a cardiac perspective. When discharged, she should be scheduled to followup with [place] in the next few weeks.   Jonelle Sidle, M.D., F.A.C.C.

## 2013-08-14 DIAGNOSIS — R079 Chest pain, unspecified: Principal | ICD-10-CM

## 2013-08-14 LAB — BASIC METABOLIC PANEL
ANION GAP: 18 — AB (ref 5–15)
BUN: 33 mg/dL — ABNORMAL HIGH (ref 6–23)
CO2: 22 mEq/L (ref 19–32)
Calcium: 5.9 mg/dL — CL (ref 8.4–10.5)
Chloride: 92 mEq/L — ABNORMAL LOW (ref 96–112)
Creatinine, Ser: 10.3 mg/dL — ABNORMAL HIGH (ref 0.50–1.10)
GFR calc Af Amer: 4 mL/min — ABNORMAL LOW (ref >90)
GFR calc non Af Amer: 3 mL/min — ABNORMAL LOW (ref >90)
GLUCOSE: 89 mg/dL (ref 70–99)
POTASSIUM: 5.3 meq/L (ref 3.7–5.3)
SODIUM: 132 meq/L — AB (ref 137–147)

## 2013-08-14 LAB — CBC
HCT: 29.9 % — ABNORMAL LOW (ref 36.0–46.0)
Hemoglobin: 9.4 g/dL — ABNORMAL LOW (ref 12.0–15.0)
MCH: 30.6 pg (ref 26.0–34.0)
MCHC: 31.4 g/dL (ref 30.0–36.0)
MCV: 97.4 fL (ref 78.0–100.0)
Platelets: 186 10*3/uL (ref 150–400)
RBC: 3.07 MIL/uL — AB (ref 3.87–5.11)
RDW: 15.4 % (ref 11.5–15.5)
WBC: 7.7 10*3/uL (ref 4.0–10.5)

## 2013-08-14 LAB — PHOSPHORUS: PHOSPHORUS: 4.4 mg/dL (ref 2.3–4.6)

## 2013-08-14 MED ORDER — SODIUM CHLORIDE 0.9 % IJ SOLN
INTRAMUSCULAR | Status: AC
  Filled 2013-08-14: qty 36

## 2013-08-14 MED ORDER — AMIODARONE HCL 200 MG PO TABS: 200.0000 mg | ORAL_TABLET | Freq: Two times a day (BID) | ORAL | Status: DC

## 2013-08-14 NOTE — Progress Notes (Signed)
Subjective: She feels well and has no complaints. Her breathing is okay. She had permacath removed.  Objective: Vital signs in last 24 hours: Temp:  [97.8 F (36.6 C)-98.1 F (36.7 C)] 97.9 F (36.6 C) (07/31 0800) Pulse Rate:  [63-71] 70 (07/31 0900) Resp:  [14-20] 16 (07/31 0800) BP: (113-141)/(50-86) 129/80 mmHg (07/31 0900) SpO2:  [95 %-100 %] 100 % (07/31 0800) Weight:  [103.1 kg (227 lb 4.7 oz)-104.7 kg (230 lb 13.2 oz)] 103.1 kg (227 lb 4.7 oz) (07/31 0800) Weight change: 3.548 kg (7 lb 13.2 oz) Last BM Date: 08/12/13  Intake/Output from previous day: 07/30 0701 - 07/31 0700 In: 840 [P.O.:840] Out: 2 [Stool:2]  PHYSICAL EXAM General appearance: alert, cooperative and no distress Resp: clear to auscultation bilaterally Cardio: regular rate and rhythm, S1, S2 normal, no murmur, click, rub or gallop GI: soft, non-tender; bowel sounds normal; no masses,  no organomegaly Extremities: extremities normal, atraumatic, no cyanosis or edema  Lab Results:  Results for orders placed during the hospital encounter of 08/12/13 (from the past 48 hour(s))  CBC     Status: Abnormal   Collection Time    08/12/13  9:52 AM      Result Value Ref Range   WBC 9.2  4.0 - 10.5 K/uL   RBC 3.44 (*) 3.87 - 5.11 MIL/uL   Hemoglobin 10.7 (*) 12.0 - 15.0 g/dL   HCT 33.4 (*) 36.0 - 46.0 %   MCV 97.1  78.0 - 100.0 fL   MCH 31.1  26.0 - 34.0 pg   MCHC 32.0  30.0 - 36.0 g/dL   RDW 15.8 (*) 11.5 - 15.5 %   Platelets 178  150 - 400 K/uL  BASIC METABOLIC PANEL     Status: Abnormal   Collection Time    08/12/13  9:52 AM      Result Value Ref Range   Sodium 135 (*) 137 - 147 mEq/L   Potassium 4.0  3.7 - 5.3 mEq/L   Chloride 94 (*) 96 - 112 mEq/L   CO2 24  19 - 32 mEq/L   Glucose, Bld 94  70 - 99 mg/dL   BUN 15  6 - 23 mg/dL   Creatinine, Ser 6.10 (*) 0.50 - 1.10 mg/dL   Calcium 7.4 (*) 8.4 - 10.5 mg/dL   GFR calc non Af Amer 6 (*) >90 mL/min   GFR calc Af Amer 7 (*) >90 mL/min   Comment:  (NOTE)     The eGFR has been calculated using the CKD EPI equation.     This calculation has not been validated in all clinical situations.     eGFR's persistently <90 mL/min signify possible Chronic Kidney     Disease.   Anion gap 17 (*) 5 - 15  TROPONIN I     Status: None   Collection Time    08/12/13  9:52 AM      Result Value Ref Range   Troponin I <0.30  <0.30 ng/mL   Comment:            Due to the release kinetics of cTnI,     a negative result within the first hours     of the onset of symptoms does not rule out     myocardial infarction with certainty.     If myocardial infarction is still suspected,     repeat the test at appropriate intervals.  PRO B NATRIURETIC PEPTIDE     Status: Abnormal   Collection  Time    08/12/13  9:52 AM      Result Value Ref Range   Pro B Natriuretic peptide (BNP) 7825.0 (*) 0 - 125 pg/mL  TSH     Status: None   Collection Time    08/12/13  9:52 AM      Result Value Ref Range   TSH 1.750  0.350 - 4.500 uIU/mL   Comment: Performed at [facility]  MRSA PCR SCREENING     Status: None   Collection Time    08/12/13  3:45 PM      Result Value Ref Range   MRSA by PCR NEGATIVE  NEGATIVE   Comment:            The GeneXpert MRSA Assay (FDA     approved for NASAL specimens     only), is one component of a     comprehensive MRSA colonization     surveillance program. It is not     intended to diagnose MRSA     infection nor to guide or     monitor treatment for     MRSA infections.  TROPONIN I     Status: None   Collection Time    08/12/13  5:13 PM      Result Value Ref Range   Troponin I <0.30  <0.30 ng/mL   Comment:            Due to the release kinetics of cTnI,     a negative result within the first hours     of the onset of symptoms does not rule out     myocardial infarction with certainty.     If myocardial infarction is still suspected,     repeat the test at appropriate intervals.  TSH     Status: None   Collection Time     08/12/13  8:11 PM      Result Value Ref Range   TSH 1.680  0.350 - 4.500 uIU/mL   Comment: Performed at [facility]  TROPONIN I     Status: None   Collection Time    08/12/13 11:10 PM      Result Value Ref Range   Troponin I <0.30  <0.30 ng/mL   Comment:            Due to the release kinetics of cTnI,     a negative result within the first hours     of the onset of symptoms does not rule out     myocardial infarction with certainty.     If myocardial infarction is still suspected,     repeat the test at appropriate intervals.  CBC     Status: Abnormal   Collection Time    08/13/13  5:07 AM      Result Value Ref Range   WBC 7.9  4.0 - 10.5 K/uL   RBC 3.18 (*) 3.87 - 5.11 MIL/uL   Hemoglobin 9.9 (*) 12.0 - 15.0 g/dL   HCT 73.4 (*) 19.3 - 79.0 %   MCV 97.8  78.0 - 100.0 fL   MCH 31.1  26.0 - 34.0 pg   MCHC 31.8  30.0 - 36.0 g/dL   RDW 24.0 (*) 97.3 - 53.2 %   Platelets 169  150 - 400 K/uL  COMPREHENSIVE METABOLIC PANEL     Status: Abnormal   Collection Time    08/13/13  5:07 AM      Result Value Ref Range  Sodium 133 (*) 137 - 147 mEq/L   Potassium 4.2  3.7 - 5.3 mEq/L   Chloride 95 (*) 96 - 112 mEq/L   CO2 23  19 - 32 mEq/L   Glucose, Bld 117 (*) 70 - 99 mg/dL   BUN 23  6 - 23 mg/dL   Creatinine, Ser 8.25 (*) 0.50 - 1.10 mg/dL   Calcium 6.4 (*) 8.4 - 10.5 mg/dL   Comment: CRITICAL RESULT CALLED TO, READ BACK BY AND VERIFIED WITH:     BULLOCK,T AT 6:50AM ON 08/13/13 BY FESTERMAN,C   Total Protein 6.7  6.0 - 8.3 g/dL   Albumin 2.9 (*) 3.5 - 5.2 g/dL   AST 22  0 - 37 U/L   ALT 14  0 - 35 U/L   Alkaline Phosphatase 56  39 - 117 U/L   Total Bilirubin 0.3  0.3 - 1.2 mg/dL   GFR calc non Af Amer 4 (*) >90 mL/min   GFR calc Af Amer 5 (*) >90 mL/min   Comment: (NOTE)     The eGFR has been calculated using the CKD EPI equation.     This calculation has not been validated in all clinical situations.     eGFR's persistently <90 mL/min signify possible Chronic  Kidney     Disease.   Anion gap 15  5 - 15  TROPONIN I     Status: None   Collection Time    08/13/13  5:07 AM      Result Value Ref Range   Troponin I <0.30  <0.30 ng/mL   Comment:            Due to the release kinetics of cTnI,     a negative result within the first hours     of the onset of symptoms does not rule out     myocardial infarction with certainty.     If myocardial infarction is still suspected,     repeat the test at appropriate intervals.  TROPONIN I     Status: None   Collection Time    08/13/13 10:41 AM      Result Value Ref Range   Troponin I <0.30  <0.30 ng/mL   Comment:            Due to the release kinetics of cTnI,     a negative result within the first hours     of the onset of symptoms does not rule out     myocardial infarction with certainty.     If myocardial infarction is still suspected,     repeat the test at appropriate intervals.  BASIC METABOLIC PANEL     Status: Abnormal   Collection Time    08/14/13  5:12 AM      Result Value Ref Range   Sodium 132 (*) 137 - 147 mEq/L   Potassium 5.3  3.7 - 5.3 mEq/L   Comment: DELTA CHECK NOTED   Chloride 92 (*) 96 - 112 mEq/L   CO2 22  19 - 32 mEq/L   Glucose, Bld 89  70 - 99 mg/dL   BUN 33 (*) 6 - 23 mg/dL   Creatinine, Ser 10.30 (*) 0.50 - 1.10 mg/dL   Calcium 5.9 (*) 8.4 - 10.5 mg/dL   Comment: CRITICAL RESULT CALLED TO, READ BACK BY AND VERIFIED WITH:     ROGERS,A AT 7:35AM ON 08/14/13 BY FESTERMAN,C   GFR calc non Af Amer 3 (*) >90 mL/min   GFR  calc Af Amer 4 (*) >90 mL/min   Comment: (NOTE)     The eGFR has been calculated using the CKD EPI equation.     This calculation has not been validated in all clinical situations.     eGFR's persistently <90 mL/min signify possible Chronic Kidney     Disease.   Anion gap 18 (*) 5 - 15  CBC     Status: Abnormal   Collection Time    08/14/13  5:12 AM      Result Value Ref Range   WBC 7.7  4.0 - 10.5 K/uL   RBC 3.07 (*) 3.87 - 5.11 MIL/uL    Hemoglobin 9.4 (*) 12.0 - 15.0 g/dL   HCT 29.9 (*) 36.0 - 46.0 %   MCV 97.4  78.0 - 100.0 fL   MCH 30.6  26.0 - 34.0 pg   MCHC 31.4  30.0 - 36.0 g/dL   RDW 15.4  11.5 - 15.5 %   Platelets 186  150 - 400 K/uL  PHOSPHORUS     Status: None   Collection Time    08/14/13  5:12 AM      Result Value Ref Range   Phosphorus 4.4  2.3 - 4.6 mg/dL    ABGS No results found for this basename: PHART, PCO2, PO2ART, TCO2, HCO3,  in the last 72 hours CULTURES Recent Results (from the past 240 hour(s))  MRSA PCR SCREENING     Status: None   Collection Time    08/12/13  3:45 PM      Result Value Ref Range Status   MRSA by PCR NEGATIVE  NEGATIVE Final   Comment:            The GeneXpert MRSA Assay (FDA     approved for NASAL specimens     only), is one component of a     comprehensive MRSA colonization     surveillance program. It is not     intended to diagnose MRSA     infection nor to guide or     monitor treatment for     MRSA infections.   Studies/Results: Dg Chest Portable 1 View  08/12/2013   CLINICAL DATA:  Chest pain.  EXAM: PORTABLE CHEST - 1 VIEW  COMPARISON:  03/30/2013.  FINDINGS: Dialysis catheter noted with tip projected over superior vena cava. Cardiomegaly with normal pulmonary vascularity. Bilateral pulmonary interstitial prominence noted. No pleural effusion or pneumothorax. No acute bony abnormality.  IMPRESSION: 1. Dialysis catheter with tip projected over superior vena cava. 2. Congestive heart failure from interstitial edema .   Electronically Signed   By: Marcello Moores  Register   On: 08/12/2013 10:23    Medications:  Prior to Admission:  Prescriptions prior to admission  Medication Sig Dispense Refill  . ALPRAZolam (XANAX) 0.5 MG tablet Take 0.5 mg by mouth See admin instructions. Take 1 tablet twice daily, on dialysis days take 1 tablet three times daily.      Marland Kitchen amitriptyline (ELAVIL) 25 MG tablet Take 1 tablet (25 mg total) by mouth at bedtime.  30 tablet  2  . amLODipine  (NORVASC) 10 MG tablet Take 1 tablet (10 mg total) by mouth daily.  30 tablet  12  . atorvastatin (LIPITOR) 40 MG tablet Take 1 tablet by mouth daily.      . B Complex-C-Folic Acid (RENAL SOFTGELS PO) Take 1 capsule by mouth daily.      . cinacalcet (SENSIPAR) 30 MG tablet Take 30 mg by mouth  daily.      . citalopram (CELEXA) 20 MG tablet Take 1 tablet (20 mg total) by mouth daily.  30 tablet  2  . diclofenac sodium (VOLTAREN) 1 % GEL Apply 2 g topically 4 (four) times daily.      Marland Kitchen donepezil (ARICEPT) 5 MG tablet Take 5 mg by mouth at bedtime.       . gabapentin (NEURONTIN) 100 MG capsule Take 100 mg by mouth daily.      Marland Kitchen ipratropium (ATROVENT) 0.02 % nebulizer solution Take 500 mcg by nebulization 4 (four) times daily as needed. Shortness of Breath      . labetalol (NORMODYNE) 100 MG tablet Take 100 mg by mouth 2 (two) times daily.       . methocarbamol (ROBAXIN) 500 MG tablet TAKE (1) TABLET BY MOUTH FOUR TIMES DAILY.  120 tablet  3  . oxyCODONE (OXY IR/ROXICODONE) 5 MG immediate release tablet Take 5 mg by mouth every 6 (six) hours as needed for moderate pain. pain      . pantoprazole (PROTONIX) 40 MG tablet Take 1 tablet (40 mg total) by mouth daily.  30 tablet  12  . risperiDONE (RISPERDAL) 0.5 MG tablet Take 2 tablets (1 mg total) by mouth at bedtime.  60 tablet  2   Scheduled: . ALPRAZolam  0.5 mg Oral BID   And  . ALPRAZolam  0.5 mg Oral Once per day on Mon Wed Fri  . amiodarone  200 mg Oral BID  . amitriptyline  25 mg Oral QHS  . amLODipine  10 mg Oral Daily  . atorvastatin  40 mg Oral QPM  . cinacalcet  30 mg Oral Q breakfast  . citalopram  20 mg Oral Daily  . donepezil  5 mg Oral QHS  . enoxaparin (LOVENOX) injection  30 mg Subcutaneous Q24H  . epoetin (EPOGEN/PROCRIT) injection  10,000 Units Intravenous Q M,W,F-HD  . gabapentin  100 mg Oral Daily  . labetalol  100 mg Oral BID  . pantoprazole  40 mg Oral Daily  . risperiDONE  1 mg Oral QHS  . senna  1 tablet Oral BID  .  sodium chloride  3 mL Intravenous Q12H   Continuous:  XTG:GYIRSW chloride, acetaminophen, acetaminophen, alum & mag hydroxide-simeth, bisacodyl, ipratropium, levalbuterol, methocarbamol, morphine injection, ondansetron (ZOFRAN) IV, ondansetron, oxyCODONE, sodium chloride  Assesment: She was admitted with chest pain and ruled out for myocardial infarction. She had paroxysmal atrial tachycardia and is being treated with amiodarone. She has chronic renal failure. She's to have dialysis today. Principal Problem:   Chest pain Active Problems:   Anxiety and depression   GERD   Anemia   Hypertension   Chronic kidney disease   Schizoaffective disorder   Chronic back pain greater than 3 months duration   COPD (chronic obstructive pulmonary disease)   ESRD on dialysis   Morbid obesity   Paroxysmal atrial tachycardia    Plan: Discharge after dialysis completed    LOS: 2 days   Sakai Wolford L 08/14/2013, 9:05 AM

## 2013-08-14 NOTE — Progress Notes (Signed)
Subjective: Interval History: has no complaint of chest pain. Patient denies any difficulty in breathing. Overall she's getting better..  Objective: Vital signs in last 24 hours: Temp:  [97.8 F (36.6 C)-98.1 F (36.7 C)] 97.8 F (36.6 C) (07/31 0700) Pulse Rate:  [67-71] 69 (07/31 0700) Resp:  [14-20] 15 (07/31 0700) BP: (113-141)/(50-86) 113/50 mmHg (07/31 0700) SpO2:  [95 %-100 %] 95 % (07/31 0700) Weight:  [104.7 kg (230 lb 13.2 oz)] 104.7 kg (230 lb 13.2 oz) (07/31 0700) Weight change: 3.548 kg (7 lb 13.2 oz)  Intake/Output from previous day: 07/30 0701 - 07/31 0700 In: 840 [P.O.:840] Out: 2 [Stool:2] Intake/Output this shift:    General appearance: alert, cooperative and no distress Resp: clear to auscultation bilaterally Cardio: regular rate and rhythm, S1, S2 normal, no murmur, click, rub or gallop GI: soft, non-tender; bowel sounds normal; no masses,  no organomegaly Extremities: extremities normal, atraumatic, no cyanosis or edema  Lab Results:  Recent Labs  08/13/13 0507 08/14/13 0512  WBC 7.9 7.7  HGB 9.9* 9.4*  HCT 31.1* 29.9*  PLT 169 186   BMET:  Recent Labs  08/12/13 0952 08/13/13 0507  NA 135* 133*  K 4.0 4.2  CL 94* 95*  CO2 24 23  GLUCOSE 94 117*  BUN 15 23  CREATININE 6.10* 8.25*  CALCIUM 7.4* 6.4*   No results found for this basename: PTH,  in the last 72 hours Iron Studies: No results found for this basename: IRON, TIBC, TRANSFERRIN, FERRITIN,  in the last 72 hours  Studies/Results: Dg Chest Portable 1 View  08/12/2013   CLINICAL DATA:  Chest pain.  EXAM: PORTABLE CHEST - 1 VIEW  COMPARISON:  03/30/2013.  FINDINGS: Dialysis catheter noted with tip projected over superior vena cava. Cardiomegaly with normal pulmonary vascularity. Bilateral pulmonary interstitial prominence noted. No pleural effusion or pneumothorax. No acute bony abnormality.  IMPRESSION: 1. Dialysis catheter with tip projected over superior vena cava. 2. Congestive  heart failure from interstitial edema .   Electronically Signed   By: Maisie Fushomas  Register   On: 08/12/2013 10:23    I have reviewed the patient's current medications.  Assessment/Plan: Problem #1 end-stage renal disease: She status post hemodialysis on Wednesday. Presently patient denies any uremic sign and symptoms. Problem #2 chest pain: Patient presently asymptomatic. At this moment etiology not clear possibly from GERD. Problem #3 anemia: Her hemoglobin is below range. Patient is on Epogen Problem #4 hypertension: Her blood pressure is reasonably controlled Problem #5 tachycardia: Her heart rate is controlled Problem #6 metabolic bone disease her calcium is in range.  Problem #7 schizoaffective disorder Plan: We'll make arrangements for patient to get dialysis today We'll continue his other treatments as before.   LOS: 2 days   [name] S 08/14/2013,7:33 AM

## 2013-08-14 NOTE — Consult Note (Signed)
Left internal jugular permacath removed without difficulty. Pressure held. No bleeding noted. Patient tolerated procedure well.

## 2013-08-14 NOTE — Plan of Care (Signed)
Dr. Informed of pt Calcium of 5.9.  No further orders at this time. Will fix at dialysis.

## 2013-08-14 NOTE — Discharge Planning (Signed)
Pt stated she was ready to be DC and pain was under control. Pt's IV and tele removed, told of future FU appointments.  Pt had dialysis and Perma cath was removed prior to DC.  Pt told of script at pharm.  Pt will be wheeled to car by PCT and family when ready.

## 2013-08-14 NOTE — Progress Notes (Signed)
Patient ID: [name], female   DOB: August 12, 1940, 73 y.o.   MRN: [id number]    Patient is off floor for dialysis. Telemetry and charting reviewed. Plan as described below.   1. 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 She has been started on oral amiodarone this admission. Plan would be for amio 200mg  bid x 3 weeks then 200mg  daily maintence therapy. No arrhythmias noted on telemetry review.   2. Chest pain - no evidence of ACS, recent low risk MPI earlier this year with no ischemia. Symptoms may have been related to her PSVT.  - continue risk factor modification.  - recommend daily ASA if not contraindicated    Will sign off of inpatient care. Please call with questions. She may follow up with NP Lyman BishopLawrence in 2-3 weeks.     Dina RichJonathan Emira Eubanks, M.D., F.A.C.C.

## 2013-08-15 NOTE — Discharge Summary (Signed)
Physician Discharge Summary  Patient ID: [name] MRN: [id number] DOB/AGE: 06/05/1940 73 y.o. Primary Care Physician:FAGAN,ROY, MD Admit date: 08/12/2013 Discharge date: 08/15/2013    Discharge Diagnoses:   Principal Problem:   Chest pain Active Problems:   Anxiety and depression   GERD   Anemia   Hypertension   Chronic kidney disease   Schizoaffective disorder   Chronic back pain greater than 3 months duration   COPD (chronic obstructive pulmonary disease)   ESRD on dialysis   Morbid obesity   Paroxysmal atrial tachycardia     Medication List         ALPRAZolam 0.5 MG tablet  Commonly known as:  XANAX  Take 0.5 mg by mouth See admin instructions. Take 1 tablet twice daily, on dialysis days take 1 tablet three times daily.     amiodarone 200 MG tablet  Commonly known as:  PACERONE  Take 1 tablet (200 mg total) by mouth 2 (two) times daily.     amitriptyline 25 MG tablet  Commonly known as:  ELAVIL  Take 1 tablet (25 mg total) by mouth at bedtime.     amLODipine 10 MG tablet  Commonly known as:  NORVASC  Take 1 tablet (10 mg total) by mouth daily.     atorvastatin 40 MG tablet  Commonly known as:  LIPITOR  Take 1 tablet by mouth daily.     cinacalcet 30 MG tablet  Commonly known as:  SENSIPAR  Take 30 mg by mouth daily.     citalopram 20 MG tablet  Commonly known as:  CELEXA  Take 1 tablet (20 mg total) by mouth daily.     diclofenac sodium 1 % Gel  Commonly known as:  VOLTAREN  Apply 2 g topically 4 (four) times daily.     donepezil 5 MG tablet  Commonly known as:  ARICEPT  Take 5 mg by mouth at bedtime.     gabapentin 100 MG capsule  Commonly known as:  NEURONTIN  Take 100 mg by mouth daily.     ipratropium 0.02 % nebulizer solution  Commonly known as:  ATROVENT  Take 500 mcg by nebulization 4 (four) times daily as needed. Shortness of Breath     labetalol 100 MG tablet  Commonly known as:  NORMODYNE  Take 100 mg by mouth 2 (two) times  daily.     methocarbamol 500 MG tablet  Commonly known as:  ROBAXIN  TAKE (1) TABLET BY MOUTH FOUR TIMES DAILY.     oxyCODONE 5 MG immediate release tablet  Commonly known as:  Oxy IR/ROXICODONE  Take 5 mg by mouth every 6 (six) hours as needed for moderate pain. pain     pantoprazole 40 MG tablet  Commonly known as:  PROTONIX  Take 1 tablet (40 mg total) by mouth daily.     RENAL SOFTGELS PO  Take 1 capsule by mouth daily.     risperiDONE 0.5 MG tablet  Commonly known as:  RISPERDAL  Take 2 tablets (1 mg total) by mouth at bedtime.        Discharged Condition: Improved    Consults: Nephrology/cardiology  Significant Diagnostic Studies: Dg Chest Portable 1 View  08/12/2013   CLINICAL DATA:  Chest pain.  EXAM: PORTABLE CHEST - 1 VIEW  COMPARISON:  03/30/2013.  FINDINGS: Dialysis catheter noted with tip projected over superior vena cava. Cardiomegaly with normal pulmonary vascularity. Bilateral pulmonary interstitial prominence noted. No pleural effusion or pneumothorax. No acute bony abnormality.  IMPRESSION: 1. Dialysis catheter with tip projected over superior vena cava. 2. Congestive heart failure from interstitial edema .   Electronically Signed   By: Maisie Fushomas  Register   On: 08/12/2013 10:23    Lab Results: Basic Metabolic Panel:  Recent Labs  16/10/9605/30/15 0507 08/14/13 0512  NA 133* 132*  K 4.2 5.3  CL 95* 92*  CO2 23 22  GLUCOSE 117* 89  BUN 23 33*  CREATININE 8.25* 10.30*  CALCIUM 6.4* 5.9*  PHOS  --  4.4   Liver Function Tests:  Recent Labs  08/13/13 0507  AST 22  ALT 14  ALKPHOS 56  BILITOT 0.3  PROT 6.7  ALBUMIN 2.9*     CBC:  Recent Labs  08/13/13 0507 08/14/13 0512  WBC 7.9 7.7  HGB 9.9* 9.4*  HCT 31.1* 29.9*  MCV 97.8 97.4  PLT 169 186    Recent Results (from the past 240 hour(s))  MRSA PCR SCREENING     Status: None   Collection Time    08/12/13  3:45 PM      Result Value Ref Range Status   MRSA by PCR NEGATIVE  NEGATIVE  Final   Comment:            The GeneXpert MRSA Assay (FDA     approved for NASAL specimens     only), is one component of a     comprehensive MRSA colonization     surveillance program. It is not     intended to diagnose MRSA     infection nor to guide or     monitor treatment for     MRSA infections.     Hospital Course: This is a 73 year old who developed chest discomfort and tachycardia during dialysis. She was brought to the emergency department where she was noted to have SVT. She was treated and improved. She ruled out for myocardial infarction. She had a permacath in place but is now using her fistula so her permacath was removed. She had dialysis once he was hospitalized. Cardiology consultation was obtained it was felt that she should be started on amiodarone. This was started. She was discharged home after dialysis in much improved condition.  Discharge Exam: Blood pressure 162/72, pulse 67, temperature 97.9 F (36.6 C), temperature source Oral, resp. rate 16, height 4\' 11"  (1.499 m), weight 101.3 kg (223 lb 5.2 oz), SpO2 99.00%. She is awake and alert. Her chest is clear. Her heart is regular. Abdomen is soft  Disposition: Home to continue outpatient dialysis      Discharge Instructions   Discharge patient    Complete by:  As directed   After dialysis             Signed: Pascale Maves L   08/15/2013, 8:39 AM

## 2013-09-04 ENCOUNTER — Other Ambulatory Visit (HOSPITAL_COMMUNITY): Payer: Self-pay | Admitting: Psychiatry

## 2013-09-11 ENCOUNTER — Other Ambulatory Visit (HOSPITAL_COMMUNITY): Payer: Self-pay | Admitting: *Deleted

## 2013-09-11 MED ORDER — CITALOPRAM HYDROBROMIDE 20 MG PO TABS: 20.0000 mg | ORAL_TABLET | Freq: Every day | ORAL | Status: DC

## 2013-09-11 NOTE — Telephone Encounter (Signed)
Per Dr. Tenny Craw to fill pt med for 30 days worth.. Fax Received. Refill Completed. Shunna Mikaelian Chowoe (R.M.A)

## 2013-09-29 ENCOUNTER — Encounter (HOSPITAL_COMMUNITY): Payer: Self-pay | Admitting: Psychiatry

## 2013-09-29 ENCOUNTER — Ambulatory Visit (INDEPENDENT_AMBULATORY_CARE_PROVIDER_SITE_OTHER): Payer: Medicare Other | Admitting: Psychiatry

## 2013-09-29 VITALS — BP 116/54 | HR 60 | Ht 59.0 in | Wt 218.8 lb

## 2013-09-29 DIAGNOSIS — F250 Schizoaffective disorder, bipolar type: Secondary | ICD-10-CM

## 2013-09-29 DIAGNOSIS — F419 Anxiety disorder, unspecified: Principal | ICD-10-CM

## 2013-09-29 DIAGNOSIS — F329 Major depressive disorder, single episode, unspecified: Secondary | ICD-10-CM

## 2013-09-29 DIAGNOSIS — F341 Dysthymic disorder: Secondary | ICD-10-CM

## 2013-09-29 DIAGNOSIS — F259 Schizoaffective disorder, unspecified: Secondary | ICD-10-CM

## 2013-09-29 MED ORDER — AMITRIPTYLINE HCL 25 MG PO TABS: 25.0000 mg | ORAL_TABLET | Freq: Every day | ORAL | Status: DC

## 2013-09-29 MED ORDER — RISPERIDONE 2 MG PO TABS: 2.0000 mg | ORAL_TABLET | Freq: Every day | ORAL | Status: DC

## 2013-09-29 MED ORDER — FLUOXETINE HCL 20 MG PO CAPS: 20.0000 mg | ORAL_CAPSULE | Freq: Every day | ORAL | Status: DC

## 2013-09-29 MED ORDER — ALPRAZOLAM 0.5 MG PO TABS
0.5000 mg | ORAL_TABLET | Freq: Three times a day (TID) | ORAL | Status: DC
Start: 2013-09-29 — End: 2013-12-29
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
## 2013-10-03 ENCOUNTER — Other Ambulatory Visit (HOSPITAL_COMMUNITY): Payer: Self-pay | Admitting: Psychiatry

## 2013-10-05 ENCOUNTER — Telehealth (HOSPITAL_COMMUNITY): Payer: Self-pay | Admitting: *Deleted

## 2013-10-05 NOTE — Telephone Encounter (Signed)
Pt pharmacy requesting pt Celexa. Per notes on file, pt was D/C from this medication and placed on Prozac. Called pharmacy and spoke to K[place]etoh and he will D/C Celexa from their records.

## 2013-10-08 ENCOUNTER — Ambulatory Visit (HOSPITAL_COMMUNITY): Payer: Self-pay | Admitting: Psychiatry

## 2013-10-13 ENCOUNTER — Other Ambulatory Visit (HOSPITAL_COMMUNITY): Payer: Self-pay | Admitting: Oncology

## 2013-10-22 ENCOUNTER — Telehealth (HOSPITAL_COMMUNITY): Payer: Self-pay | Admitting: *Deleted

## 2013-10-22 ENCOUNTER — Ambulatory Visit (INDEPENDENT_AMBULATORY_CARE_PROVIDER_SITE_OTHER): Payer: Medicare Other | Admitting: Cardiology

## 2013-10-22 ENCOUNTER — Encounter: Payer: Self-pay | Admitting: Cardiology

## 2013-10-22 VITALS — BP 140/82 | HR 80 | Ht 59.0 in | Wt 218.0 lb

## 2013-10-22 DIAGNOSIS — Z992 Dependence on renal dialysis: Secondary | ICD-10-CM

## 2013-10-22 DIAGNOSIS — Z79899 Other long term (current) drug therapy: Secondary | ICD-10-CM

## 2013-10-22 DIAGNOSIS — I471 Supraventricular tachycardia: Secondary | ICD-10-CM | POA: Insufficient documentation

## 2013-10-22 DIAGNOSIS — N186 End stage renal disease: Secondary | ICD-10-CM

## 2013-10-22 MED ORDER — AMIODARONE HCL 200 MG PO TABS: 200.0000 mg | ORAL_TABLET | Freq: Every day | ORAL | Status: DC

## 2013-10-22 NOTE — Progress Notes (Signed)
Clinical Summary Ms. Strayer is a 73 y.o.female seen in consultation at [facility] back in March, and more recently in July. She has had intermittent chest pain symptoms with reassuring workup back in March including Lexiscan Myoview that was low risk without evidence of scar or ischemia, LVEF 67%. In July she was noted to have an episode of symptomatic atrial tachycardia versus atypical atrial flutter that converted to sinus rhythm with Cardizem. She was initiated on amiodarone at that time for rhythm suppression, not otherwise anticoagulated however.  Echocardiogram from July reported mild LVH with LVEF 60-65%, grade 1 diastolic dysfunction with increased filling pressures, mild left atrial enlargement, mildly sclerotic aortic valve, unable to assess PASP, prominent epicardial fat pad with trivial pericardial effusion.  She is here with her husband and daughter today. She be doing well without any significant palpitations. They do tell me that she has had problems with low blood pressures during hemodialysis. We reviewed her medications.   Allergies  Allergen Reactions  . Neurontin [Gabapentin] Other (See Comments)    Had about every known side effect with 300mg  three a day on this. DO NOT TRY THIS AGAIN!  . Ativan [Lorazepam] Nausea Only and Other (See Comments)    Disoriented, not feel like herself   . Ferrous Gluconate Nausea Only    hypotension    Current Outpatient Prescriptions  Medication Sig Dispense Refill  . ALPRAZolam (XANAX) 0.5 MG tablet Take 1 tablet (0.5 mg total) by mouth 3 (three) times daily.  90 tablet  2  . amitriptyline (ELAVIL) 25 MG tablet Take 1 tablet (25 mg total) by mouth at bedtime.  30 tablet  2  . amLODipine (NORVASC) 10 MG tablet Take 1 tablet (10 mg total) by mouth daily.  30 tablet  12  . atorvastatin (LIPITOR) 40 MG tablet Take 1 tablet by mouth daily.      . diclofenac sodium (VOLTAREN) 1 % GEL Apply 2 g topically 4 (four) times daily.      Marland Kitchen  donepezil (ARICEPT) 5 MG tablet Take 5 mg by mouth at bedtime.       Marland Kitchen FLUoxetine (PROZAC) 20 MG capsule Take 1 capsule (20 mg total) by mouth daily.  30 capsule  2  . gabapentin (NEURONTIN) 100 MG capsule Take 100 mg by mouth daily.      Marland Kitchen ipratropium (ATROVENT) 0.02 % nebulizer solution Take 500 mcg by nebulization 4 (four) times daily as needed. Shortness of Breath      . labetalol (NORMODYNE) 100 MG tablet Take 100 mg by mouth 2 (two) times daily.       . methocarbamol (ROBAXIN) 500 MG tablet TAKE (1) TABLET BY MOUTH FOUR TIMES DAILY.  120 tablet  3  . omeprazole (PRILOSEC) 20 MG capsule Take 20 mg by mouth daily.      Marland Kitchen oxyCODONE (OXY IR/ROXICODONE) 5 MG immediate release tablet Take 5 mg by mouth every 6 (six) hours as needed for moderate pain. pain      . risperiDONE (RISPERDAL) 2 MG tablet Take 1 tablet (2 mg total) by mouth at bedtime.  30 tablet  2  . amiodarone (PACERONE) 200 MG tablet Take 1 tablet (200 mg total) by mouth daily.  90 tablet  3   No current facility-administered medications for this visit.    Past Medical History  Diagnosis Date  . Essential hypertension, benign   . Hypercholesterolemia   . GERD (gastroesophageal reflux disease)   . Asthma   . Osteoporosis   .  ESRD (end stage renal disease) on dialysis   . Anemia of chronic disease   . Peripheral neuropathy   . Schizoaffective disorder   . Arthritis   . History of pneumonia     Social History Ms. Freeze reports that she has quit smoking. Her smoking use included Cigarettes. She smoked 0.00 packs per day. She quit smokeless tobacco use about 12 years ago. Ms. [name] reports that she does not drink alcohol.  Review of Systems No chest pain or recurrent palpitations. Stable appetite. She is going through physical therapy to try to build up her stamina. Other systems reviewed and negative.  Physical Examination Filed Vitals:   10/22/13 1423  BP: 140/82  Pulse: 80   Filed Weights   10/22/13 1423    Weight: 218 lb (98.884 kg)   Obese woman, appears comfortable at rest. HEENT: Conjunctiva and lids normal, oropharynx clear. Neck: Supple, increased girth, no carotid bruits, no thyromegaly. Lungs: Clear to auscultation, nonlabored breathing at rest. Cardiac: Regular rate and rhythm, no S3 or significant systolic murmur, no pericardial rub. Extremities: No pitting edema, distal pulses 2+.   Problem List and Plan   Atrial tachycardia Symptomatically well controlled on amiodarone. Reduce amiodarone to 200 mg once daily, will otherwise stay on labetalol for now. Followup planned in 3 months with TSH and LFTs.  ESRD on dialysis Keep followup with Dr. Kristian CoveyBefekadu. I recommended that they discuss her sessions regarding intermittent hypotension.    Jonelle SidleSamuel G. Yaremi Stahlman, M.D., F.A.C.C.

## 2013-10-22 NOTE — Assessment & Plan Note (Signed)
Symptomatically well controlled on amiodarone. Reduce amiodarone to 200 mg once daily, will otherwise stay on labetalol for now. Followup planned in 3 months with TSH and LFTs.

## 2013-10-22 NOTE — Patient Instructions (Signed)
Your physician recommends that you schedule a follow-up appointment in: 3 months  Please get lab work just before next visit in 3 months (TSH,LFT's)     Your physician has recommended you make the following change in your medication:    DECREASE Amiodarone to 200 mg daily        Thank you for choosing [facility] !

## 2013-10-22 NOTE — Assessment & Plan Note (Signed)
Keep followup with Dr. Kristian CoveyBefekadu. I recommended that they discuss her sessions regarding intermittent hypotension.

## 2013-10-26 ENCOUNTER — Telehealth (HOSPITAL_COMMUNITY): Payer: Self-pay | Admitting: *Deleted

## 2013-10-27 ENCOUNTER — Ambulatory Visit (HOSPITAL_COMMUNITY): Payer: Self-pay | Admitting: Psychiatry

## 2013-10-27 IMAGING — CR DG ABDOMEN ACUTE W/ 1V CHEST
3 series · 3 of 3 positions shown · non-contrast
Comparison: Scout image for CT scan dated 05/22/2005

CLINICAL DATA: Abdominal distention.  Cough and wheezing.

ACUTE ABDOMEN SERIES (ABDOMEN 2 VIEW & CHEST 1 VIEW)

[view not recorded (1 of 3)]
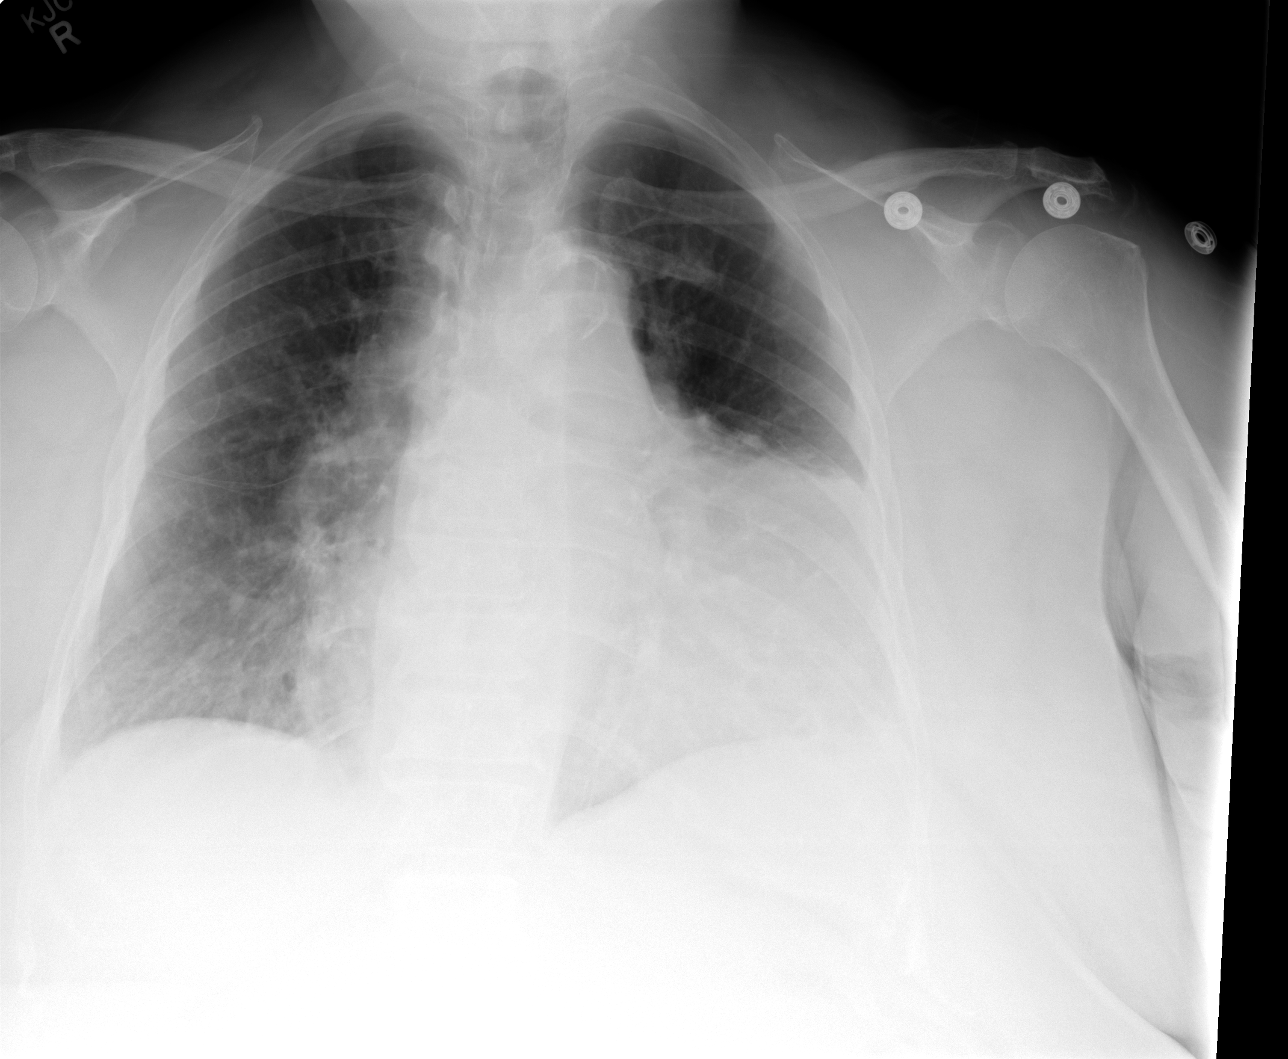

[view not recorded (2 of 3)]
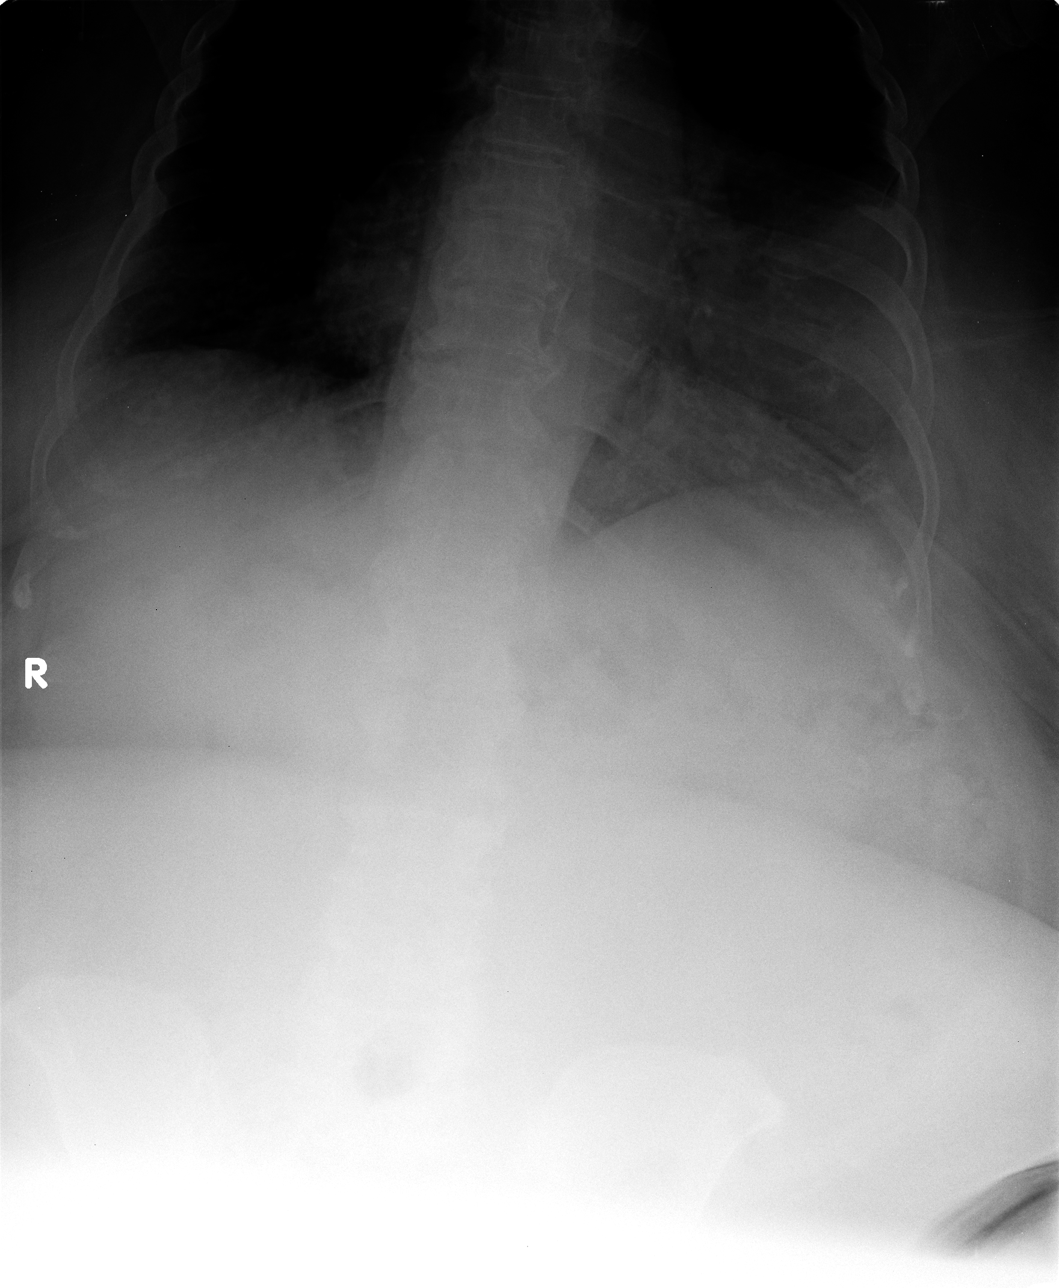

[view not recorded (3 of 3)]
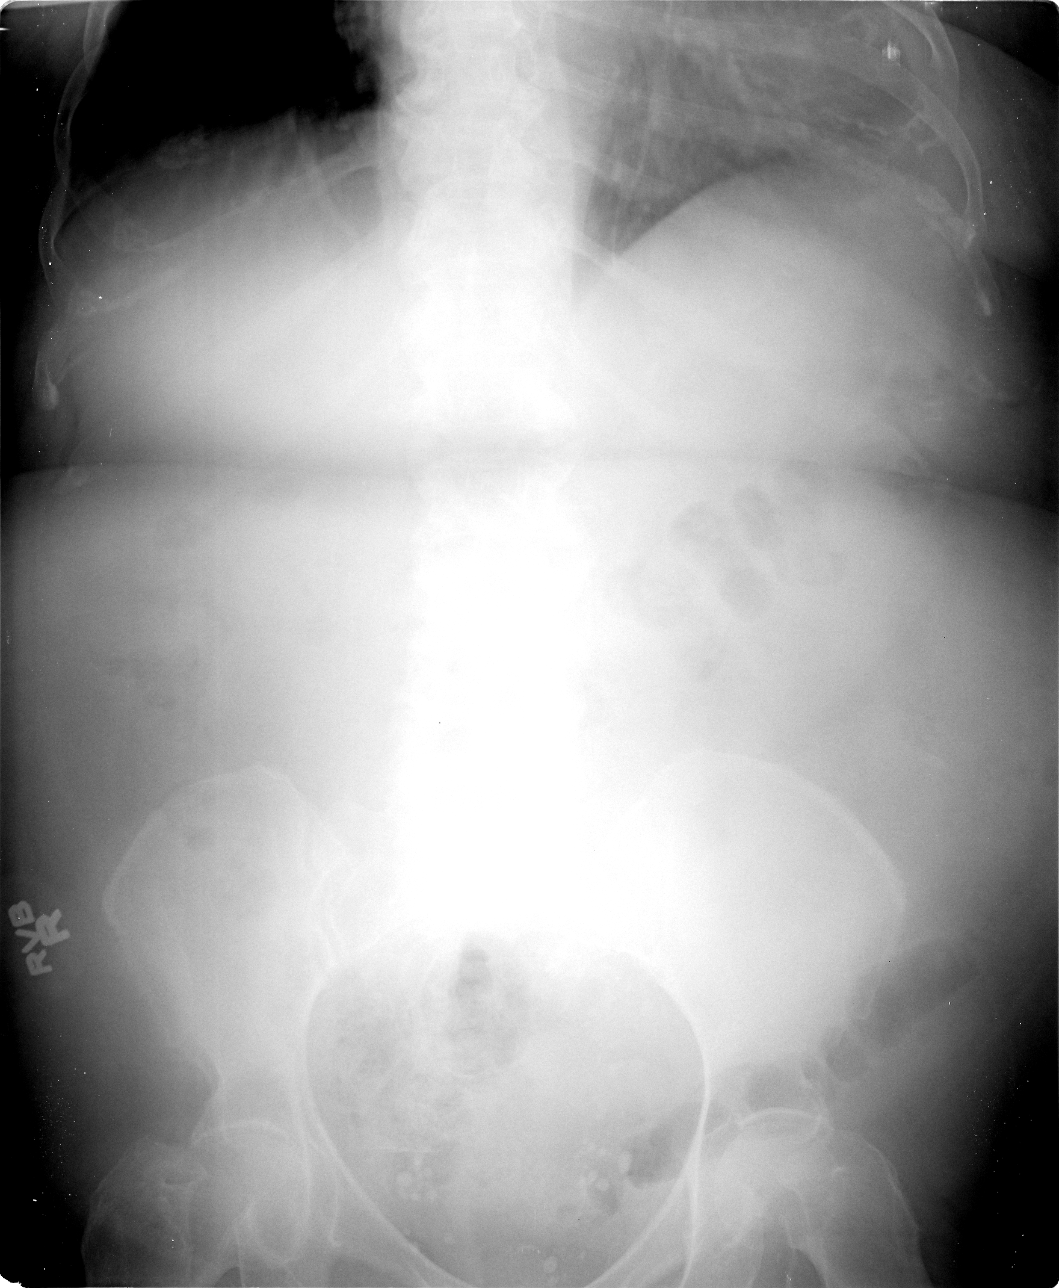

[3 of 3 positions shown; findings below may reference images not displayed]

FINDINGS: The patient has marked cardiomegaly which is new since
the prior CT scan dated 05/22/2005.  Pulmonary vascular prominence
without effusions or infiltrates.  The carina is slightly splayed
which could be due to enlargement of the left atrium but adenopathy
can give this appearance as well.

No free air under the diaphragm.  Bowel gas pattern is normal.
Overall density of the abdomen is increased suggesting ascites.
IMPRESSION: 1.  Probable ascites.
2.  New cardiomegaly with either enlargement of the left atrium or
subcarinal adenopathy.

## 2013-11-03 ENCOUNTER — Other Ambulatory Visit (HOSPITAL_COMMUNITY): Payer: Self-pay | Admitting: Psychiatry

## 2013-11-03 IMAGING — CR DG CHEST 1V PORT
1 series · 1 of 1 positions shown · non-contrast
Comparison: None available.

CLINICAL DATA: Preoperative exam for diatek placement.  End-stage
renal disease.

PORTABLE CHEST - 1 VIEW

[view not recorded]
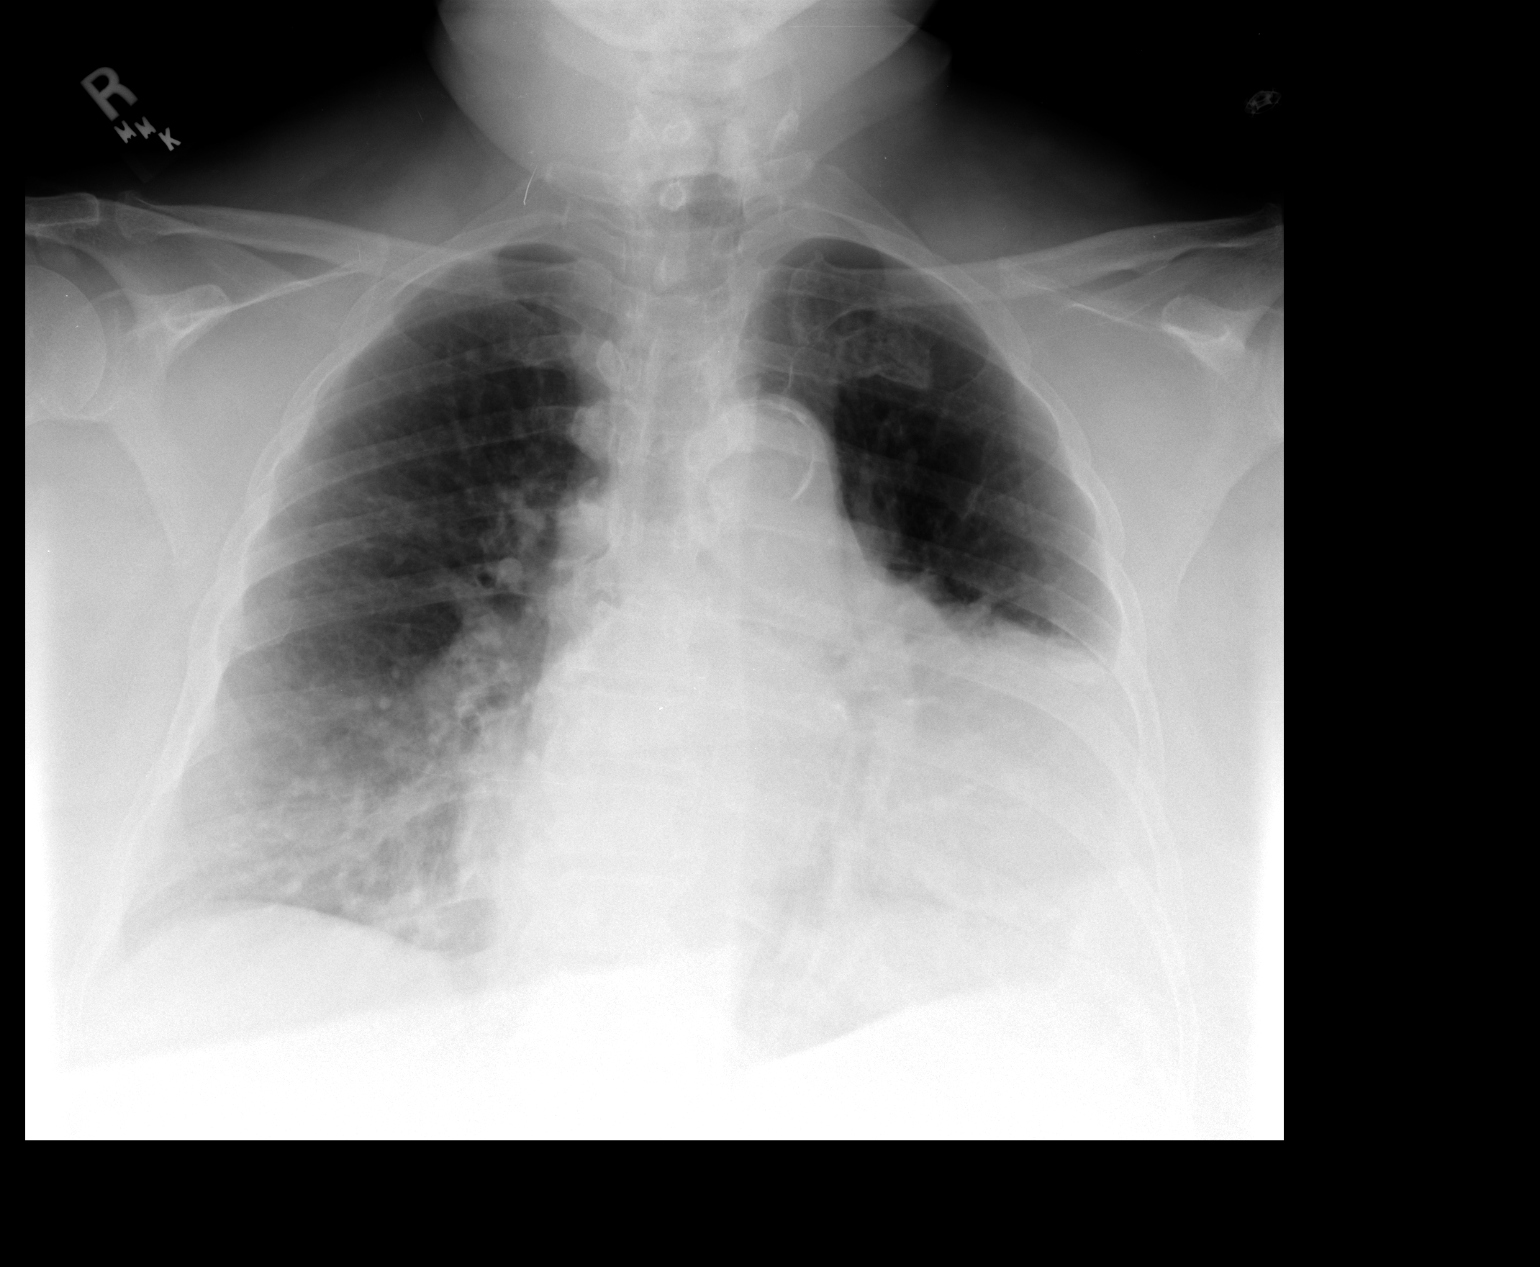

[1 of 1 positions shown; findings below may reference images not displayed]

FINDINGS: Cardiomegaly.

Consolidation left base may represent cardiomegaly with
atelectasis.  Infiltrate, pleural effusion or mass not excluded.
This can be evaluated on follow-up two-view exam with better
inspiration.

Central pulmonary vascular prominence without frank pulmonary
edema.

No gross pneumothorax.

Calcified mildly tortuous aorta.
IMPRESSION: Cardiomegaly.

Consolidation left base may represent cardiomegaly with
atelectasis.  Infiltrate, pleural effusion or mass not excluded.
This can be evaluated on follow-up two-view exam with better
inspiration.

Central pulmonary vascular prominence without frank pulmonary
edema.

Calcified mildly tortuous aorta.

Results to OR via [REDACTED] technologist.

## 2013-12-04 ENCOUNTER — Other Ambulatory Visit (HOSPITAL_COMMUNITY): Payer: Self-pay | Admitting: Psychiatry

## 2013-12-07 ENCOUNTER — Telehealth (HOSPITAL_COMMUNITY): Payer: Self-pay | Admitting: *Deleted

## 2013-12-07 ENCOUNTER — Emergency Department (HOSPITAL_COMMUNITY)
Admission: EM | Admit: 2013-12-07 | Discharge: 2013-12-07 | Disposition: A | Discharge status: Home / Self Care | Payer: Medicare Other | Attending: Emergency Medicine | Admitting: Emergency Medicine

## 2013-12-07 ENCOUNTER — Emergency Department (HOSPITAL_COMMUNITY): Payer: Medicare Other

## 2013-12-07 ENCOUNTER — Encounter (HOSPITAL_COMMUNITY): Payer: Self-pay | Admitting: Emergency Medicine

## 2013-12-07 DIAGNOSIS — F259 Schizoaffective disorder, unspecified: Secondary | ICD-10-CM | POA: Insufficient documentation

## 2013-12-07 DIAGNOSIS — Z862 Personal history of diseases of the blood and blood-forming organs and certain disorders involving the immune mechanism: Secondary | ICD-10-CM | POA: Diagnosis not present

## 2013-12-07 DIAGNOSIS — R531 Weakness: Secondary | ICD-10-CM | POA: Diagnosis not present

## 2013-12-07 DIAGNOSIS — Z8701 Personal history of pneumonia (recurrent): Secondary | ICD-10-CM | POA: Insufficient documentation

## 2013-12-07 DIAGNOSIS — Z79899 Other long term (current) drug therapy: Secondary | ICD-10-CM | POA: Diagnosis not present

## 2013-12-07 DIAGNOSIS — Z8739 Personal history of other diseases of the musculoskeletal system and connective tissue: Secondary | ICD-10-CM | POA: Diagnosis not present

## 2013-12-07 DIAGNOSIS — J45901 Unspecified asthma with (acute) exacerbation: Secondary | ICD-10-CM | POA: Diagnosis not present

## 2013-12-07 DIAGNOSIS — I120 Hypertensive chronic kidney disease with stage 5 chronic kidney disease or end stage renal disease: Secondary | ICD-10-CM | POA: Insufficient documentation

## 2013-12-07 DIAGNOSIS — E780 Pure hypercholesterolemia: Secondary | ICD-10-CM | POA: Insufficient documentation

## 2013-12-07 DIAGNOSIS — N186 End stage renal disease: Secondary | ICD-10-CM

## 2013-12-07 DIAGNOSIS — E669 Obesity, unspecified: Secondary | ICD-10-CM | POA: Diagnosis not present

## 2013-12-07 DIAGNOSIS — R0600 Dyspnea, unspecified: Secondary | ICD-10-CM

## 2013-12-07 DIAGNOSIS — K219 Gastro-esophageal reflux disease without esophagitis: Secondary | ICD-10-CM | POA: Insufficient documentation

## 2013-12-07 DIAGNOSIS — Z87891 Personal history of nicotine dependence: Secondary | ICD-10-CM | POA: Diagnosis not present

## 2013-12-07 DIAGNOSIS — G629 Polyneuropathy, unspecified: Secondary | ICD-10-CM | POA: Diagnosis not present

## 2013-12-07 DIAGNOSIS — R0602 Shortness of breath: Secondary | ICD-10-CM | POA: Diagnosis present

## 2013-12-07 LAB — CBC WITH DIFFERENTIAL/PLATELET
BASOS PCT: 0 % (ref 0–1)
Basophils Absolute: 0 10*3/uL (ref 0.0–0.1)
Eosinophils Absolute: 0.6 10*3/uL (ref 0.0–0.7)
Eosinophils Relative: 9 % — ABNORMAL HIGH (ref 0–5)
HCT: 33.9 % — ABNORMAL LOW (ref 36.0–46.0)
HEMOGLOBIN: 10.7 g/dL — AB (ref 12.0–15.0)
LYMPHS ABS: 1 10*3/uL (ref 0.7–4.0)
Lymphocytes Relative: 14 % (ref 12–46)
MCH: 29.2 pg (ref 26.0–34.0)
MCHC: 31.6 g/dL (ref 30.0–36.0)
MCV: 92.6 fL (ref 78.0–100.0)
MONOS PCT: 5 % (ref 3–12)
Monocytes Absolute: 0.4 10*3/uL (ref 0.1–1.0)
NEUTROS ABS: 5.1 10*3/uL (ref 1.7–7.7)
Neutrophils Relative %: 72 % (ref 43–77)
Platelets: 218 10*3/uL (ref 150–400)
RBC: 3.66 MIL/uL — AB (ref 3.87–5.11)
RDW: 14.8 % (ref 11.5–15.5)
WBC: 7.1 10*3/uL (ref 4.0–10.5)

## 2013-12-07 LAB — BASIC METABOLIC PANEL
ANION GAP: 18 — AB (ref 5–15)
BUN: 37 mg/dL — ABNORMAL HIGH (ref 6–23)
CHLORIDE: 91 meq/L — AB (ref 96–112)
CO2: 24 mEq/L (ref 19–32)
Calcium: 9.3 mg/dL (ref 8.4–10.5)
Creatinine, Ser: 12.11 mg/dL — ABNORMAL HIGH (ref 0.50–1.10)
GFR calc Af Amer: 3 mL/min — ABNORMAL LOW (ref >90)
GFR, EST NON AFRICAN AMERICAN: 3 mL/min — AB (ref >90)
Glucose, Bld: 92 mg/dL (ref 70–99)
Potassium: 4.6 mEq/L (ref 3.7–5.3)
Sodium: 133 mEq/L — ABNORMAL LOW (ref 137–147)

## 2013-12-07 LAB — TROPONIN I: Troponin I: <0.3 ng/mL (ref <0.30)

## 2013-12-07 NOTE — Telephone Encounter (Signed)
Pt pharmacy sent paper refill request for more refill on pt Risperidone 2mg  QHS. Pt medication was last filled 09-29-13 with 30 tablets 2 refills. Pt was scheduled to come into office for f/u appt but provider was out of office. message was left for pt to call office to reschedule appt but pt never did call back for a follow up appt. Called pt and lmtcb with husband to have pt call office back due to pt needing f/u appt. Per pt husband, he will give pt the message.

## 2013-12-07 NOTE — ED Notes (Signed)
Spoke with Dialysis, they stated they will be able to dialysis patient if she comes right away.

## 2013-12-07 NOTE — ED Notes (Signed)
Per EMS, call was for shortness of breath. States when they arrived, patient had given herself a breathing treatment and was only complaining of weakness and stiffness of lower extremities. Patient states "I'm a little short of breath and I couldn't walk down the stairs this morning." EMS states patient's O2 was 89-90% upon arrival. Placed patient on 4L, and O2 increased to 99%.

## 2013-12-07 NOTE — Discharge Instructions (Signed)
Tests showed no obvious abnormalities. Go to dialysis today.

## 2013-12-07 NOTE — ED Provider Notes (Signed)
CSN: [id number]     Arrival date & time 12/07/13  0702 History  This chart was scribed for Nat Christen, MD by Edison Simon, ED Scribe. This patient was seen in room [facility] and the patient's care was started at 7:20 AM.    Chief Complaint  Patient presents with  . Shortness of Breath  . Weakness   The history is provided by the patient. No language interpreter was used.    HPI Comments: AMYLAH WILL is a 73 y.o. female who presents to the Emergency Department complaining of generalized weakness with onset yesterday and recurring this morning. She states she was doing exercises in bed and was not able to sit up afterwards. She believes her weakness is not due to her exercise. She states she is having therapy to relearn how to walk because of extensive arthritis. She states she used a breathing treatment this morning because she was having wheezing with improvement; she states she normally uses breathing treatments 4 times a day. She denies chest pain, shortness of breath, or neurological deficits.  PCP: Asencion Noble, MD   Past Medical History  Diagnosis Date  . Essential hypertension, benign   . Hypercholesterolemia   . GERD (gastroesophageal reflux disease)   . Asthma   . Osteoporosis   . ESRD (end stage renal disease) on dialysis   . Anemia of chronic disease   . Peripheral neuropathy   . Schizoaffective disorder   . Arthritis   . History of pneumonia    Past Surgical History  Procedure Laterality Date  . Tubal ligation    . Breast biopsy    . Refractive surgery      Right eye  . Bone marrow aspiration    . Bone marrow biopsy    . Insertion of dialysis catheter  01/23/2011    Procedure: INSERTION OF DIALYSIS CATHETER;  Surgeon: Angelia Mould, MD;  Location: [facility];  Service: Vascular;  Laterality: N/A;  Insertion of Dialysis Catheter- Right Internal Jugular  . Yag laser application  78/24/2353    Procedure: YAG LASER APPLICATION;  Surgeon: Elta Guadeloupe T. Gershon Crane, MD;   Location: [facility];  Service: Ophthalmology;  Laterality: Left;   Family History  Problem Relation Age of Onset  . Alcohol abuse Brother   . Bipolar disorder Brother   . Dementia Mother   . Depression Mother   . ADD / ADHD Neg Hx   . Anxiety disorder Neg Hx   . Drug abuse Neg Hx   . OCD Neg Hx   . Paranoid behavior Neg Hx   . Schizophrenia Neg Hx   . Seizures Neg Hx   . Physical abuse Neg Hx   . Sexual abuse Neg Hx   . Healthy Daughter   . Healthy Daughter   . Healthy Daughter    History  Substance Use Topics  . Smoking status: Former Smoker    Types: Cigarettes  . Smokeless tobacco: Former Systems developer    Quit date: 10/22/2001  . Alcohol Use: No   OB History    No data available     Review of Systems   A complete 10 system review of systems was obtained and all systems are negative except as noted in the HPI and PMH.    Allergies  Neurontin; Ativan; and Ferrous gluconate  Home Medications   Prior to Admission medications   Medication Sig Start Date End Date Taking? Authorizing Provider  ALPRAZolam Duanne Moron) 0.5 MG tablet Take 1 tablet (0.5 mg total)  by mouth 3 (three) times daily. 09/29/13 09/29/14  Levonne Spiller, MD  amiodarone (PACERONE) 200 MG tablet Take 1 tablet (200 mg total) by mouth daily. 10/22/13   Satira Sark, MD  amitriptyline (ELAVIL) 25 MG tablet Take 1 tablet (25 mg total) by mouth at bedtime. 09/29/13   Levonne Spiller, MD  amLODipine (NORVASC) 10 MG tablet Take 1 tablet (10 mg total) by mouth daily. 01/24/11   Asencion Noble, MD  atorvastatin (LIPITOR) 40 MG tablet Take 1 tablet by mouth daily. 03/06/13   Historical Provider, MD  diclofenac sodium (VOLTAREN) 1 % GEL Apply 2 g topically 4 (four) times daily.    Historical Provider, MD  donepezil (ARICEPT) 5 MG tablet Take 5 mg by mouth at bedtime.  03/21/11   Historical Provider, MD  FLUoxetine (PROZAC) 20 MG capsule Take 1 capsule (20 mg total) by mouth daily. 09/29/13 09/29/14  Levonne Spiller, MD  gabapentin (NEURONTIN)  100 MG capsule Take 100 mg by mouth daily.    Historical Provider, MD  ipratropium (ATROVENT) 0.02 % nebulizer solution Take 500 mcg by nebulization 4 (four) times daily as needed. Shortness of Breath 11/16/11   Janice Norrie, MD  labetalol (NORMODYNE) 100 MG tablet Take 100 mg by mouth 2 (two) times daily.     Historical Provider, MD  methocarbamol (ROBAXIN) 500 MG tablet TAKE (1) TABLET BY MOUTH FOUR TIMES DAILY. 07/10/12   Darrol Jump, MD  omeprazole (PRILOSEC) 20 MG capsule Take 20 mg by mouth daily.    Historical Provider, MD  oxyCODONE (OXY IR/ROXICODONE) 5 MG immediate release tablet Take 5 mg by mouth every 6 (six) hours as needed for moderate pain. pain 08/21/11   Historical Provider, MD  risperiDONE (RISPERDAL) 2 MG tablet Take 1 tablet (2 mg total) by mouth at bedtime. 09/29/13 09/29/14  Levonne Spiller, MD   BP 127/63 mmHg  Pulse 51  Temp(Src) 97.9 F (36.6 C) (Oral)  Resp 9  Ht $R'4\' 11"'wh$  (1.499 m)  Wt 216 lb (97.977 kg)  BMI 43.60 kg/m2  SpO2 100% Physical Exam  Constitutional: She is oriented to person, place, and time.  obese  HENT:  Head: Normocephalic and atraumatic.  Eyes: Conjunctivae and EOM are normal. Pupils are equal, round, and reactive to light.  Neck: Normal range of motion. Neck supple.  Cardiovascular: Normal rate, regular rhythm and normal heart sounds.   Pulmonary/Chest: Effort normal and breath sounds normal.  Abdominal: Soft. Bowel sounds are normal.  Musculoskeletal: Normal range of motion.  Neurological: She is alert and oriented to person, place, and time.  Skin: Skin is warm and dry.  Psychiatric: She has a normal mood and affect. Her behavior is normal.  Nursing note and vitals reviewed.   ED Course  Procedures (including critical care time)  DIAGNOSTIC STUDIES: Oxygen Saturation is 91% on [place], adequate by my interpretation.    COORDINATION OF CARE: 7:24 AM Discussed treatment plan with patient at beside, including basic blood work and urinalysis. The  patient agrees with the plan and has no further questions at this time.  Labs Review Labs Reviewed  BASIC METABOLIC PANEL - Abnormal; Notable for the following:    Sodium 133 (*)    Chloride 91 (*)    BUN 37 (*)    Creatinine, Ser 12.11 (*)    GFR calc non Af Amer 3 (*)    GFR calc Af Amer 3 (*)    Anion gap 18 (*)    All other components within  normal limits  CBC WITH DIFFERENTIAL - Abnormal; Notable for the following:    RBC 3.66 (*)    Hemoglobin 10.7 (*)    HCT 33.9 (*)    Eosinophils Relative 9 (*)    All other components within normal limits  TROPONIN I  URINALYSIS, ROUTINE W REFLEX MICROSCOPIC    Imaging Review Dg Chest 2 View  12/07/2013   CLINICAL DATA:  Shortness of breath.  EXAM: CHEST  2 VIEW  COMPARISON:  08/12/2013, 03/30/2013.  FINDINGS: Dialysis catheter noted on prior chest x-ray 08/12/2013 has been removed. Left subclavian stent. Mediastinum unremarkable. Cardiomegaly is present. Pulmonary venous congestion noted. Mild basilar atelectasis and or infiltrates noted. Mild component congestive heart failure may be present. Small pleural effusions cannot be excluded. These findings suggest mild congestive heart failure. No acute osseous abnormality.  IMPRESSION: 1. Left subclavian stent . 2. Cardiomegaly. Pulmonary vascular congestion. A mild component of pulmonary interstitial edema and small pleural effusions cannot be excluded. 3. Mild bibasilar subsegmental atelectasis and/or infiltrates.   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 She was scheduled for dialysis this morning.  Patient is in no acute distress. Screening labs show elevated creatinine consistent with end-stage renal disease. Patient will be discharged to dialysis.   Nat Christen, MD 12/07/13 [phone]

## 2013-12-07 NOTE — ED Notes (Signed)
Patient anuric, therefore can not collect UA

## 2013-12-07 NOTE — ED Notes (Addendum)
Charge nurse, Verlon AuLeslie in room to discharge patient, after reviewing discharge instructions a female visitor became very irriate, could be heard yelling at nursing station; upset because the patient has been having mobility issues and she feels we have not done anything to address these. Dr. Adriana Simasook made aware of visitors concern, will step in and speak with patient and visitors; however they turned down the offer to wait and speak with Dr. Adriana Simasook

## 2013-12-08 NOTE — Telephone Encounter (Signed)
You can send in refill, make sure she makes an appointment

## 2013-12-14 ENCOUNTER — Other Ambulatory Visit (HOSPITAL_COMMUNITY): Payer: Self-pay | Admitting: *Deleted

## 2013-12-14 MED ORDER — RISPERIDONE 2 MG PO TABS: 2.0000 mg | ORAL_TABLET | Freq: Every day | ORAL | Status: DC

## 2013-12-14 NOTE — Telephone Encounter (Signed)
Per Dr. Tenny Crawoss to fill pt medication to last her until next appt due to pharmacy request

## 2013-12-14 NOTE — Telephone Encounter (Signed)
meds refilled 

## 2013-12-28 ENCOUNTER — Other Ambulatory Visit (HOSPITAL_COMMUNITY): Payer: Self-pay | Admitting: Psychiatry

## 2013-12-29 ENCOUNTER — Other Ambulatory Visit (HOSPITAL_COMMUNITY): Payer: Self-pay | Admitting: *Deleted

## 2013-12-29 ENCOUNTER — Telehealth (HOSPITAL_COMMUNITY): Payer: Self-pay | Admitting: *Deleted

## 2013-12-29 MED ORDER — ALPRAZOLAM 0.5 MG PO TABS: 0.5000 mg | ORAL_TABLET | Freq: Three times a day (TID) | ORAL | Status: DC

## 2013-12-29 NOTE — Telephone Encounter (Signed)
You may call in one month supply. Also call her to reschedule appointment

## 2013-12-29 NOTE — Telephone Encounter (Signed)
Called in refills to pharmacy and spoke with Herbert SetaHeather

## 2013-12-29 NOTE — Telephone Encounter (Signed)
Pt pharmacy requesting refills for pt Xanax. Per Dr. Tenny Crawoss to call in pt Xanax to her pharmacy. Called pharmacy and spoke with Herbert SetaHeather

## 2013-12-29 NOTE — Telephone Encounter (Signed)
Pt pharmacy sent refill request for pt Alprazolam. Pt medication was last filled 09-29-13 with 90 tablets 2 refills. Called pharmacy to see if pt had any prescriptions on hold in their system and per pharmacist, pt is completely out of prescriptions and need refills. Pharmacy number is [phone].

## 2013-12-31 ENCOUNTER — Telehealth (HOSPITAL_COMMUNITY): Payer: Self-pay | Admitting: *Deleted

## 2013-12-31 NOTE — Telephone Encounter (Signed)
Please call in 30 day supplies

## 2013-12-31 NOTE — Telephone Encounter (Signed)
Pt pharmacy requesting refills for pt Amitriptyline 25 mg QHS and pt Fluoxetine 20 mg QD. Both medications were last filled 09-29-13 30 tablets with 2 refills. Pt follow appt is scheduled for 01-19-14. Pharmacy number is [phone].

## 2014-01-01 ENCOUNTER — Other Ambulatory Visit (HOSPITAL_COMMUNITY): Payer: Self-pay | Admitting: *Deleted

## 2014-01-01 MED ORDER — AMITRIPTYLINE HCL 25 MG PO TABS: 25.0000 mg | ORAL_TABLET | Freq: Every day | ORAL | Status: DC

## 2014-01-01 MED ORDER — AMIODARONE HCL 200 MG PO TABS
200.0000 mg | ORAL_TABLET | Freq: Every day | ORAL | Status: DC
Start: 2014-01-01 — End: 2014-03-09

## 2014-01-01 NOTE — Telephone Encounter (Signed)
Per Dr. Tenny Crawoss to go ahead and fill this medication for pt. For 30 day supply

## 2014-01-01 NOTE — Telephone Encounter (Signed)
Medication sent to pharmacy  

## 2014-01-06 ENCOUNTER — Telehealth (HOSPITAL_COMMUNITY): Payer: Self-pay | Admitting: *Deleted

## 2014-01-06 NOTE — Telephone Encounter (Signed)
pharmacy fax refill request for Fluoxetine HCL 20 mg. caps.

## 2014-01-11 ENCOUNTER — Telehealth (HOSPITAL_COMMUNITY): Payer: Self-pay | Admitting: *Deleted

## 2014-01-11 ENCOUNTER — Other Ambulatory Visit (HOSPITAL_COMMUNITY): Payer: Self-pay | Admitting: *Deleted

## 2014-01-11 MED ORDER — FLUOXETINE HCL 20 MG PO CAPS: 20.0000 mg | ORAL_CAPSULE | Freq: Every day | ORAL | Status: DC

## 2014-01-11 NOTE — Telephone Encounter (Signed)
Medication sent to pharmacy  

## 2014-01-11 NOTE — Telephone Encounter (Signed)
Per Dr. Tenny Crawoss to refill pt medications for 30 days supply only.

## 2014-01-11 NOTE — Telephone Encounter (Signed)
Ext. [phone].   phone voice message from [facility], request Fluoxetine 20 mg.

## 2014-01-11 NOTE — Telephone Encounter (Signed)
Medication already sent to pharmacy

## 2014-01-11 NOTE — Telephone Encounter (Signed)
lmtcb on pt home number. Number provided

## 2014-01-19 ENCOUNTER — Encounter (HOSPITAL_COMMUNITY): Payer: Self-pay | Admitting: Psychiatry

## 2014-01-19 ENCOUNTER — Ambulatory Visit (INDEPENDENT_AMBULATORY_CARE_PROVIDER_SITE_OTHER): Payer: Medicare Other | Admitting: Psychiatry

## 2014-01-19 VITALS — BP 190/90 | HR 66 | Ht 59.0 in

## 2014-01-19 DIAGNOSIS — F250 Schizoaffective disorder, bipolar type: Secondary | ICD-10-CM

## 2014-01-19 DIAGNOSIS — F259 Schizoaffective disorder, unspecified: Secondary | ICD-10-CM

## 2014-01-19 DIAGNOSIS — F329 Major depressive disorder, single episode, unspecified: Secondary | ICD-10-CM

## 2014-01-19 DIAGNOSIS — F419 Anxiety disorder, unspecified: Secondary | ICD-10-CM

## 2014-01-19 MED ORDER — ALPRAZOLAM 0.5 MG PO TABS: 0.5000 mg | ORAL_TABLET | Freq: Three times a day (TID) | ORAL | Status: DC

## 2014-01-19 MED ORDER — RISPERIDONE 2 MG PO TABS: 2.0000 mg | ORAL_TABLET | Freq: Every day | ORAL | Status: DC

## 2014-01-19 MED ORDER — AMITRIPTYLINE HCL 25 MG PO TABS: 25.0000 mg | ORAL_TABLET | Freq: Every day | ORAL | Status: DC
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
## 2014-02-01 ENCOUNTER — Ambulatory Visit (HOSPITAL_COMMUNITY): Payer: Self-pay

## 2014-02-03 ENCOUNTER — Encounter (HOSPITAL_COMMUNITY): Payer: Medicare Other | Attending: Hematology & Oncology

## 2014-02-03 ENCOUNTER — Encounter (HOSPITAL_BASED_OUTPATIENT_CLINIC_OR_DEPARTMENT_OTHER): Payer: Medicare Other

## 2014-02-03 VITALS — BP 205/76 | HR 78 | Resp 16

## 2014-02-03 DIAGNOSIS — N189 Chronic kidney disease, unspecified: Secondary | ICD-10-CM

## 2014-02-03 DIAGNOSIS — M810 Age-related osteoporosis without current pathological fracture: Secondary | ICD-10-CM

## 2014-02-03 DIAGNOSIS — D649 Anemia, unspecified: Secondary | ICD-10-CM

## 2014-02-03 LAB — BASIC METABOLIC PANEL
Anion gap: 8 (ref 5–15)
BUN: 8 mg/dL (ref 6–23)
CALCIUM: 8.7 mg/dL (ref 8.4–10.5)
CO2: 32 mmol/L (ref 19–32)
CREATININE: 4.39 mg/dL — AB (ref 0.50–1.10)
Chloride: 97 mEq/L (ref 96–112)
GFR calc Af Amer: 11 mL/min — ABNORMAL LOW (ref >90)
GFR, EST NON AFRICAN AMERICAN: 9 mL/min — AB (ref >90)
Glucose, Bld: 104 mg/dL — ABNORMAL HIGH (ref 70–99)
Potassium: 3.3 mmol/L — ABNORMAL LOW (ref 3.5–5.1)
Sodium: 137 mmol/L (ref 135–145)

## 2014-02-03 MED ORDER — DENOSUMAB 60 MG/ML ~~LOC~~ SOLN
60.0000 mg | Freq: Once | SUBCUTANEOUS | Status: AC
  Administered 2014-02-03: 60 mg via SUBCUTANEOUS

## 2014-02-03 NOTE — Progress Notes (Signed)
Laurey MoraleGracie M Dullea's reason for visit today is for an injection and labs as scheduled per MD orders.  Labs were drawn prior to administration of ordered medication.  Venipuncture performed with a 23 gauge butterfly needle to R Antecubital.  Laurey MoraleGracie M Sartwell also received Prolia per MD orders; [facility].  Laurey MoraleGracie M Szafran tolerated all procedures well and without incident; questions were answered and patient was discharged.  Patient scheduled for OV and 6 month Prolia.

## 2014-02-03 NOTE — Progress Notes (Signed)
Labs for bmp 

## 2014-02-04 ENCOUNTER — Other Ambulatory Visit (HOSPITAL_COMMUNITY): Payer: Self-pay | Admitting: Oncology

## 2014-02-04 ENCOUNTER — Ambulatory Visit (HOSPITAL_COMMUNITY): Payer: Self-pay

## 2014-02-08 ENCOUNTER — Ambulatory Visit (HOSPITAL_COMMUNITY): Payer: Self-pay | Admitting: Oncology

## 2014-02-18 ENCOUNTER — Other Ambulatory Visit (HOSPITAL_COMMUNITY): Payer: Self-pay | Admitting: Oncology

## 2014-03-02 ENCOUNTER — Other Ambulatory Visit (HOSPITAL_COMMUNITY): Payer: Self-pay | Admitting: Psychiatry

## 2014-03-09 ENCOUNTER — Other Ambulatory Visit: Payer: Self-pay | Admitting: Cardiology

## 2014-03-11 ENCOUNTER — Telehealth (HOSPITAL_COMMUNITY): Payer: Self-pay | Admitting: *Deleted

## 2014-03-11 ENCOUNTER — Other Ambulatory Visit (HOSPITAL_COMMUNITY): Payer: Self-pay | Admitting: Psychiatry

## 2014-03-11 ENCOUNTER — Other Ambulatory Visit: Payer: Self-pay | Admitting: Cardiology

## 2014-03-11 MED ORDER — ALPRAZOLAM 0.5 MG PO TABS: 0.5000 mg | ORAL_TABLET | Freq: Three times a day (TID) | ORAL | Status: DC

## 2014-03-11 MED ORDER — AMIODARONE HCL 200 MG PO TABS: ORAL_TABLET | ORAL | Status: DC

## 2014-03-11 NOTE — Telephone Encounter (Signed)
lmtcb

## 2014-03-11 NOTE — Telephone Encounter (Signed)
Received fax refill request  Rx # [id number] Medication:  Amiodarone HC 200mg  tablet Qty 30 Sig:  Take one tablet by mouth once daily  Physician:  Diona BrownerMcDowell

## 2014-03-11 NOTE — Telephone Encounter (Signed)
Pt daughter calling asking for refills for pt Xanax. Per pt daughter Marcelino DusterMichelle, her father lost the script and pt is completely out of her medications. Pt daughter number is [phone]

## 2014-03-11 NOTE — Telephone Encounter (Signed)
Printed. I will only do this one time

## 2014-03-11 NOTE — Telephone Encounter (Signed)
escribed refill 

## 2014-03-12 ENCOUNTER — Telehealth (HOSPITAL_COMMUNITY): Payer: Self-pay | Admitting: *Deleted

## 2014-03-12 NOTE — Telephone Encounter (Signed)
Pt daughter Wynonia LawmanJuanita Mccain came to pick pt Printed medication script up. Pt daughter D/L number is [phone]. Pt daughter agrees with script

## 2014-03-12 NOTE — Telephone Encounter (Signed)
Pt daughter [name] is aware pt rx script is ready for pick up and shows understanding.

## 2014-03-30 ENCOUNTER — Other Ambulatory Visit (HOSPITAL_COMMUNITY): Payer: Self-pay | Admitting: Psychiatry

## 2014-04-14 ENCOUNTER — Emergency Department (HOSPITAL_COMMUNITY)
Admission: EM | Admit: 2014-04-14 | Discharge: 2014-04-14 | Disposition: A | Discharge status: Home / Self Care | Payer: Medicare Other | Attending: Emergency Medicine | Admitting: Emergency Medicine

## 2014-04-14 ENCOUNTER — Encounter (HOSPITAL_COMMUNITY): Payer: Self-pay

## 2014-04-14 ENCOUNTER — Emergency Department (HOSPITAL_COMMUNITY): Payer: Medicare Other

## 2014-04-14 DIAGNOSIS — N186 End stage renal disease: Secondary | ICD-10-CM | POA: Insufficient documentation

## 2014-04-14 DIAGNOSIS — Z87891 Personal history of nicotine dependence: Secondary | ICD-10-CM | POA: Insufficient documentation

## 2014-04-14 DIAGNOSIS — Y998 Other external cause status: Secondary | ICD-10-CM | POA: Insufficient documentation

## 2014-04-14 DIAGNOSIS — Y9389 Activity, other specified: Secondary | ICD-10-CM | POA: Insufficient documentation

## 2014-04-14 DIAGNOSIS — G629 Polyneuropathy, unspecified: Secondary | ICD-10-CM | POA: Diagnosis not present

## 2014-04-14 DIAGNOSIS — W1839XA Other fall on same level, initial encounter: Secondary | ICD-10-CM | POA: Diagnosis not present

## 2014-04-14 DIAGNOSIS — Z862 Personal history of diseases of the blood and blood-forming organs and certain disorders involving the immune mechanism: Secondary | ICD-10-CM | POA: Insufficient documentation

## 2014-04-14 DIAGNOSIS — Z79899 Other long term (current) drug therapy: Secondary | ICD-10-CM | POA: Diagnosis not present

## 2014-04-14 DIAGNOSIS — Z791 Long term (current) use of non-steroidal anti-inflammatories (NSAID): Secondary | ICD-10-CM | POA: Insufficient documentation

## 2014-04-14 DIAGNOSIS — M1990 Unspecified osteoarthritis, unspecified site: Secondary | ICD-10-CM | POA: Insufficient documentation

## 2014-04-14 DIAGNOSIS — J45909 Unspecified asthma, uncomplicated: Secondary | ICD-10-CM | POA: Insufficient documentation

## 2014-04-14 DIAGNOSIS — I120 Hypertensive chronic kidney disease with stage 5 chronic kidney disease or end stage renal disease: Secondary | ICD-10-CM | POA: Insufficient documentation

## 2014-04-14 DIAGNOSIS — F259 Schizoaffective disorder, unspecified: Secondary | ICD-10-CM | POA: Diagnosis not present

## 2014-04-14 DIAGNOSIS — Y92002 Bathroom of unspecified non-institutional (private) residence single-family (private) house as the place of occurrence of the external cause: Secondary | ICD-10-CM | POA: Diagnosis not present

## 2014-04-14 DIAGNOSIS — W19XXXA Unspecified fall, initial encounter: Secondary | ICD-10-CM

## 2014-04-14 DIAGNOSIS — E780 Pure hypercholesterolemia: Secondary | ICD-10-CM | POA: Diagnosis not present

## 2014-04-14 DIAGNOSIS — Z8701 Personal history of pneumonia (recurrent): Secondary | ICD-10-CM | POA: Insufficient documentation

## 2014-04-14 DIAGNOSIS — K219 Gastro-esophageal reflux disease without esophagitis: Secondary | ICD-10-CM | POA: Diagnosis not present

## 2014-04-14 DIAGNOSIS — S73101A Unspecified sprain of right hip, initial encounter: Secondary | ICD-10-CM | POA: Diagnosis not present

## 2014-04-14 DIAGNOSIS — Z992 Dependence on renal dialysis: Secondary | ICD-10-CM | POA: Diagnosis not present

## 2014-04-14 DIAGNOSIS — R52 Pain, unspecified: Secondary | ICD-10-CM

## 2014-04-14 DIAGNOSIS — S79911A Unspecified injury of right hip, initial encounter: Secondary | ICD-10-CM | POA: Diagnosis present

## 2014-04-14 MED ORDER — ACETAMINOPHEN 325 MG PO TABS
650.0000 mg | ORAL_TABLET | Freq: Once | ORAL | Status: AC
  Administered 2014-04-14: 650 mg via ORAL
  Filled 2014-04-14: qty 2

## 2014-04-14 NOTE — ED Notes (Signed)
Pt reports has difficulty walking and fell in her bathroom last night.  Reports R hip feels sore.  Pt reports went to dialysis this morning but they instructed her to come to Ed for evaluation before receiving dialysis.

## 2014-04-14 NOTE — ED Notes (Signed)
amblated pt from bed to chair; pt was able to walk with assistance and stated: "my hip feels sore"

## 2014-04-14 NOTE — ED Provider Notes (Signed)
CSN: [id number]     Arrival date & time 04/14/14  0704 History  This chart was scribed for Zadie Rhine, MD by Luisa Dago, Medical Scribe. This patient was seen in room APA06/APA06 and the patient's care was started at 7:23 AM.    Chief Complaint  Patient presents with  . Fall   Patient is a 74 y.o. female presenting with fall. The history is provided by the patient, medical records and the spouse. No language interpreter was used.  Fall This is a new problem. The current episode started 6 to 12 hours ago. The problem occurs rarely. The problem has not changed since onset.Pertinent negatives include no chest pain, no abdominal pain, no headaches and no shortness of breath. The symptoms are aggravated by walking (movement). Nothing relieves the symptoms. She has tried nothing for the symptoms.   HPI Comments: [name] is a 74 y.o. female who presents to the Emergency Department complaining of a fall that occurred last night. Pt was standing at the sink when she fell back unto her buttock. She is now complaining of associated right hip pain. Pt states that she has a h/o of severe arthritis which she is treated with a Cortizone injection every 2 weeks. She denies any head trauma or LOC. Pt is currently a dialysis pt and was asked to present to the ED to be cleared to receive her scheduled dialysis today.  Pt denies any fever, chills, nausea, abdominal pain, emesis, SOB, or chest pain.  She reports she has difficulty standing at times due to arthritic pain  Past Medical History  Diagnosis Date  . Essential hypertension, benign   . Hypercholesterolemia   . GERD (gastroesophageal reflux disease)   . Asthma   . Osteoporosis   . ESRD (end stage renal disease) on dialysis   . Anemia of chronic disease   . Peripheral neuropathy   . Schizoaffective disorder   . Arthritis   . History of pneumonia    Past Surgical History  Procedure Laterality Date  . Tubal ligation    . Breast  biopsy    . Refractive surgery      Right eye  . Bone marrow aspiration    . Bone marrow biopsy    . Insertion of dialysis catheter  01/23/2011    Procedure: INSERTION OF DIALYSIS CATHETER;  Surgeon: Chuck Hint, MD;  Location: [facility] OR;  Service: Vascular;  Laterality: N/A;  Insertion of Dialysis Catheter- Right Internal Jugular  . Yag laser application  12/11/2011    Procedure: YAG LASER APPLICATION;  Surgeon: Loraine Leriche T. Nile Riggs, MD;  Location: [facility] ORS;  Service: Ophthalmology;  Laterality: Left;   Family History  Problem Relation Age of Onset  . Alcohol abuse Brother   . Bipolar disorder Brother   . Dementia Mother   . Depression Mother   . ADD / ADHD Neg Hx   . Anxiety disorder Neg Hx   . Drug abuse Neg Hx   . OCD Neg Hx   . Paranoid behavior Neg Hx   . Schizophrenia Neg Hx   . Seizures Neg Hx   . Physical abuse Neg Hx   . Sexual abuse Neg Hx   . Healthy Daughter   . Healthy Daughter   . Healthy Daughter    History  Substance Use Topics  . Smoking status: Former Smoker    Types: Cigarettes  . Smokeless tobacco: Former Neurosurgeon    Quit date: 10/22/2001  . Alcohol Use: No  OB History    No data available     Review of Systems  Constitutional: Negative for fever.  Respiratory: Negative for shortness of breath.   Cardiovascular: Negative for chest pain.  Gastrointestinal: Negative for abdominal pain.  Musculoskeletal: Positive for arthralgias. Negative for myalgias and joint swelling.  Neurological: Negative for dizziness, weakness, numbness and headaches.  All other systems reviewed and are negative.  Allergies  Neurontin; Ativan; and Ferrous gluconate  Home Medications   Prior to Admission medications   Medication Sig Start Date End Date Taking? Authorizing Provider  ALPRAZolam Duanne Moron) 0.5 MG tablet Take 1 tablet (0.5 mg total) by mouth 3 (three) times daily. 03/11/14 03/11/15  Cloria Spring, MD  amiodarone (PACERONE) 200 MG tablet TAKE (1) TABLET BY MOUTH  ONCE DAILY. 03/11/14   Satira Sark, MD  amitriptyline (ELAVIL) 25 MG tablet Take 1 tablet (25 mg total) by mouth at bedtime. 01/19/14   Cloria Spring, MD  amLODipine (NORVASC) 10 MG tablet Take 1 tablet (10 mg total) by mouth daily. 01/24/11   Asencion Noble, MD  atorvastatin (LIPITOR) 40 MG tablet Take 1 tablet by mouth daily. 03/06/13   Historical Provider, MD  diclofenac sodium (VOLTAREN) 1 % GEL Apply 2 g topically 4 (four) times daily.    Historical Provider, MD  donepezil (ARICEPT) 5 MG tablet Take 5 mg by mouth at bedtime.  03/21/11   Historical Provider, MD  gabapentin (NEURONTIN) 100 MG capsule Take 100 mg by mouth daily.    Historical Provider, MD  ipratropium (ATROVENT) 0.02 % nebulizer solution Take 500 mcg by nebulization 4 (four) times daily as needed. Shortness of Breath 11/16/11   Rolland Porter, MD  labetalol (NORMODYNE) 100 MG tablet Take 100 mg by mouth 2 (two) times daily.     Historical Provider, MD  methocarbamol (ROBAXIN) 500 MG tablet TAKE (1) TABLET BY MOUTH FOUR TIMES DAILY. 07/10/12   Darrol Jump, MD  omeprazole (PRILOSEC) 20 MG capsule Take 20 mg by mouth daily.    Historical Provider, MD  oxyCODONE (OXY IR/ROXICODONE) 5 MG immediate release tablet Take 5 mg by mouth every 6 (six) hours as needed for moderate pain. pain 08/21/11   Historical Provider, MD  risperiDONE (RISPERDAL) 2 MG tablet Take 1 tablet (2 mg total) by mouth at bedtime. 01/19/14 01/19/15  Cloria Spring, MD   BP 184/66 mmHg  Pulse 73  Temp(Src) 98 F (36.7 C) (Oral)  Resp 20  Ht $R'4\' 11"'Ac$  (1.499 m)  Wt 212 lb (96.163 kg)  BMI 42.80 kg/m2  SpO2 93%   Physical Exam  Nursing note and vitals reviewed. CONSTITUTIONAL: Well developed/well nourished HEAD: Normocephalic/atraumatic EYES: EOMI/PERRL ENMT: Mucous membranes moist NECK: supple no meningeal signs SPINE/BACK:entire spine nontender CV: S1/S2 noted, no murmurs/rubs/gallops noted LUNGS: Lungs are clear to auscultation bilaterally, no apparent  distress ABDOMEN: soft, nontender, no rebound or guarding, bowel sounds noted throughout abdomen GU:no cva tenderness NEURO: Pt is awake/alert/appropriate, moves all extremitiesx4.  No facial droop.   EXTREMITIES: pulses normal/equal, full ROM; dialysis access to left arm and thrill noted. Tenderness with ROM of right hip, All other extremities/joints palpated/ranged and nontender SKIN: warm, color normal PSYCH: no abnormalities of mood noted, alert and oriented to situation   ED Course  Procedures   DIAGNOSTIC STUDIES: Oxygen Saturation is 93% on RA, low by my interpretation.    COORDINATION OF CARE: 7:29 AM- Pt advised of plan for treatment and pt agrees.  8:17 AM Xray negative  pt  has no other complaints She can stand without pain Will d/c to dialysis  Medications  acetaminophen (TYLENOL) tablet 650 mg (650 mg Oral Given 04/14/14 0746)    Imaging Review Dg Hip Unilat With Pelvis 2-3 Views Right  04/14/2014   CLINICAL DATA:  Right hip pain post fall yesterday  EXAM: RIGHT HIP (WITH PELVIS) 2-3 VIEWS  COMPARISON:  None.  FINDINGS: Three views of the right hip submitted. There is diffuse osteopenia. No acute fracture or subluxation. Degenerative changes are noted bilateral hip joints with mild narrowing of superior hip joint space. Degenerative changes pubic symphysis. Pelvic phleboliths are noted. Mild degenerative changes bilateral SI joints.  IMPRESSION: No acute fracture or subluxation. Diffuse osteopenia. Degenerative changes.   Electronically Signed   By: Lahoma Crocker M.D.   On: 04/14/2014 08:09     MDM   Final diagnoses:  Pain  Fall from standing, initial encounter  Hip sprain, right, initial encounter    Nursing notes including past medical history and social history reviewed and considered in documentation xrays/imaging reviewed by myself and considered during evaluation    I personally performed the services described in this documentation, which was scribed in  my presence. The recorded information has been reviewed and is accurate.      Ripley Fraise, MD 04/14/14 [phone]

## 2014-04-21 ENCOUNTER — Ambulatory Visit (HOSPITAL_COMMUNITY)
Admission: RE | Admit: 2014-04-21 | Discharge: 2014-04-21 | Disposition: A | Discharge status: Home / Self Care | Payer: Medicare Other | Source: Ambulatory Visit | Attending: Internal Medicine | Admitting: Internal Medicine

## 2014-04-21 ENCOUNTER — Other Ambulatory Visit (HOSPITAL_COMMUNITY): Payer: Self-pay | Admitting: Internal Medicine

## 2014-04-21 DIAGNOSIS — R0602 Shortness of breath: Secondary | ICD-10-CM | POA: Insufficient documentation

## 2014-04-21 DIAGNOSIS — R079 Chest pain, unspecified: Secondary | ICD-10-CM | POA: Insufficient documentation

## 2014-04-21 DIAGNOSIS — J45909 Unspecified asthma, uncomplicated: Secondary | ICD-10-CM | POA: Diagnosis not present

## 2014-04-21 DIAGNOSIS — Z8701 Personal history of pneumonia (recurrent): Secondary | ICD-10-CM | POA: Diagnosis not present

## 2014-04-21 DIAGNOSIS — R509 Fever, unspecified: Secondary | ICD-10-CM | POA: Insufficient documentation

## 2014-04-21 DIAGNOSIS — J449 Chronic obstructive pulmonary disease, unspecified: Secondary | ICD-10-CM | POA: Diagnosis not present

## 2014-04-29 ENCOUNTER — Other Ambulatory Visit (HOSPITAL_COMMUNITY): Payer: Self-pay | Admitting: Psychiatry

## 2014-05-03 ENCOUNTER — Telehealth (HOSPITAL_COMMUNITY): Payer: Self-pay | Admitting: *Deleted

## 2014-05-03 NOTE — Telephone Encounter (Signed)
Pt pharmacy requesting refill for pt Risperidone 2 mg QD. Pt last appt was 01-19-14 and medication was refilled that day with 30 tablets 2 refills. Pt f/u appt is scheduled for 05-20-14. Pt pharmacy number is [phone].

## 2014-05-04 ENCOUNTER — Ambulatory Visit (INDEPENDENT_AMBULATORY_CARE_PROVIDER_SITE_OTHER): Payer: Medicare Other | Admitting: Internal Medicine

## 2014-05-04 ENCOUNTER — Ambulatory Visit: Payer: Self-pay | Admitting: Internal Medicine

## 2014-05-04 ENCOUNTER — Encounter: Payer: Self-pay | Admitting: Internal Medicine

## 2014-05-04 ENCOUNTER — Other Ambulatory Visit (HOSPITAL_COMMUNITY): Payer: Self-pay | Admitting: Psychiatry

## 2014-05-04 ENCOUNTER — Other Ambulatory Visit: Payer: Self-pay

## 2014-05-04 VITALS — BP 149/67 | HR 90 | Temp 98.1°F | Ht 62.0 in | Wt 215.0 lb

## 2014-05-04 DIAGNOSIS — Q394 Esophageal web: Secondary | ICD-10-CM

## 2014-05-04 DIAGNOSIS — K219 Gastro-esophageal reflux disease without esophagitis: Secondary | ICD-10-CM

## 2014-05-04 DIAGNOSIS — R1314 Dysphagia, pharyngoesophageal phase: Secondary | ICD-10-CM | POA: Diagnosis not present

## 2014-05-04 DIAGNOSIS — K222 Esophageal obstruction: Secondary | ICD-10-CM

## 2014-05-04 MED ORDER — RISPERIDONE 2 MG PO TABS: 2.0000 mg | ORAL_TABLET | Freq: Every day | ORAL | Status: DC

## 2014-05-04 NOTE — Patient Instructions (Signed)
Schedule EGD with esophageal Dilation (recurrent dysphagia / history of schatzk's ring)  GERD information provided  Continue Protonix 40 mg daily  Further recommendations to follow

## 2014-05-04 NOTE — Telephone Encounter (Signed)
resent

## 2014-05-04 NOTE — Progress Notes (Signed)
  Primary Care Physician:  FAGAN,ROY, MD Primary Gastroenterologist:  Dr. Rourk Pre-Procedure History & Physical: HPI:  [name] is a 73 y.o. female here for for evaluation of recurrent esophageal dysphagia. Patient has long-standing GERD. In 2005, she underwent an EGD by me for dysphagia. She was found to have a Schatzki's ring which was dilated. This was associated with, essentially, resolution of dysphagia symptoms until about 6 or 7 years ago.  She has developed insidiously recurrent ntermittent episodes of dysphagia to solids and pills. Over the past 6 months, dysphagia has gotten much worse. Reflux symptoms continue to be an issue; she has breakthrough symptoms 2-3 times weekly. Patient reports Dr. Fagan switched her from omeprazole to pantoprazole 40 mg daily last year with equivocal improvement in symptoms. She has not had any melena or hematochezia. 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 She denies abdominal pain. She has dentures which fit fairly well. She reports being able to chew her food fairly well.  She reports being started on chronic hemodialysis since I last saw her in the office.  Past Medical History  Diagnosis Date  . Essential hypertension, benign   . Hypercholesterolemia   . GERD (gastroesophageal reflux disease)   . Asthma   . Osteoporosis   . ESRD (end stage renal disease) on dialysis   . Anemia of chronic disease   . Peripheral neuropathy   . Schizoaffective disorder   . Arthritis   . History of pneumonia     Past Surgical History  Procedure Laterality Date  . Tubal ligation    . Breast biopsy    . Refractive surgery      Right eye  . Bone marrow aspiration    . Bone marrow biopsy    . Insertion of dialysis catheter  01/23/2011    Procedure: INSERTION OF DIALYSIS CATHETER;  Surgeon: Christopher S [name], MD;  Location: [facility] OR;  Service: Vascular;  Laterality: N/A;  Insertion of Dialysis Catheter- Right Internal Jugular  . Yag  laser application  12/11/2011    Procedure: YAG LASER APPLICATION;  Surgeon: Mark T. Shapiro, MD;  Location: [facility] ORS;  Service: Ophthalmology;  Laterality: Left;  . Esophagogastroduodenoscopy  05/06/2003    Dr.Rourk- schatski's ring o/w normal esophagus, stomach and first and second portions of the duodenum s/p dilation  . Colonoscopy  03/10/2010    Dr.Rourk- pigmentation of the rectum and coln consistent with melanosis coli, o/w unremarkable appearing colonic and rectal mucosa.    Prior to Admission medications   Medication Sig Start Date End Date Taking? Authorizing Provider  ALPRAZolam (XANAX) 0.5 MG tablet Take 1 tablet (0.5 mg total) by mouth 3 (three) times daily. 03/11/14 03/11/15 Yes Deborah R Ross, MD  amiodarone (PACERONE) 200 MG tablet TAKE (1) TABLET BY MOUTH ONCE DAILY. 03/11/14  Yes Samuel G McDowell, MD  amitriptyline (ELAVIL) 25 MG tablet Take 1 tablet (25 mg total) by mouth at bedtime. 01/19/14  Yes Deborah R Ross, MD  amLODipine (NORVASC) 10 MG tablet Take 1 tablet (10 mg total) by mouth daily. 01/24/11  Yes Roy Fagan, MD  atorvastatin (LIPITOR) 40 MG tablet Take 1 tablet by mouth daily. 03/06/13  Yes Historical Provider, MD  diclofenac sodium (VOLTAREN) 1 % GEL Apply 2 g topically 4 (four) times daily.   Yes Historical Provider, MD  donepezil (ARICEPT) 5 MG tablet Take 5 mg by mouth at bedtime.  03/21/11  Yes Historical Provider, MD  gabapentin (NEURONTIN) 100 MG capsule Take 100 mg   by mouth daily.   Yes Historical Provider, MD  ipratropium (ATROVENT) 0.02 % nebulizer solution Take 500 mcg by nebulization 4 (four) times daily as needed. Shortness of Breath 11/16/11  Yes Iva Knapp, MD  labetalol (NORMODYNE) 100 MG tablet Take 100 mg by mouth 2 (two) times daily.    Yes Historical Provider, MD  methocarbamol (ROBAXIN) 500 MG tablet TAKE (1) TABLET BY MOUTH FOUR TIMES DAILY. 07/10/12  Yes Edwin O Walker, MD  omeprazole (PRILOSEC) 20 MG capsule Take 20 mg by mouth daily.   Yes Historical  Provider, MD  oxyCODONE (OXY IR/ROXICODONE) 5 MG immediate release tablet Take 5 mg by mouth every 6 (six) hours as needed for moderate pain. pain 08/21/11  Yes Historical Provider, MD  risperiDONE (RISPERDAL) 2 MG tablet Take 1 tablet (2 mg total) by mouth at bedtime. 05/04/14 05/04/15 Yes Deborah R Ross, MD    Allergies as of 05/04/2014 - Review Complete 05/04/2014  Allergen Reaction Noted  . Neurontin [gabapentin] Other (See Comments) 05/15/2012  . Ativan [lorazepam] Nausea Only and Other (See Comments) 12/24/2011  . Ferrous gluconate Nausea Only 06/27/2010    Family History  Problem Relation Age of Onset  . Alcohol abuse Brother   . Bipolar disorder Brother   . Dementia Mother   . Depression Mother   . ADD / ADHD Neg Hx   . Anxiety disorder Neg Hx   . Drug abuse Neg Hx   . OCD Neg Hx   . Paranoid behavior Neg Hx   . Schizophrenia Neg Hx   . Seizures Neg Hx   . Physical abuse Neg Hx   . Sexual abuse Neg Hx   . Healthy Daughter   . Healthy Daughter   . Healthy Daughter     History   Social History  . Marital Status: Married    Spouse Name: N/A  . Number of Children: N/A  . Years of Education: N/A   Occupational History  . Not on file.   Social History Main Topics  . Smoking status: Former Smoker    Types: Cigarettes  . Smokeless tobacco: Former User    Quit date: 10/22/2001  . Alcohol Use: No  . Drug Use: No  . Sexual Activity: Not on file   Other Topics Concern  . Not on file   Social History Narrative    Review of Systems: See HPI, otherwise negative ROS  Physical Exam: BP 149/67 mmHg  Pulse 90  Temp(Src) 98.1 F (36.7 C) (Oral)  Ht 5' 2" (1.575 m)  Wt 215 lb (97.523 kg)  BMI 39.31 kg/m2 General:   Alert,  Chronically ill appearing, pleasant and cooperative in NAD. Has bilateral knee braces in place. She is a wheelchair. Accompanied by her daughter, Juanita. Skin:  Intact without significant lesions or rashes. Eyes:  Sclera clear, no icterus.    Conjunctiva pink. Ears:  Normal auditory acuity. Nose:  No deformity, discharge,  or lesions. Mouth:  No deformity or lesions. Neck:  Supple; no masses or thyromegaly. No significant cervical adenopathy. Lungs:  Clear throughout to auscultation.   No wheezes, crackles, or rhonchi. No acute distress. Heart:  Regular rate and rhythm; 2/6 systolic ejection murmur., clicks, rubs,  or gallops. Abdomen: obese. normal bowel sounds.  Soft and nontender without appreciable mass or hepatosplenomegaly.  Pulses:  Normal pulses noted. Extremities:  Without clubbing or edema.  Impression:   Pleasant 73-year-old lady with multiple comorbidities now with recurrent esophageal dysphagia in a setting of a known   Schatzki's ring. She did enjoyed long-term improvement with esophageal dilation previously. GERD symptoms may not be under optimal control this time. I do not detect any other alarm symptoms. I suspect we are dealing with a benign process.    Recommendations:    Schedule EGD with esophageal Dilation (recurrent dysphagia / history of schatzk's ring).  The risks, benefits, limitations, alternatives and imponderables have been reviewed with the patient. Potential for esophageal dilation, biopsy, etc. have also been reviewed.  Questions have been answered. All parties agreeable.  GERD information provided  Continue Protonix 40 mg daily  Further recommendations to follow        Notice: This dictation was prepared with Dragon dictation along with smaller phrase technology. Any transcriptional errors that result from this process are unintentional and may not be corrected upon review. 

## 2014-05-09 NOTE — Op Note (Signed)
PATIENT NAME:  [name], Heather MR#:  [id number] DATE OF BIRTH:  02-23-1940  DATE OF PROCEDURE:  04/04/2011  PREOPERATIVE DIAGNOSES:  1. Complication AV dialysis device with non-maturation of a left brachiocephalic fistula.  2. Complication of dialysis device with poor flows in the tunneled catheter and inability to achieve adequate dialysis.  3. End-stage renal disease requiring hemodialysis.  4. Obesity.  5. Hypertension.   POSTOPERATIVE DIAGNOSES:  1. Complication AV dialysis device with non-maturation of a left brachiocephalic fistula.  2. Complication of dialysis device with poor flows in the tunneled catheter and inability to achieve adequate dialysis.  3. End-stage renal disease requiring hemodialysis.  4. Obesity.  5. Hypertension.   PROCEDURES PERFORMED:  1. Contrast injection to left brachiocephalic fistula.  2. TPA infusion for catheter clearance, right IJ tunneled catheter.   SURGEON: Renford DillsGregory G. Schnier, M.D.   SEDATION: Versed 2 mg plus fentanyl 50 mcg administered IV. Continuous ECG, pulse oximetry and cardiopulmonary monitoring was performed throughout the entire procedure by the interventional radiology nurse. Total sedation time is one hour.   ACCESS: 6 French sheath antegrade direction, left arm brachiocephalic fistula.   CONTRAST USED: Isovue 30 mL.   FLUORO TIME: Approximately four minutes.   INDICATIONS: [name] is a 74 year old woman who underwent construction of a fistula, but it has not matured. She is also having problems with flows in her catheter. The risks and benefits for treatment were reviewed, all questions are answered, and the patient agrees to proceed.   DESCRIPTION OF PROCEDURE: The patient is brought to special procedures holding area where attempts at aspirating her tunneled catheter confirm poor flow and therefore two aliquots of TPA, 2 mg in 50 mL, are reconstituted and 2-1/2 hour infusion is performed through both lumens simultaneously.    Once the fusion is complete, saline is hung and infused continuously to prevent rethrombosis. The patient is then positioned supine in the special procedure suite with her left arm extended palm upward. Ultrasound is placed in a sterile sleeve. Ultrasound is utilized secondary to lack of appropriate landmarks and to avoid vascular injury. Under direct ultrasound visualization, the cephalic vein is identified. It is echolucent, homogeneous, and easily compressible. Multiple punctures of the vein are made with the needle with good blood return; however, multiple times the wire would not negotiate the fistula. This resulted in several hematomas. Ultimately access was obtained inferiorly near the antecubital fossa and microsheath, followed by J-wire, followed by 6 French sheath was inserted. Hand injection of contrast was then utilized to demonstrate the fistula. After review of the images and reflux of contrast was utilized to demonstrate the arterial anastomosis, the sheath was pulled after pursestring suture was placed.   The tunnelled catheter was then clamped and 10,000 units of heparin was reconstituted in a total volume of 6 mL and approximately 3 mL were instilled into each lumen placing approximately 5000 units of heparin per lumen. This was after easy aspiration and flushing of the catheter was confirmed.   The patient tolerated the procedure well and there were no immediate complications, other than the small hematomas.   INTERPRETATION: Images of the left arm brachiocephalic fistula demonstrated it is extremely tortuous. Ultrasound guidance for insertion of the needle demonstrated the fistula lies approximately 2 cm deep. With reflux imaging, there is also greater than 80% stenosis of the vein just above the anastomosis. Central veins were patent and the visualized portions of the brachial artery were widely patent.   SUMMARY: Multiple problems with the  AV fistula including tortuosity, depth,  multiple side branches, and high-grade stenosis at the arterial anastomosis level. This was not discussed prior to this procedure with the patient; however, it would appear that radial artery access may allow a balloon to be utilized to dilate the in flow. Clearly with the tortuosity, a retrograde approach is not feasible. This will be discussed with the patient and with her agreement she will be arranged for treatment of her arterial anastomosis and then several weeks later she will require elevation, ligation of side branches, and reduction of the tortuosity. ____________________________ Renford Dills, MD ggs:slb D: 04/04/2011 16:50:42 ET T: 04/04/2011 17:25:21 ET JOB#: [id number]  cc: Renford Dills, MD, <Dictator> Renford Dills MD ELECTRONICALLY SIGNED 04/06/2011 17:44

## 2014-05-09 NOTE — Op Note (Signed)
PATIENT NAME:  [name], Heather MR#:  [id number] DATE OF BIRTH:  Nov 02, 1940  DATE OF PROCEDURE:  05/16/2011  PREOPERATIVE DIAGNOSES:  1. End-stage renal disease.  2. Nonfunctional left brachiocephalic AV fistula   POSTOPERATIVE DIAGNOSES:  1. End-stage renal disease.  2. Nonfunctional left brachiocephalic AV fistula   PROCEDURES PERFORMED: 1. Ultrasound guidance for vascular access, left upper extremity AV fistula.  2. Left upper extremity fistulogram.   SURGEON: Annice NeedyJason S. Mikiah Durall, M.D.   ANESTHESIA: Local with moderate conscious sedation.   ESTIMATED BLOOD LOSS: 25 mL.   INDICATION FOR PROCEDURE: This is a 74 year old African American female with end-stage renal disease. Her left brachiocephalic AV fistula has not been usable for dialysis. She is brought in for a fistulogram today. A previous ultrasound showed some areas of elevated velocities but no high-grade lesions. The vein was felt to be somewhat deep and tortuous and a fistulogram was performed to evaluate reasons for nonfunction.   DESCRIPTION OF PROCEDURE: The patient is brought to the vascular interventional radiology suite. The left upper extremity was sterilely prepped and draped and a sterile surgical field was created. The fistula was accessed just beyond the anastomosis with ultrasound guidance with a micropuncture needle and micropuncture wire and microsheath were placed.  A permanent image was recorded. Imaging was performed through the micropuncture sheath including holding pressure on the fistula to retrograde to evaluate the fistula anastomosis. This demonstrated a patent fistula without areas of significant stenosis. This was significantly tortuous and by our ultrasound evaluation of the arm was quite deep. There were several small branches as well, no dominant large branches that were prepped. At this point, I elected to terminate the procedure. The sheath was removed around a 4-0 Monocryl pursestring suture, pressure was held,  and the patient will be brought back for surgical revision and superficialization of her AV fistula.  ____________________________ Annice NeedyJason S. Shiana Rappleye, MD jsd:slb D: 05/16/2011 16:05:13 ET T: 05/16/2011 17:06:14 ET JOB#: [id number]  cc: Annice NeedyJason S. Rourke Mcquitty, MD, <Dictator> Annice NeedyJASON S Anshi Jalloh MD ELECTRONICALLY SIGNED 05/17/2011 13:56

## 2014-05-09 NOTE — Op Note (Signed)
PATIENT NAME:  [name], Kambri MR#:  [id number] DATE OF BIRTH:  Jul 31, 1940  DATE OF PROCEDURE:  04/18/2011  PREOPERATIVE DIAGNOSES:  1. Complication AV fistula.  2. Pain left upper extremity.  3. Failure to mature AV fistula left upper extremity.  4. Endstage renal disease requiring hemodialysis.   POSTOPERATIVE DIAGNOSES:   1. Complication AV fistula.  2. Pain left upper extremity.  3. Failure to mature AV fistula left upper extremity.  4. Endstage renal disease requiring hemodialysis.   PROCEDURES PERFORMED:  1. Left arm angiography limited study.  2. Percutaneous transluminal angioplasty of the anastomosis left brachiocephalic fistula to 6 mm.   SURGEON: Levora DredgeGregory Luvada Salamone, M.D.   SEDATION: Versed 3 mg plus fentanyl 100 mcg administered IV. Continuous ECG, pulse oximetry, and cardiopulmonary monitoring was performed throughout the entire procedure by the interventional radiology nurse. Total sedation time was 45 minutes.   ACCESS: 5 French sheath left radial artery.   CONTRAST USED: Isovue 20 mL.   FLUORO TIME: Approximately three minutes.   INDICATIONS: Ms. [name] is a 74 year old woman who has had multiple problems with her AV fistula, left brachiocephalic. She has developed a painful hematoma over her arm as well as pain in the left arm. Her fistula has not matured adequately to allow for easy cannulation. Recent ultrasound study demonstrated high-grade stenosis at the arterial anastomosis. The risks and benefits for angiography were reviewed. All questions are answered. The patient has agreed to proceed.   DESCRIPTION OF PROCEDURE: The patient is taken to Special Procedures and placed in the supine position. After adequate sedation is achieved, left arm is extended palm upward and prepped and draped in sterile fashion. Ultrasound is placed in a sterile sleeve. Ultrasound is utilized to lack of appropriate landmarks to avoid vascular injury. Under direct ultrasound visualization,  radial artery is noted. It is echolucent and pulsatile, indicating patency. 1% lidocaine is infiltrated in the soft tissues. Under direct ultrasound visualization the radial artery is identified. A micropuncture needle is inserted into the radial artery under direct ultrasound visualization. Image is recorded for the permanent record. Microwire followed micro sheath, J-wire followed by a 5 French sheath. 3000 units of heparin is given. 4000 units of heparin is given.   The 0.014 wire and 4 French glide catheter are then advanced into the mid brachial artery and hand injection of contrast is used to demonstrate the brachial artery down to the trifurcation, which is widely patent, and the origin of the fistula which demonstrates a high-grade stenosis. Wire is then negotiated into the fistula itself and first a 5 x 2 balloon and subsequently a 6 x 2 balloon is used to dilate the arterial anastomosis. Inflations are to 12 atmospheres for one minute. Follow-up angiography demonstrates resolution of the stricture with improved flow through the fistula.   The sheath is removed, pressure is held. 15 mg of protamine is given prior to removal of sheath.   INTERPRETATION: The images of the brachial artery and the arterial supply to the forearm are noted. They are widely patent. There is irregularity noted at the anastomotic level. Within the vein itself there is a fairly focal high-grade stricture. Following dilatation first to 5 mm and then to 6 mm, there appears to be resolution with improved flow through the fistula. Distal flow was also maintained.   SUMMARY: Successful treatment of anastomotic stricture in the left arm brachiocephalic fistula as described above.   ____________________________ Renford DillsGregory G. Jatavious Peppard, MD ggs:bjt D: 04/18/2011 16:00:16 ET T: 04/18/2011 16:44:13 ET  JOB#: [id number] Renford Dills MD ELECTRONICALLY SIGNED 04/25/2011 14:08

## 2014-05-09 NOTE — Op Note (Signed)
PATIENT NAME:  [name], [name] MR#:  [id number] DATE OF BIRTH:  09/11/1940  DATE OF PROCEDURE:  02/28/2011  PREOPERATIVE DIAGNOSIS: End-stage renal disease requiring hemodialysis.   POSTOPERATIVE DIAGNOSIS: End-stage renal disease requiring hemodialysis.   PROCEDURE PERFORMED: Creation of a left arm brachiocephalic fistula.   SURGEON: Renford DillsGregory G. Schnier, MD  ANESTHESIA: General by LMA.   FLUIDS: Per anesthesia record.   ESTIMATED BLOOD LOSS: Minimal.   SPECIMEN: None.   INDICATIONS: [name] is a 74 year old woman with significant renal insufficiency who will be requiring initiation of hemodialysis. She is therefore undergoing creation of a fistula. Risks and benefits were reviewed. All questions are answered. Patient agrees to proceed.   DESCRIPTION OF PROCEDURE: Patient is taken to the Operating Room, placed in supine position. After adequate general anesthesia is induced and appropriate invasive monitors are placed, she is positioned with her left arm extended palm upward. Left arm is prepped and draped in sterile fashion. 0.25% Sensorcaine is infiltrated in the skin curvilinear fashion across the antecubital fossa. C-shaped incision is then created with a 15 blade scalpel, dissected down through the soft tissues. Antecubital crossing vein is identified and looks to be approximately 4 to 5 mm in diameter. It is dissected circumferentially to allow for length to transfer over to the brachial artery. Once adequate vein has been dissected it is marked with a surgical marker. Brachial artery is then dissected and looped proximally and distally. Vein is then transected distally, irrigated with heparinized saline, dilated with Gerre PebblesGarrett coronary dilators and re-irrigated with heparinized saline. Brachial artery is then controlled with Silastic vessel loops. Arteriotomy is created with an 11 blade and extended with an aortic punch. The end of the vein to the side of the artery anastomosis is fashioned  with running 6-0 Prolene. Appropriate flushing maneuvers are performed and flow was established to the fistula first flushing both proximally and distally. Flow is then established to the rest. Palpable radial pulse is maintained.   Wound is inspected for hemostasis. Surgicel is placed around the incision and the incision is then closed in layers using interrupted 3-0 Vicryl followed by 4-0 Monocryl subcuticular and then Dermabond. Patient tolerated procedure well and there were no immediate complications. Sponge and needle counts were correct. She is taken to recovery room in stable condition.    ____________________________ Renford DillsGregory G. Schnier, MD ggs:cms D: 02/28/2011 15:30:38 ET T: 02/28/2011 17:25:19 ET JOB#: [id number]  cc: Renford DillsGregory G. Schnier, MD, <Dictator> Renford DillsGREGORY G SCHNIER MD ELECTRONICALLY SIGNED 03/12/2011 10:33

## 2014-05-09 NOTE — Op Note (Signed)
PATIENT NAME:  [name], [name] MR#:  [id number] DATE OF BIRTH:  02-Feb-1940  DATE OF PROCEDURE:  02/21/2011  PREOPERATIVE DIAGNOSIS: Complication of dialysis device with lack of flow in the tunneled dialysis catheter.   POSTOPERATIVE DIAGNOSIS: Complication of dialysis device with lack of flow in the tunneled dialysis catheter.   PROCEDURE PERFORMED: TPA infusion, right IJ cuffed tunneled dialysis catheter.   SURGEON: Renford DillsGregory G. Jaidy Cottam, M.D.   DESCRIPTION OF PROCEDURE: The patient is brought to special procedures holding area. Alteplase 2 mg is reconstituted in 50 mL of dextrose, 5%. Both aliquots are then run simultaneously through both lumens of the tunneled catheter. After three hours both lumens aspirate easily, flush well and are packed with 5000 units of heparin per lumen.   SUMMARY: Successful thrombolysis of cuffed tunneled dialysis catheter.  ____________________________ Renford DillsGregory G. Aubreigh Fuerte, MD ggs:slb D: 02/23/2011 16:00:08 ET T: 02/23/2011 16:45:07 ET JOB#: [id number]  cc: Renford DillsGregory G. Kesa Birky, MD, <Dictator> Renford DillsGREGORY G Evert Wenrich MD ELECTRONICALLY SIGNED 03/12/2011 10:33

## 2014-05-09 NOTE — Op Note (Signed)
PATIENT NAME:  [name], Heather MR#:  [id number] DATE OF BIRTH:  26-Mar-1940  DATE OF PROCEDURE:  06/01/2011  PREOPERATIVE DIAGNOSES:  1. Complication arteriovenous dialysis device with failure to cannulate.  2. End-stage renal disease requiring hemodialysis.   POSTOPERATIVE DIAGNOSES:  1. Complication arteriovenous dialysis device with failure to cannulate.  2. End-stage renal disease requiring hemodialysis.   PROCEDURE PERFORMED: Revision left arm brachiocephalic fistula.   SURGEON: Renford DillsGregory G. Schnier, MD  ANESTHESIA: General by LMA.   FLUIDS: Per anesthesia record.   ESTIMATED BLOOD LOSS: Minimal.   SPECIMEN: None.   INDICATIONS: Ms. [name] is a 74 year old woman who presents with a patent fistula that has not been able to be cannulated secondary to tortuosity and depth of the vein. She is undergoing revision. Risks and benefits were reviewed. All questions answered. Patient agrees to proceed.   DESCRIPTION OF PROCEDURE: Patient is taken to the Operating Room, placed in supine position. After adequate general anesthesia is induced, she is positioned supine with the left arm extended palm upward. Left arm is prepped and draped in sterile fashion. Linear incision is then created along the course of the cephalic vein and the incision is carried down through the soft tissues. Cephalic vein is exposed in its entirety. It is noted to be markedly tortuous. Approximately one-half dozen fairly significant side branches are ligated with 2-0 or 3-0 silk ties as needed. They are then divided. The vein itself is then dissected circumferentially and the soft tissues reapproximated at the fascial level under the vein. 3000 units of heparin is given. The vein is marked with a surgical marker. It is then clamped proximally and distally. The vein is then transected and approximally 2.5 cm is resected. An end-to-end anastomosis is then fashioned using interrupted 6-0 Prolene. Flushing maneuvers are performed  and flow is re-established to the fistula. Excellent thrill is noted.   The fistula is then laid in the bed on top of the fascia and the skin is closed directly over the fistula using a running 5-0 Monocryl and Dermabond. The patient tolerated the procedure well and there were no immediate complications. Sponge and needle counts are correct. She is taken to the recovery room in stable condition.   ____________________________ Renford DillsGregory G. Schnier, MD ggs:cms D: 06/01/2011 09:20:40 ET T: 06/01/2011 10:28:39 ET JOB#: [id number]  cc: Renford DillsGregory G. Schnier, MD, <Dictator> Kingsley Callanderoy O. Ouida SillsFagan, MD Renford DillsGREGORY G SCHNIER MD ELECTRONICALLY SIGNED 06/04/2011 17:41

## 2014-05-11 ENCOUNTER — Ambulatory Visit (HOSPITAL_COMMUNITY)
Admission: RE | Admit: 2014-05-11 | Discharge: 2014-05-11 | Disposition: A | Discharge status: Home / Self Care | Payer: Medicare Other | Source: Ambulatory Visit | Attending: Internal Medicine | Admitting: Internal Medicine

## 2014-05-11 ENCOUNTER — Encounter (HOSPITAL_COMMUNITY): Payer: Self-pay | Admitting: *Deleted

## 2014-05-11 ENCOUNTER — Encounter (HOSPITAL_COMMUNITY)
Admission: RE | Disposition: A | Discharge status: Home / Self Care | Payer: Self-pay | Source: Ambulatory Visit | Attending: Internal Medicine

## 2014-05-11 DIAGNOSIS — Z79899 Other long term (current) drug therapy: Secondary | ICD-10-CM | POA: Insufficient documentation

## 2014-05-11 DIAGNOSIS — R1310 Dysphagia, unspecified: Secondary | ICD-10-CM | POA: Diagnosis present

## 2014-05-11 DIAGNOSIS — K2950 Unspecified chronic gastritis without bleeding: Secondary | ICD-10-CM | POA: Diagnosis not present

## 2014-05-11 DIAGNOSIS — Z992 Dependence on renal dialysis: Secondary | ICD-10-CM | POA: Insufficient documentation

## 2014-05-11 DIAGNOSIS — K3189 Other diseases of stomach and duodenum: Secondary | ICD-10-CM | POA: Diagnosis not present

## 2014-05-11 DIAGNOSIS — G629 Polyneuropathy, unspecified: Secondary | ICD-10-CM | POA: Diagnosis not present

## 2014-05-11 DIAGNOSIS — R1314 Dysphagia, pharyngoesophageal phase: Secondary | ICD-10-CM

## 2014-05-11 DIAGNOSIS — K219 Gastro-esophageal reflux disease without esophagitis: Secondary | ICD-10-CM | POA: Insufficient documentation

## 2014-05-11 DIAGNOSIS — F259 Schizoaffective disorder, unspecified: Secondary | ICD-10-CM | POA: Diagnosis not present

## 2014-05-11 DIAGNOSIS — E780 Pure hypercholesterolemia: Secondary | ICD-10-CM | POA: Insufficient documentation

## 2014-05-11 DIAGNOSIS — K222 Esophageal obstruction: Secondary | ICD-10-CM | POA: Insufficient documentation

## 2014-05-11 DIAGNOSIS — K449 Diaphragmatic hernia without obstruction or gangrene: Secondary | ICD-10-CM | POA: Diagnosis not present

## 2014-05-11 DIAGNOSIS — N186 End stage renal disease: Secondary | ICD-10-CM | POA: Insufficient documentation

## 2014-05-11 DIAGNOSIS — M1990 Unspecified osteoarthritis, unspecified site: Secondary | ICD-10-CM | POA: Insufficient documentation

## 2014-05-11 DIAGNOSIS — Z79891 Long term (current) use of opiate analgesic: Secondary | ICD-10-CM | POA: Insufficient documentation

## 2014-05-11 DIAGNOSIS — M810 Age-related osteoporosis without current pathological fracture: Secondary | ICD-10-CM | POA: Insufficient documentation

## 2014-05-11 DIAGNOSIS — Z87891 Personal history of nicotine dependence: Secondary | ICD-10-CM | POA: Diagnosis not present

## 2014-05-11 DIAGNOSIS — Q394 Esophageal web: Secondary | ICD-10-CM

## 2014-05-11 DIAGNOSIS — I120 Hypertensive chronic kidney disease with stage 5 chronic kidney disease or end stage renal disease: Secondary | ICD-10-CM | POA: Diagnosis not present

## 2014-05-11 DIAGNOSIS — J45909 Unspecified asthma, uncomplicated: Secondary | ICD-10-CM | POA: Insufficient documentation

## 2014-05-11 DIAGNOSIS — Z791 Long term (current) use of non-steroidal anti-inflammatories (NSAID): Secondary | ICD-10-CM | POA: Diagnosis not present

## 2014-05-11 HISTORY — PX: ESOPHAGEAL DILATION: SHX303

## 2014-05-11 HISTORY — PX: ESOPHAGOGASTRODUODENOSCOPY: SHX5428

## 2014-05-11 SURGERY — EGD (ESOPHAGOGASTRODUODENOSCOPY)
Anesthesia: Moderate Sedation

## 2014-05-11 MED ORDER — SODIUM CHLORIDE 0.9 % IV SOLN
INTRAVENOUS | Status: DC
  Administered 2014-05-11: 250 mL via INTRAVENOUS

## 2014-05-11 MED ORDER — MIDAZOLAM HCL 5 MG/5ML IJ SOLN
INTRAMUSCULAR | Status: AC
  Filled 2014-05-11: qty 10

## 2014-05-11 MED ORDER — MIDAZOLAM HCL 5 MG/5ML IJ SOLN
INTRAMUSCULAR | Status: DC | PRN
  Administered 2014-05-11 (×2): 2 mg via INTRAVENOUS

## 2014-05-11 MED ORDER — LIDOCAINE VISCOUS 2 % MT SOLN
OROMUCOSAL | Status: DC | PRN
  Administered 2014-05-11: 1 via OROMUCOSAL

## 2014-05-11 MED ORDER — ONDANSETRON HCL 4 MG/2ML IJ SOLN
INTRAMUSCULAR | Status: AC
  Filled 2014-05-11: qty 2

## 2014-05-11 MED ORDER — LIDOCAINE VISCOUS 2 % MT SOLN
OROMUCOSAL | Status: AC
  Filled 2014-05-11: qty 15

## 2014-05-11 MED ORDER — MEPERIDINE HCL 100 MG/ML IJ SOLN
INTRAMUSCULAR | Status: AC
  Filled 2014-05-11: qty 2

## 2014-05-11 MED ORDER — MEPERIDINE HCL 100 MG/ML IJ SOLN
INTRAMUSCULAR | Status: DC | PRN
  Administered 2014-05-11: 25 mg

## 2014-05-11 MED ORDER — ONDANSETRON HCL 4 MG/2ML IJ SOLN
INTRAMUSCULAR | Status: DC | PRN
  Administered 2014-05-11: 4 mg via INTRAVENOUS

## 2014-05-11 NOTE — H&P (View-Only) (Signed)
Primary Care Physician:  Asencion Noble, MD Primary Gastroenterologist:  Dr. Gala Romney Pre-Procedure History & Physical: HPI:  [name] is a 74 y.o. female here for for evaluation of recurrent esophageal dysphagia. Patient has long-standing GERD. In 2005, she underwent an EGD by me for dysphagia. She was found to have a Schatzki's ring which was dilated. This was associated with, essentially, resolution of dysphagia symptoms until about 6 or 7 years ago.  She has developed insidiously recurrent ntermittent episodes of dysphagia to solids and pills. Over the past 6 months, dysphagia has gotten much worse. Reflux symptoms continue to be an issue; she has breakthrough symptoms 2-3 times weekly. Patient reports Dr. Willey Blade switched her from omeprazole to pantoprazole 40 mg daily last year with equivocal improvement in symptoms. She has not had any melena or hematochezia. 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 She denies abdominal pain. She has dentures which fit fairly well. She reports being able to chew her food fairly well.  She reports being started on chronic hemodialysis since I last saw her in the office.  Past Medical History  Diagnosis Date  . Essential hypertension, benign   . Hypercholesterolemia   . GERD (gastroesophageal reflux disease)   . Asthma   . Osteoporosis   . ESRD (end stage renal disease) on dialysis   . Anemia of chronic disease   . Peripheral neuropathy   . Schizoaffective disorder   . Arthritis   . History of pneumonia     Past Surgical History  Procedure Laterality Date  . Tubal ligation    . Breast biopsy    . Refractive surgery      Right eye  . Bone marrow aspiration    . Bone marrow biopsy    . Insertion of dialysis catheter  01/23/2011    Procedure: INSERTION OF DIALYSIS CATHETER;  Surgeon: Angelia Mould, MD;  Location: [facility];  Service: Vascular;  Laterality: N/A;  Insertion of Dialysis Catheter- Right Internal Jugular  . Yag  laser application  20/94/7096    Procedure: YAG LASER APPLICATION;  Surgeon: Elta Guadeloupe T. Gershon Crane, MD;  Location: [facility];  Service: Ophthalmology;  Laterality: Left;  . Esophagogastroduodenoscopy  05/06/2003    Dr.Rourk- schatski's ring o/w normal esophagus, stomach and first and second portions of the duodenum s/p dilation  . Colonoscopy  03/10/2010    Dr.Rourk- pigmentation of the rectum and coln consistent with melanosis coli, o/w unremarkable appearing colonic and rectal mucosa.    Prior to Admission medications   Medication Sig Start Date End Date Taking? Authorizing Provider  ALPRAZolam Duanne Moron) 0.5 MG tablet Take 1 tablet (0.5 mg total) by mouth 3 (three) times daily. 03/11/14 03/11/15 Yes Cloria Spring, MD  amiodarone (PACERONE) 200 MG tablet TAKE (1) TABLET BY MOUTH ONCE DAILY. 03/11/14  Yes Satira Sark, MD  amitriptyline (ELAVIL) 25 MG tablet Take 1 tablet (25 mg total) by mouth at bedtime. 01/19/14  Yes Cloria Spring, MD  amLODipine (NORVASC) 10 MG tablet Take 1 tablet (10 mg total) by mouth daily. 01/24/11  Yes Asencion Noble, MD  atorvastatin (LIPITOR) 40 MG tablet Take 1 tablet by mouth daily. 03/06/13  Yes Historical Provider, MD  diclofenac sodium (VOLTAREN) 1 % GEL Apply 2 g topically 4 (four) times daily.   Yes Historical Provider, MD  donepezil (ARICEPT) 5 MG tablet Take 5 mg by mouth at bedtime.  03/21/11  Yes Historical Provider, MD  gabapentin (NEURONTIN) 100 MG capsule Take 100 mg  by mouth daily.   Yes Historical Provider, MD  ipratropium (ATROVENT) 0.02 % nebulizer solution Take 500 mcg by nebulization 4 (four) times daily as needed. Shortness of Breath 11/16/11  Yes Rolland Porter, MD  labetalol (NORMODYNE) 100 MG tablet Take 100 mg by mouth 2 (two) times daily.    Yes Historical Provider, MD  methocarbamol (ROBAXIN) 500 MG tablet TAKE (1) TABLET BY MOUTH FOUR TIMES DAILY. 07/10/12  Yes Darrol Jump, MD  omeprazole (PRILOSEC) 20 MG capsule Take 20 mg by mouth daily.   Yes Historical  Provider, MD  oxyCODONE (OXY IR/ROXICODONE) 5 MG immediate release tablet Take 5 mg by mouth every 6 (six) hours as needed for moderate pain. pain 08/21/11  Yes Historical Provider, MD  risperiDONE (RISPERDAL) 2 MG tablet Take 1 tablet (2 mg total) by mouth at bedtime. 05/04/14 05/04/15 Yes Cloria Spring, MD    Allergies as of 05/04/2014 - Review Complete 05/04/2014  Allergen Reaction Noted  . Neurontin [gabapentin] Other (See Comments) 05/15/2012  . Ativan [lorazepam] Nausea Only and Other (See Comments) 12/24/2011  . Ferrous gluconate Nausea Only 06/27/2010    Family History  Problem Relation Age of Onset  . Alcohol abuse Brother   . Bipolar disorder Brother   . Dementia Mother   . Depression Mother   . ADD / ADHD Neg Hx   . Anxiety disorder Neg Hx   . Drug abuse Neg Hx   . OCD Neg Hx   . Paranoid behavior Neg Hx   . Schizophrenia Neg Hx   . Seizures Neg Hx   . Physical abuse Neg Hx   . Sexual abuse Neg Hx   . Healthy Daughter   . Healthy Daughter   . Healthy Daughter     History   Social History  . Marital Status: Married    Spouse Name: N/A  . Number of Children: N/A  . Years of Education: N/A   Occupational History  . Not on file.   Social History Main Topics  . Smoking status: Former Smoker    Types: Cigarettes  . Smokeless tobacco: Former Systems developer    Quit date: 10/22/2001  . Alcohol Use: No  . Drug Use: No  . Sexual Activity: Not on file   Other Topics Concern  . Not on file   Social History Narrative    Review of Systems: See HPI, otherwise negative ROS  Physical Exam: BP 149/67 mmHg  Pulse 90  Temp(Src) 98.1 F (36.7 C) (Oral)  Ht _0  (1.575 m)  Wt 215 lb (97.523 kg)  BMI 39.31 kg/m2 General:   Alert,  Chronically ill appearing, pleasant and cooperative in NAD. Has bilateral knee braces in place. She is a wheelchair. Accompanied by her daughter, [name]. Skin:  Intact without significant lesions or rashes. Eyes:  Sclera clear, no icterus.    Conjunctiva pink. Ears:  Normal auditory acuity. Nose:  No deformity, discharge,  or lesions. Mouth:  No deformity or lesions. Neck:  Supple; no masses or thyromegaly. No significant cervical adenopathy. Lungs:  Clear throughout to auscultation.   No wheezes, crackles, or rhonchi. No acute distress. Heart:  Regular rate and rhythm; 2/6 systolic ejection murmur., clicks, rubs,  or gallops. Abdomen: obese. normal bowel sounds.  Soft and nontender without appreciable mass or hepatosplenomegaly.  Pulses:  Normal pulses noted. Extremities:  Without clubbing or edema.  Impression:   Pleasant 74 year old lady with multiple comorbidities now with recurrent esophageal dysphagia in a setting of a known  Schatzki's ring. She did enjoyed long-term improvement with esophageal dilation previously. GERD symptoms may not be under optimal control this time. I do not detect any other alarm symptoms. I suspect we are dealing with a benign process.    Recommendations:    Schedule EGD with esophageal Dilation (recurrent dysphagia / history of schatzk's ring).  The risks, benefits, limitations, alternatives and imponderables have been reviewed with the patient. Potential for esophageal dilation, biopsy, etc. have also been reviewed.  Questions have been answered. All parties agreeable.  GERD information provided  Continue Protonix 40 mg daily  Further recommendations to follow        Notice: This dictation was prepared with Dragon dictation along with smaller phrase technology. Any transcriptional errors that result from this process are unintentional and may not be corrected upon review.

## 2014-05-11 NOTE — Op Note (Signed)
[facility] [address] [place], [id number]   ENDOSCOPY PROCEDURE REPORT  PATIENT: [name], [name]  MR#: [id number] BIRTHDATE: 04-12-1940 , 73  yrs. old GENDER: female ENDOSCOPIST: R.  Roetta SessionsMichael Maleiya Pergola, MD FACP FACG REFERRED BY:  Carylon Perchesoy Fagan, [name].D. PROCEDURE DATE:  05/11/2014 PROCEDURE:  EGD, diagnostic, EGD w/ biopsy , and Maloney dilation of esophagus INDICATIONS:  Recurrent esophageal dysphagia; history of Schatzki's ring. MEDICATIONS: Versed 4 mg IV and Demerol 25 mg IV in divided doses. Xylocaine gel orally.  Zofran 4 mg IV. ASA CLASS:      Class III  CONSENT: The risks, benefits, limitations, alternatives and imponderables have been discussed.  The potential for biopsy, esophogeal dilation, etc. have also been reviewed.  Questions have been answered.  All parties agreeable.  Please see the history and physical in the medical record for more information.  DESCRIPTION OF PROCEDURE: After the risks benefits and alternatives of the procedure were thoroughly explained, informed consent was obtained.  The EG-[id number] ([id number] endoscope was introduced through the mouth and advanced to the second portion of the duodenum , limited by Without limitations. The instrument was slowly withdrawn as the mucosa was fully examined.    Noncritical.  Schatzki's ring; otherwise,normal appearing esophagus. Stomach empty.  3 cm hernia.  Some subtle fish or snakeskin appearance of the gastric mucosa.  No varices seen.  No ulcer infiltrating process.  Patent pylorus.  Normal first and second portion of duodenum.  Scope was withdrawn and a 56 [place]French Maloney dilator insertion easily.  A look back revealed no apparent complication related to this maneuver.  Finally, biopsies of the abnormal gastric mucosa taken.  Retroflexed views revealed as previously described.     The scope was then withdrawn from the patient and the procedure completed.  COMPLICATIONS: There were no immediate  complications.  ENDOSCOPIC IMPRESSION: Schatzki's ring?"status post dilation as described above. Hiatal hernia. Abnormal gastric mucosa was uncertain significance?"status post gastric biopsy  RECOMMENDATIONS: Continue Protonix 40 mg daily. Follow up on pathology.  REPEAT EXAM:  eSigned:  R. Roetta SessionsMichael Cloma Rahrig, MD Jerrel IvoryFACP [facility] CenterFACG 05/11/2014 4:23 PM    CC:  CPT CODES: ICD CODES:  The ICD and CPT codes recommended by this software are interpretations from the data that the clinical staff has captured with the software.  The verification of the translation of this report to the ICD and CPT codes and modifiers is the sole responsibility of the health care institution and practicing physician where this report was generated.  [employer] will not be held responsible for the validity of the ICD and CPT codes included on this report.  AMA assumes no liability for data contained or not contained herein. CPT is a Publishing rights managerregistered trademark of the [employer].

## 2014-05-11 NOTE — Discharge Instructions (Signed)
EGD Discharge instructions Please read the instructions outlined below and refer to this sheet in the next few weeks. These discharge instructions provide you with general information on caring for yourself after you leave the hospital. Your doctor may also give you specific instructions. While your treatment has been planned according to the most current medical practices available, unavoidable complications occasionally occur. If you have any problems or questions after discharge, please call your doctor. ACTIVITY  You may resume your regular activity but move at a slower pace for the next 24 hours.   Take frequent rest periods for the next 24 hours.   Walking will help expel (get rid of) the air and reduce the bloated feeling in your abdomen.   No driving for 24 hours (because of the anesthesia (medicine) used during the test).   You may shower.   Do not sign any important legal documents or operate any machinery for 24 hours (because of the anesthesia used during the test).  NUTRITION  Drink plenty of fluids.   You may resume your normal diet.   Begin with a light meal and progress to your normal diet.   Avoid alcoholic beverages for 24 hours or as instructed by your caregiver.  MEDICATIONS  You may resume your normal medications unless your caregiver tells you otherwise.  WHAT YOU CAN EXPECT TODAY  You may experience abdominal discomfort such as a feeling of fullness or gas pains.  FOLLOW-UP  Your doctor will discuss the results of your test with you.  SEEK IMMEDIATE MEDICAL ATTENTION IF ANY OF THE FOLLOWING OCCUR:  Excessive nausea (feeling sick to your stomach) and/or vomiting.   Severe abdominal pain and distention (swelling).   Trouble swallowing.   Temperature over 101 F (37.8 C).   Rectal bleeding or vomiting of blood.    GERD information provided  Continue Protonix 40 mg daily  Further recommendations to follow pending review of pathology  report Gastroesophageal Reflux Disease, Adult Gastroesophageal reflux disease (GERD) happens when acid from your stomach flows up into the esophagus. When acid comes in contact with the esophagus, the acid causes soreness (inflammation) in the esophagus. Over time, GERD may create small holes (ulcers) in the lining of the esophagus. CAUSES   Increased body weight. This puts pressure on the stomach, making acid rise from the stomach into the esophagus.  Smoking. This increases acid production in the stomach.  Drinking alcohol. This causes decreased pressure in the lower esophageal sphincter (valve or ring of muscle between the esophagus and stomach), allowing acid from the stomach into the esophagus.  Late evening meals and a full stomach. This increases pressure and acid production in the stomach.  A malformed lower esophageal sphincter. Sometimes, no cause is found. SYMPTOMS   Burning pain in the lower part of the mid-chest behind the breastbone and in the mid-stomach area. This may occur twice a week or more often.  Trouble swallowing.  Sore throat.  Dry cough.  Asthma-like symptoms including chest tightness, shortness of breath, or wheezing. DIAGNOSIS  Your caregiver may be able to diagnose GERD based on your symptoms. In some cases, X-rays and other tests may be done to check for complications or to check the condition of your stomach and esophagus. TREATMENT  Your caregiver may recommend over-the-counter or prescription medicines to help decrease acid production. Ask your caregiver before starting or adding any new medicines.  HOME CARE INSTRUCTIONS   Change the factors that you can control. Ask your caregiver for guidance  concerning weight loss, quitting smoking, and alcohol consumption.  Avoid foods and drinks that make your symptoms worse, such as:  Caffeine or alcoholic drinks.  Chocolate.  Peppermint or mint flavorings.  Garlic and onions.  Spicy foods.  Citrus  fruits, such as oranges, lemons, or limes.  Tomato-based foods such as sauce, chili, salsa, and pizza.  Fried and fatty foods.  Avoid lying down for the 3 hours prior to your bedtime or prior to taking a nap.  Eat small, frequent meals instead of large meals.  Wear loose-fitting clothing. Do not wear anything tight around your waist that causes pressure on your stomach.  Raise the head of your bed 6 to 8 inches with wood blocks to help you sleep. Extra pillows will not help.  Only take over-the-counter or prescription medicines for pain, discomfort, or fever as directed by your caregiver.  Do not take aspirin, ibuprofen, or other nonsteroidal anti-inflammatory drugs (NSAIDs). SEEK IMMEDIATE MEDICAL CARE IF:   You have pain in your arms, neck, jaw, teeth, or back.  Your pain increases or changes in intensity or duration.  You develop nausea, vomiting, or sweating (diaphoresis).  You develop shortness of breath, or you faint.  Your vomit is green, yellow, black, or looks like coffee grounds or blood.  Your stool is red, bloody, or black. These symptoms could be signs of other problems, such as heart disease, gastric bleeding, or esophageal bleeding. MAKE SURE YOU:   Understand these instructions.  Will watch your condition.  Will get help right away if you are not doing well or get worse. Document Released: 10/11/2004 Document Revised: 03/26/2011 Document Reviewed: 07/21/2010 [facility] Patient Information 2015 [place], [place]. This information is not intended to replace advice given to you by your health care provider. Make sure you discuss any questions you have with your health care provider. Esophageal Dilatation The esophagus is the long, narrow tube which carries food and liquid from the mouth to the stomach. Esophageal dilatation is the technique used to stretch a blocked or narrowed portion of the esophagus. This procedure is used when a part of the esophagus has become  so narrow that it becomes difficult, painful or even impossible to swallow. This is generally an uncomplicated form of treatment. When this is not successful, chest surgery may be required. This is a much more extensive form of treatment with a longer recovery time. CAUSES  Some of the more common causes of blockage or strictures of the esophagus are:  Narrowing from longstanding inflammation (soreness and redness) of the lower esophagus. This comes from the constant exposure of the lower esophagus to the acid which bubbles up from the stomach. Over time this causes scarring and narrowing of the lower esophagus.  Hiatal hernia in which a small part of the stomach bulges (herniates) up through the diaphragm. This can cause a gradual narrowing of the end of the esophagus.  Schatzki ring is a narrow ring of benign (non-cancerous) fibrous tissue which constricts the lower esophagus. The reason for this is not known.  Scleroderma is a connective tissue disorder that affects the esophagus and makes swallowing difficult.  Achalasia is an absence of nerves to the lower esophagus and to the esophageal sphincter. This is the circular muscle between the stomach and esophagus that relaxes to allow food into the stomach. After swallowing, it contracts to keep food in the stomach. This absence of nerves may be congenital (present since birth). This can cause irregular spasms of the lower esophageal muscle.  This spasm does not open up to allow food and fluid through. The result is a persistent blockage with subsequent slow trickling of the esophageal contents into the stomach.  Strictures may develop from swallowing materials which damage the esophagus. Some examples are strong acids or alkalis such as lye.  Growths such as benign (non-cancerous) and malignant (cancerous) tumors can block the esophagus.  Hereditary (present since birth) causes. DIAGNOSIS  Your caregiver often suspects this problem by taking a  medical history. They will also do a physical exam. They can then prove their suspicions using X-rays and endoscopy. Endoscopy is an exam in which a tube like a small, flexible telescope is used to look at your esophagus.  TREATMENT There are different stretching (dilating) techniques that can be used. Simple bougie dilatation may be done in the office. This usually takes only a couple minutes. A numbing (anesthetic) spray of the throat is used. Endoscopy, when done, is done in an endoscopy suite under mild sedation. When fluoroscopy is used, the procedure is performed in X-ray. Other techniques require a little longer time. Recovery is usually quick. There is no waiting time to begin eating and drinking to test success of the treatment. Following are some of the methods used. Narrowing of the esophagus is treated by making it bigger. Commonly this is a mechanical problem which can be treated with stretching. This can be done in different ways. Your caregiver will discuss these with you. Some of the means used are:  A series of graduated (increasing thickness) flexible dilators can be used. These are weighted tubes passed through the esophagus into the stomach. The tubes used become progressively larger until the desired stretched size is reached. Graduated dilators are a simple and quick way of opening the esophagus. No visualization is required.  Another method is the use of endoscopy to place a flexible wire across the stricture. The endoscope is removed and the wire left in place. A dilator with a hole through it from end to end is guided down the esophagus and across the stricture. One or more of these dilators are passed over the wire. At the end of the exam, the wire is removed. This type of treatment may be performed in the X-ray department under fluoroscopy. An advantage of this procedure is the examiner is visualizing the end opening in the esophagus.  Stretching of the esophagus may be done  using balloons. Deflated balloons are placed through the endoscope and across the stricture. This type of balloon dilatation is often done at the time of endoscopy or fluoroscopy. Flexible endoscopy allows the examiner to directly view the stricture. A balloon is inserted in the deflated form into the area of narrowing. It is then inflated with air to a certain pressure that is preset for a given circumference. When inflated, it becomes sausage shaped, stretched, and makes the stricture larger.  Achalasia requires a longer, larger balloon-type dilator. This is frequently done under X-ray control. In this situation, the spastic muscle fibers in the lower esophagus are stretched. All of the above procedures make the passage of food and water into the stomach easier. They also make it easier for stomach contents to reflux back into the esophagus. Special medications may be used following the procedure to help prevent further stricturing. Proton-pump inhibitor medications are good at decreasing the amount of acid in the stomach juice. When stomach juice refluxes into the esophagus, the juice is no longer as acidic and is less likely to burn or  scar the esophagus. RISKS AND COMPLICATIONS Esophageal dilatation is usually performed effectively and without problems. Some complications that can occur are:  A small amount of bleeding almost always happens where the stretching takes place. If this is too excessive it may require more aggressive treatment.  An uncommon complication is perforation (making a hole) of the esophagus. The esophagus is thin. It is easy to make a hole in it. If this happens, an operation may be necessary to repair this.  A small, undetected perforation could lead to an infection in the chest. This can be very serious. HOME CARE INSTRUCTIONS   If you received sedation for your procedure, do not drive, make important decisions, or perform any activities requiring your full coordination. Do  not drink alcohol, take sedatives, or use any mind altering chemicals unless instructed by your caregiver.  You may use throat lozenges or warm salt water gargles if you have throat discomfort.  You can begin eating and drinking normally on return home unless instructed otherwise. Do not purposely try to force large chunks of food down to test the benefits of your procedure.  Mild discomfort can be eased with sips of ice water.  Medications for discomfort may or may not be needed. SEEK IMMEDIATE MEDICAL CARE IF:   You begin vomiting up blood.  You develop black, tarry stools.  You develop chills or an unexplained temperature of over 101F (38.3C)  You develop chest or abdominal pain.  You develop shortness of breath, or feel light-headed or faint.  Your swallowing is becoming more painful, difficult, or you are unable to swallow. MAKE SURE YOU:   Understand these instructions.  Will watch your condition.  Will get help right away if you are not doing well or get worse. Document Released: 02/22/2005 Document Revised: 05/18/2013 Document Reviewed: 04/11/2005 [facility] Patient Information 2015 [place], [place]. This information is not intended to replace advice given to you by your health care provider. Make sure you discuss any questions you have with your health care provider.

## 2014-05-11 NOTE — Interval H&P Note (Signed)
History and Physical Interval Note:  05/11/2014 3:56 PM  [name]  has presented today for surgery, with the diagnosis of dysphagia, schatzkis ring  The various methods of treatment have been discussed with the patient and family. After consideration of risks, benefits and other options for treatment, the patient has consented to  Procedure(s) with comments: ESOPHAGOGASTRODUODENOSCOPY (EGD) (N/A) - 245 ESOPHAGEAL DILATION (N/A) as a surgical intervention .  The patient's history has been reviewed, patient examined, no change in status, stable for surgery.  I have reviewed the patient's chart and labs.  Questions were answered to the patient's satisfaction.     [name]  No change. EGD with esophageal dilation as appropriate.The risks, benefits, limitations, alternatives and imponderables have been reviewed with the patient. Potential for esophageal dilation, biopsy, etc. have also been reviewed.  Questions have been answered. All parties agreeable.

## 2014-05-12 ENCOUNTER — Encounter (HOSPITAL_COMMUNITY): Payer: Self-pay | Admitting: Internal Medicine

## 2014-05-13 ENCOUNTER — Encounter: Payer: Self-pay | Admitting: Internal Medicine

## 2014-05-20 ENCOUNTER — Ambulatory Visit (INDEPENDENT_AMBULATORY_CARE_PROVIDER_SITE_OTHER): Payer: Medicare Other | Admitting: Psychiatry

## 2014-05-20 ENCOUNTER — Encounter (HOSPITAL_COMMUNITY): Payer: Self-pay | Admitting: Psychiatry

## 2014-05-20 VITALS — BP 120/78

## 2014-05-20 DIAGNOSIS — F329 Major depressive disorder, single episode, unspecified: Secondary | ICD-10-CM

## 2014-05-20 DIAGNOSIS — F419 Anxiety disorder, unspecified: Secondary | ICD-10-CM

## 2014-05-20 DIAGNOSIS — F259 Schizoaffective disorder, unspecified: Secondary | ICD-10-CM | POA: Diagnosis not present

## 2014-05-20 DIAGNOSIS — F250 Schizoaffective disorder, bipolar type: Secondary | ICD-10-CM

## 2014-05-20 MED ORDER — RISPERIDONE 2 MG PO TABS: 2.0000 mg | ORAL_TABLET | Freq: Every day | ORAL | Status: DC

## 2014-05-20 MED ORDER — AMITRIPTYLINE HCL 25 MG PO TABS: 25.0000 mg | ORAL_TABLET | Freq: Every day | ORAL | Status: DC

## 2014-05-20 MED ORDER — ALPRAZOLAM 0.5 MG PO TABS: 0.5000 mg | ORAL_TABLET | Freq: Three times a day (TID) | ORAL | Status: DC
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
## 2014-06-01 ENCOUNTER — Other Ambulatory Visit (HOSPITAL_COMMUNITY): Payer: Self-pay | Admitting: Respiratory Therapy

## 2014-06-01 DIAGNOSIS — G40909 Epilepsy, unspecified, not intractable, without status epilepticus: Secondary | ICD-10-CM

## 2014-06-28 ENCOUNTER — Other Ambulatory Visit (HOSPITAL_COMMUNITY): Payer: Self-pay | Admitting: Psychiatry

## 2014-07-27 ENCOUNTER — Ambulatory Visit (HOSPITAL_COMMUNITY): Payer: Self-pay

## 2014-08-04 ENCOUNTER — Other Ambulatory Visit (HOSPITAL_COMMUNITY): Payer: Self-pay

## 2014-08-04 ENCOUNTER — Ambulatory Visit (HOSPITAL_COMMUNITY): Payer: Self-pay | Admitting: Oncology

## 2014-08-04 ENCOUNTER — Ambulatory Visit (HOSPITAL_COMMUNITY): Payer: Self-pay

## 2014-08-10 ENCOUNTER — Ambulatory Visit (HOSPITAL_COMMUNITY): Payer: Self-pay

## 2014-08-17 ENCOUNTER — Encounter (HOSPITAL_BASED_OUTPATIENT_CLINIC_OR_DEPARTMENT_OTHER): Payer: Medicare Other

## 2014-08-17 ENCOUNTER — Encounter (HOSPITAL_COMMUNITY): Payer: Self-pay | Admitting: Oncology

## 2014-08-17 ENCOUNTER — Encounter (HOSPITAL_COMMUNITY): Payer: Medicare Other

## 2014-08-17 ENCOUNTER — Encounter (HOSPITAL_COMMUNITY): Payer: Medicare Other | Attending: Oncology | Admitting: Oncology

## 2014-08-17 VITALS — BP 178/92 | HR 53 | Temp 98.0°F | Resp 18 | Wt 215.0 lb

## 2014-08-17 DIAGNOSIS — D631 Anemia in chronic kidney disease: Secondary | ICD-10-CM

## 2014-08-17 DIAGNOSIS — Z992 Dependence on renal dialysis: Secondary | ICD-10-CM

## 2014-08-17 DIAGNOSIS — N184 Chronic kidney disease, stage 4 (severe): Secondary | ICD-10-CM | POA: Insufficient documentation

## 2014-08-17 DIAGNOSIS — N186 End stage renal disease: Secondary | ICD-10-CM | POA: Diagnosis not present

## 2014-08-17 DIAGNOSIS — M810 Age-related osteoporosis without current pathological fracture: Secondary | ICD-10-CM | POA: Insufficient documentation

## 2014-08-17 DIAGNOSIS — N189 Chronic kidney disease, unspecified: Secondary | ICD-10-CM

## 2014-08-17 LAB — COMPREHENSIVE METABOLIC PANEL
ALBUMIN: 3.2 g/dL — AB (ref 3.5–5.0)
ALT: 13 U/L — AB (ref 14–54)
AST: 18 U/L (ref 15–41)
Alkaline Phosphatase: 73 U/L (ref 38–126)
Anion gap: 10 (ref 5–15)
BUN: 13 mg/dL (ref 6–20)
CO2: 28 mmol/L (ref 22–32)
Calcium: 9 mg/dL (ref 8.9–10.3)
Chloride: 93 mmol/L — ABNORMAL LOW (ref 101–111)
Creatinine, Ser: 5.78 mg/dL — ABNORMAL HIGH (ref 0.44–1.00)
GFR calc Af Amer: 8 mL/min — ABNORMAL LOW (ref >60)
GFR calc non Af Amer: 7 mL/min — ABNORMAL LOW (ref >60)
Glucose, Bld: 107 mg/dL — ABNORMAL HIGH (ref 65–99)
POTASSIUM: 3.5 mmol/L (ref 3.5–5.1)
Sodium: 131 mmol/L — ABNORMAL LOW (ref 135–145)
TOTAL PROTEIN: 6.6 g/dL (ref 6.5–8.1)
Total Bilirubin: 0.8 mg/dL (ref 0.3–1.2)

## 2014-08-17 MED ORDER — DENOSUMAB 60 MG/ML ~~LOC~~ SOLN
60.0000 mg | Freq: Once | SUBCUTANEOUS | Status: AC
  Administered 2014-08-17: 60 mg via SUBCUTANEOUS
  Filled 2014-08-17: qty 1

## 2014-08-17 NOTE — Progress Notes (Signed)
Please see doctors encounter for more information 

## 2014-08-17 NOTE — Patient Instructions (Signed)
[facility] at [facility] Discharge Instructions  RECOMMENDATIONS MADE BY THE CONSULTANT AND ANY TEST RESULTS WILL BE SENT TO YOUR REFERRING PHYSICIAN.  Prolia injection today.  Prolia injections in the future with Dr. Alonza Smoker office. No further follow-up at this clinic is required.  Thank you for choosing [facility] at [facility] to provide your oncology and hematology care.  To afford each patient quality time with our provider, please arrive at least 15 minutes before your scheduled appointment time.    You need to re-schedule your appointment should you arrive 10 or more minutes late.  We strive to give you quality time with our providers, and arriving late affects you and other patients whose appointments are after yours.  Also, if you no show three or more times for appointments you may be dismissed from the clinic at the providers discretion.     Again, thank you for choosing [facility].  Our hope is that these requests will decrease the amount of time that you wait before being seen by our physicians.       _____________________________________________________________  Should you have questions after your visit to [facility], please contact our office at [phone] between the hours of 8:30 a.m. and 4:30 p.m.  Voicemails left after 4:30 p.m. will not be returned until the following business day.  For prescription refill requests, have your pharmacy contact our office.

## 2014-08-17 NOTE — Progress Notes (Signed)
[name] presents today for injection per the provider's orders.  Prolia administration without incident; see MAR for injection details.  Patient tolerated procedure well and without incident.  No questions or complaints noted at this time. 

## 2014-08-17 NOTE — Progress Notes (Signed)
[facility] Hematology/Oncology Consultation   Name: [name]      MRN: [id number]    Location: Room/bed info not found  Date: 08/17/2014 Time:3:22 PM   REFERRING PHYSICIAN:  Self  REASON FOR CONSULT:  Re-establishment of care   DIAGNOSIS:  Anemia of chronic renal disease and osteoporosis.  HISTORY OF PRESENT ILLNESS:   [name] is a 74 year old black American female who is well-known to the [facility] where she was previously followed for anemia of chronic renal disease having been treated with ESA therapy since March 2002 (according to our paper chart records) up until July 2015 and Prolia for osteoporosis (having switched from Pamidronate June 2003- July 2008).  She is here today to re-establish her care after having been seen here in the clinic last on 05/31/2010.  Chart is reviewed.  Since her 05/31/2010 encounter in the clinic, she is now on dialysis and therefore receiving ESA treatment by her nephrologist and management of her Hgb.  The patient's daughter reports that her Hgb has been stable in the 11 g/dL range.  We will not participate that this time in the patient's anemia management as this is being done by nephrology as previously mentioned.  She has continued with Prolia given by our clinic despite not being seen by a provider for osteoporosis.    From a hematology standpoint, there is nothing that we are actively treating or observing.  I have discussed this with Dr. Willey Blade and he has agreed to manage the patient's osteoporosis which is more appropriate for the patient.         PAST MEDICAL HISTORY:   Past Medical History  Diagnosis Date  . Essential hypertension, benign   . Hypercholesterolemia   . GERD (gastroesophageal reflux disease)   . Asthma   . Osteoporosis   . ESRD (end stage renal disease) on dialysis   . Anemia of chronic disease   . Peripheral neuropathy   . Schizoaffective disorder   . Arthritis   . History of pneumonia      ALLERGIES: Allergies  Allergen Reactions  . Neurontin [Gabapentin] Other (See Comments)    Had about every known side effect with $RemoveBef'300mg'[id number]$  three a day on this. DO NOT TRY THIS AGAIN!  . Ativan [Lorazepam] Nausea Only and Other (See Comments)    Disoriented, not feel like herself   . Ferrous Gluconate Nausea Only    hypotension      MEDICATIONS: I have reviewed the patient's current medications.    Current Outpatient Prescriptions on File Prior to Visit  Medication Sig Dispense Refill  . ALPRAZolam (XANAX) 0.5 MG tablet Take 1 tablet (0.5 mg total) by mouth 3 (three) times daily. 90 tablet 3  . amiodarone (PACERONE) 200 MG tablet TAKE (1) TABLET BY MOUTH ONCE DAILY. 30 tablet 6  . amitriptyline (ELAVIL) 25 MG tablet Take 1 tablet (25 mg total) by mouth at bedtime. 30 tablet 3  . amLODipine (NORVASC) 10 MG tablet Take 1 tablet (10 mg total) by mouth daily. 30 tablet 12  . atorvastatin (LIPITOR) 40 MG tablet Take 1 tablet by mouth daily.    . diclofenac sodium (VOLTAREN) 1 % GEL Apply 2 g topically 4 (four) times daily.    Marland Kitchen donepezil (ARICEPT) 5 MG tablet Take 5 mg by mouth at bedtime.     . gabapentin (NEURONTIN) 100 MG capsule Take 100 mg by mouth daily.    Marland Kitchen ibuprofen (ADVIL,MOTRIN) 200  MG tablet Take 400 mg by mouth daily as needed for moderate pain.    Marland Kitchen ipratropium (ATROVENT) 0.02 % nebulizer solution Take 500 mcg by nebulization 4 (four) times daily as needed. Shortness of Breath    . labetalol (NORMODYNE) 100 MG tablet Take 100 mg by mouth 2 (two) times daily.     . methocarbamol (ROBAXIN) 500 MG tablet TAKE (1) TABLET BY MOUTH FOUR TIMES DAILY. 120 tablet 3  . omeprazole (PRILOSEC) 20 MG capsule Take 20 mg by mouth daily.    . Oxycodone HCl 10 MG TABS Take 1 tablet by mouth 4 (four) times daily as needed.    . risperiDONE (RISPERDAL) 2 MG tablet Take 1 tablet (2 mg total) by mouth at bedtime. 30 tablet 3  . tiZANidine (ZANAFLEX) 2 MG tablet Take 1 tablet by mouth 2 (two) times  daily as needed for muscle spasms.      No current facility-administered medications on file prior to visit.     PAST SURGICAL HISTORY Past Surgical History  Procedure Laterality Date  . Tubal ligation    . Breast biopsy    . Refractive surgery      Right eye  . Bone marrow aspiration    . Bone marrow biopsy    . Insertion of dialysis catheter  01/23/2011    Procedure: INSERTION OF DIALYSIS CATHETER;  Surgeon: Angelia Mould, MD;  Location: [place];  Service: Vascular;  Laterality: N/A;  Insertion of Dialysis Catheter- Right Internal Jugular  . Yag laser application  71/16/5790    Procedure: YAG LASER APPLICATION;  Surgeon: Elta Guadeloupe T. Gershon Crane, MD;  [facility]: [facility];  Service: Ophthalmology;  Laterality: Left;  . Esophagogastroduodenoscopy  05/06/2003    Dr.Rourk- schatski's ring o/w normal esophagus, stomach and first and second portions of the duodenum s/p dilation  . Colonoscopy  03/10/2010    Dr.Rourk- pigmentation of the rectum and coln consistent with melanosis coli, o/w unremarkable appearing colonic and rectal mucosa.  . Esophagogastroduodenoscopy N/A 05/11/2014    Procedure: ESOPHAGOGASTRODUODENOSCOPY (EGD);  Surgeon: Daneil Dolin, MD;  [facility];  Service: Endoscopy;  Laterality: N/A;  245  . Esophageal dilation N/A 05/11/2014    Procedure: ESOPHAGEAL DILATION;  Surgeon: Daneil Dolin, MD;  [facility];  Service: Endoscopy;  Laterality: N/A;    FAMILY HISTORY: Family History  Problem Relation Age of Onset  . Alcohol abuse Brother   . Bipolar disorder Brother   . Dementia Mother   . Depression Mother   . ADD / ADHD Neg Hx   . Anxiety disorder Neg Hx   . Drug abuse Neg Hx   . OCD Neg Hx   . Paranoid behavior Neg Hx   . Schizophrenia Neg Hx   . Seizures Neg Hx   . Physical abuse Neg Hx   . Sexual abuse Neg Hx   . Healthy Daughter   . Healthy Daughter   . Healthy Daughter     SOCIAL HISTORY:  reports that she quit smoking about 16  years ago. Her smoking use included Cigarettes. She has a 74 pack-year smoking history. She quit smokeless tobacco use about 12 years ago. She reports that she does not drink alcohol or use illicit drugs.  PERFORMANCE STATUS: The patient's performance status is 2 - Symptomatic, <50% confined to bed  PHYSICAL EXAM: Most Recent Vital Signs: Blood pressure 178/92, pulse 53, temperature 98 F (36.7 C), temperature source Oral, resp. rate 18, weight 215 lb (97.523  kg), SpO2 100 %. General appearance: alert, cooperative, appears stated age, no distress, moderately obese and in wheelchair, accompanied by daughter. Head: Normocephalic, without obvious abnormality, atraumatic Eyes: negative findings: lids and lashes normal, conjunctivae and sclerae normal and corneas clear Neck: no adenopathy and supple, symmetrical, trachea midline Lungs: clear to auscultation bilaterally Heart: regular rate and rhythm Abdomen: normal findings: soft, non-tender Extremities: extremities normal, atraumatic, no cyanosis or edema Skin: Skin color, texture, turgor normal. No rashes or lesions Neurologic: Grossly normal  LABORATORY DATA:  Results for orders placed or performed in visit on 08/17/14 (from the past 48 hour(s))  Comprehensive metabolic panel     Status: Abnormal   Collection Time: 08/17/14  2:00 PM  Result Value Ref Range   Sodium 131 (L) 135 - 145 mmol/L   Potassium 3.5 3.5 - 5.1 mmol/L   Chloride 93 (L) 101 - 111 mmol/L   CO2 28 22 - 32 mmol/L   Glucose, Bld 107 (H) 65 - 99 mg/dL   BUN 13 6 - 20 mg/dL   Creatinine, Ser 5.78 (H) 0.44 - 1.00 mg/dL   Calcium 9.0 8.9 - 10.3 mg/dL   Total Protein 6.6 6.5 - 8.1 g/dL   Albumin 3.2 (L) 3.5 - 5.0 g/dL   AST 18 15 - 41 U/L   ALT 13 (L) 14 - 54 U/L   Alkaline Phosphatase 73 38 - 126 U/L   Total Bilirubin 0.8 0.3 - 1.2 mg/dL   GFR calc non Af Amer 7 (L) >60 mL/min   GFR calc Af Amer 8 (L) >60 mL/min    Comment: (NOTE) The eGFR has been calculated using  the CKD EPI equation. This calculation has not been validated in all clinical situations. eGFR's persistently <60 mL/min signify possible Chronic Kidney Disease.    Anion gap 10 5 - 15      RADIOGRAPHY: No results found.     PATHOLOGY:  None   ASSESSMENT:  1. Anemia of end-stage renal disease, on hemodialysis on M-W-F, managed by nephrology. 2. Osteoporosis, previously on Pamironate and switched to Prolia years ago.  She has tolerated well.  Patient Active Problem List   Diagnosis Date Noted  . Hiatal hernia   . Mucosal abnormality of stomach   . Schatzki's ring   . Atrial tachycardia 10/22/2013  . Paroxysmal atrial tachycardia 08/12/2013  . Chest pain 03/30/2013  . COPD (chronic obstructive pulmonary disease) 03/30/2013  . ESRD on dialysis 03/30/2013  . Morbid obesity 03/30/2013  . Chronic back pain greater than 3 months duration 05/15/2012  . Restless leg syndrome 05/15/2012  . Insomnia secondary to depression with anxiety 04/17/2012  . COPD (chronic obstructive pulmonary disease) with acute bronchitis 01/16/2011  . Acute on chronic kidney failure 01/16/2011  . Hypertension   . Chronic kidney disease   . Schizoaffective disorder   . B12 deficiency 01/04/2011  . Anemia 11/21/2010  . Renal insufficiency 08/03/2010  . Peripheral neuropathy 08/03/2010  . Anemia of chronic disease 08/07/2007  . Anxiety and depression 08/07/2007  . HYPERTENSION 08/07/2007  . ASTHMA 08/07/2007  . GERD 08/07/2007  . ARTHRITIS 08/07/2007  . SPASM, MUSCLE 08/07/2007  . Osteoporosis 08/07/2007  . DYSPHAGIA UNSPECIFIED 08/07/2007  . SCHATZKI'S RING, HX OF 08/07/2007     PLAN:  1. I personally reviewed and went over laboratory results with the patient.  The results are noted within this dictation. 2. I personally reviewed and went over radiographic studies with the patient.  The results are noted within this  dictation.   3. Chart reviewed. 4. Labs today: CMET 5. Prolia injection  today. 6. Patient's case discussed with Dr. Willey Blade.  He has agreed to manage the patient's osteoporosis as management at the [facility] is not appropriate. 7. Prolia supportive therapy plan discontinued. 8. Release to [facility].  We would be glad to see her back if needed. Patient and family agreeable to this plan.  All questions were answered. The patient knows to call the clinic with any problems, questions or concerns. We can certainly see the patient much sooner if necessary.  Patient and plan discussed with Dr. Ancil Linsey and she is in agreement with the aforementioned.   This note is electronically signed by: Doy Mince 08/17/2014 3:22 PM

## 2014-08-17 NOTE — Progress Notes (Signed)
[name] presented for labwork. Labs per MD order drawn via Peripheral Line 23 gauge needle inserted in right AC.  Good blood return present. Procedure without incident.  Needle removed intact. Patient tolerated procedure well.

## 2014-08-24 ENCOUNTER — Ambulatory Visit (HOSPITAL_COMMUNITY)
Admission: RE | Admit: 2014-08-24 | Discharge: 2014-08-24 | Disposition: A | Discharge status: Home / Self Care | Payer: Medicare Other | Source: Ambulatory Visit | Attending: Neurology | Admitting: Neurology

## 2014-08-24 DIAGNOSIS — G40909 Epilepsy, unspecified, not intractable, without status epilepticus: Secondary | ICD-10-CM | POA: Diagnosis present

## 2014-08-24 DIAGNOSIS — Z79899 Other long term (current) drug therapy: Secondary | ICD-10-CM | POA: Diagnosis not present

## 2014-08-24 NOTE — Progress Notes (Signed)
EEG Completed; Results Pending  

## 2014-09-03 NOTE — Procedures (Signed)
  [facility] Teya Otterson A. Gerilyn Pilgrim, MD     [email or web address]           HISTORY: The patient is 74 year old presents with spells suspicious for seizure activity.  MEDICATIONS: Scheduled Meds: Continuous Infusions: PRN Meds:.  Prior to Admission medications   Medication Sig Start Date End Date Taking? Authorizing Provider  ALPRAZolam Prudy Feeler) 0.5 MG tablet Take 1 tablet (0.5 mg total) by mouth 3 (three) times daily. 05/20/14 05/20/15  Myrlene Broker, MD  amiodarone (PACERONE) 200 MG tablet TAKE (1) TABLET BY MOUTH ONCE DAILY. 03/11/14   Jonelle Sidle, MD  amitriptyline (ELAVIL) 25 MG tablet Take 1 tablet (25 mg total) by mouth at bedtime. 05/20/14   Myrlene Broker, MD  amLODipine (NORVASC) 10 MG tablet Take 1 tablet (10 mg total) by mouth daily. 01/24/11   Carylon Perches, MD  atorvastatin (LIPITOR) 40 MG tablet Take 1 tablet by mouth daily. 03/06/13   Historical Provider, MD  diclofenac sodium (VOLTAREN) 1 % GEL Apply 2 g topically 4 (four) times daily.    Historical Provider, MD  donepezil (ARICEPT) 5 MG tablet Take 5 mg by mouth at bedtime.  03/21/11   Historical Provider, MD  gabapentin (NEURONTIN) 100 MG capsule Take 100 mg by mouth daily.    Historical Provider, MD  ibuprofen (ADVIL,MOTRIN) 200 MG tablet Take 400 mg by mouth daily as needed for moderate pain.    Historical Provider, MD  ipratropium (ATROVENT) 0.02 % nebulizer solution Take 500 mcg by nebulization 4 (four) times daily as needed. Shortness of Breath 11/16/11   Devoria Albe, MD  labetalol (NORMODYNE) 100 MG tablet Take 100 mg by mouth 2 (two) times daily.     Historical Provider, MD  methocarbamol (ROBAXIN) 500 MG tablet TAKE (1) TABLET BY MOUTH FOUR TIMES DAILY. 07/10/12   Mike Craze, MD  omeprazole (PRILOSEC) 20 MG capsule Take 20 mg by mouth daily.    Historical Provider, MD  Oxycodone HCl 10 MG TABS Take 1 tablet by mouth 4 (four) times daily as needed. 04/22/14   Historical Provider, MD  risperiDONE (RISPERDAL) 2 MG  tablet Take 1 tablet (2 mg total) by mouth at bedtime. 05/20/14 05/20/15  Myrlene Broker, MD  tiZANidine (ZANAFLEX) 2 MG tablet Take 1 tablet by mouth 2 (two) times daily as needed for muscle spasms.  04/07/14   Historical Provider, MD      ANALYSIS: A 16 channel recording using standard 10 20 measurements is conducted for 26 minutes. There is a well-formed posterior dominant rhythm of 8 Hz which attenuates with eye opening. There is beta activity observing the frontal areas. Awake and drowsy activities are observed. Vertex sharp waves are observed. Photic stimulation and hyperventilation were not carried out. There is no focal or lateral slowing. There is no epileptiform activity is observed.   IMPRESSION: This recording shows mild global slowing for age. Otherwise, there is no epileptiform activity is observed.      Yarethzi Branan A. Gerilyn Pilgrim, M.D.  Diplomate, Biomedical engineer of Psychiatry and Neurology ( Neurology).

## 2014-09-07 ENCOUNTER — Other Ambulatory Visit (HOSPITAL_COMMUNITY): Payer: Self-pay | Admitting: Psychiatry

## 2014-09-07 ENCOUNTER — Other Ambulatory Visit: Payer: Self-pay | Admitting: Cardiology

## 2014-09-21 ENCOUNTER — Ambulatory Visit (HOSPITAL_COMMUNITY): Payer: Self-pay | Admitting: Psychiatry

## 2014-09-22 ENCOUNTER — Ambulatory Visit (INDEPENDENT_AMBULATORY_CARE_PROVIDER_SITE_OTHER): Payer: Medicare Other | Admitting: Psychiatry

## 2014-09-22 ENCOUNTER — Encounter (HOSPITAL_COMMUNITY): Payer: Self-pay | Admitting: Psychiatry

## 2014-09-22 VITALS — BP 173/77 | HR 61 | Ht 62.0 in | Wt 194.0 lb

## 2014-09-22 DIAGNOSIS — F259 Schizoaffective disorder, unspecified: Secondary | ICD-10-CM

## 2014-09-22 DIAGNOSIS — F419 Anxiety disorder, unspecified: Secondary | ICD-10-CM | POA: Diagnosis not present

## 2014-09-22 DIAGNOSIS — F329 Major depressive disorder, single episode, unspecified: Secondary | ICD-10-CM

## 2014-09-22 DIAGNOSIS — F250 Schizoaffective disorder, bipolar type: Secondary | ICD-10-CM

## 2014-09-22 MED ORDER — AMITRIPTYLINE HCL 25 MG PO TABS: 25.0000 mg | ORAL_TABLET | Freq: Every day | ORAL | Status: DC

## 2014-09-22 MED ORDER — QUETIAPINE FUMARATE 25 MG PO TABS: 25.0000 mg | ORAL_TABLET | Freq: Every day | ORAL | Status: DC

## 2014-09-22 MED ORDER — ALPRAZOLAM 0.5 MG PO TABS: 0.5000 mg | ORAL_TABLET | Freq: Three times a day (TID) | ORAL | Status: DC
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
## 2014-10-01 ENCOUNTER — Telehealth (HOSPITAL_COMMUNITY): Payer: Self-pay | Admitting: *Deleted

## 2014-10-01 ENCOUNTER — Other Ambulatory Visit (HOSPITAL_COMMUNITY): Payer: Self-pay | Admitting: Psychiatry

## 2014-10-01 MED ORDER — QUETIAPINE FUMARATE 100 MG PO TABS
ORAL_TABLET | ORAL | Status: DC
Start: 2014-10-01 — End: 2014-10-14

## 2014-10-01 NOTE — Telephone Encounter (Signed)
Spoke to daughter-will increase Seroquel to 100 to 200 at qhs

## 2014-10-01 NOTE — Telephone Encounter (Signed)
noted 

## 2014-10-01 NOTE — Telephone Encounter (Signed)
Pt daughter called stating that for the last week, pt have been thinking someone trying to kill her, cry, hallucinating. Per daughter, they don't know if it the new medication [address]) that Dr. Tenny Craw put her on. Per daughter, when her Risperidone was stopped for 5 months she did not have any problems and now she just started all of this. [phone] daughter number.

## 2014-10-14 ENCOUNTER — Encounter (HOSPITAL_COMMUNITY): Payer: Self-pay | Admitting: Psychiatry

## 2014-10-14 ENCOUNTER — Ambulatory Visit (INDEPENDENT_AMBULATORY_CARE_PROVIDER_SITE_OTHER): Payer: Medicare Other | Admitting: Psychiatry

## 2014-10-14 VITALS — BP 163/89 | HR 64 | Ht 62.0 in | Wt 193.0 lb

## 2014-10-14 DIAGNOSIS — F250 Schizoaffective disorder, bipolar type: Secondary | ICD-10-CM | POA: Diagnosis not present

## 2014-10-14 DIAGNOSIS — F419 Anxiety disorder, unspecified: Secondary | ICD-10-CM

## 2014-10-14 DIAGNOSIS — F329 Major depressive disorder, single episode, unspecified: Secondary | ICD-10-CM | POA: Diagnosis not present

## 2014-10-14 MED ORDER — RISPERIDONE 1 MG PO TABS: 1.0000 mg | ORAL_TABLET | Freq: Every day | ORAL | Status: DC

## 2014-10-14 MED ORDER — BENZTROPINE MESYLATE 1 MG PO TABS: 1.0000 mg | ORAL_TABLET | Freq: Every day | ORAL | Status: DC
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
## 2014-10-21 ENCOUNTER — Ambulatory Visit (HOSPITAL_COMMUNITY): Payer: Self-pay | Admitting: Psychiatry

## 2014-11-01 ENCOUNTER — Other Ambulatory Visit: Payer: Self-pay | Admitting: Cardiology

## 2014-11-05 ENCOUNTER — Other Ambulatory Visit (HOSPITAL_COMMUNITY): Payer: Self-pay | Admitting: Psychiatry

## 2014-11-11 ENCOUNTER — Ambulatory Visit (HOSPITAL_COMMUNITY): Payer: Self-pay | Admitting: Psychiatry

## 2014-11-15 ENCOUNTER — Encounter (HOSPITAL_COMMUNITY): Payer: Self-pay | Admitting: Emergency Medicine

## 2014-11-15 ENCOUNTER — Emergency Department (HOSPITAL_COMMUNITY): Payer: Medicare Other

## 2014-11-15 ENCOUNTER — Observation Stay (HOSPITAL_COMMUNITY)
Admission: EM | Admit: 2014-11-15 | Discharge: 2014-11-16 | Disposition: A | Discharge status: Home / Self Care | Payer: Medicare Other | Attending: Internal Medicine | Admitting: Internal Medicine

## 2014-11-15 DIAGNOSIS — J45909 Unspecified asthma, uncomplicated: Secondary | ICD-10-CM | POA: Insufficient documentation

## 2014-11-15 DIAGNOSIS — Z87891 Personal history of nicotine dependence: Secondary | ICD-10-CM | POA: Diagnosis not present

## 2014-11-15 DIAGNOSIS — G629 Polyneuropathy, unspecified: Secondary | ICD-10-CM | POA: Insufficient documentation

## 2014-11-15 DIAGNOSIS — Z8701 Personal history of pneumonia (recurrent): Secondary | ICD-10-CM | POA: Insufficient documentation

## 2014-11-15 DIAGNOSIS — N186 End stage renal disease: Secondary | ICD-10-CM

## 2014-11-15 DIAGNOSIS — K219 Gastro-esophageal reflux disease without esophagitis: Secondary | ICD-10-CM | POA: Diagnosis not present

## 2014-11-15 DIAGNOSIS — F259 Schizoaffective disorder, unspecified: Secondary | ICD-10-CM | POA: Diagnosis present

## 2014-11-15 DIAGNOSIS — R079 Chest pain, unspecified: Secondary | ICD-10-CM | POA: Diagnosis not present

## 2014-11-15 DIAGNOSIS — Z79899 Other long term (current) drug therapy: Secondary | ICD-10-CM | POA: Insufficient documentation

## 2014-11-15 DIAGNOSIS — J440 Chronic obstructive pulmonary disease with acute lower respiratory infection: Secondary | ICD-10-CM

## 2014-11-15 DIAGNOSIS — M810 Age-related osteoporosis without current pathological fracture: Secondary | ICD-10-CM | POA: Diagnosis not present

## 2014-11-15 DIAGNOSIS — I10 Essential (primary) hypertension: Secondary | ICD-10-CM | POA: Diagnosis present

## 2014-11-15 DIAGNOSIS — E7800 Pure hypercholesterolemia, unspecified: Secondary | ICD-10-CM | POA: Diagnosis not present

## 2014-11-15 DIAGNOSIS — I120 Hypertensive chronic kidney disease with stage 5 chronic kidney disease or end stage renal disease: Secondary | ICD-10-CM | POA: Insufficient documentation

## 2014-11-15 DIAGNOSIS — Z992 Dependence on renal dialysis: Secondary | ICD-10-CM | POA: Diagnosis not present

## 2014-11-15 DIAGNOSIS — J209 Acute bronchitis, unspecified: Secondary | ICD-10-CM | POA: Diagnosis present

## 2014-11-15 DIAGNOSIS — D638 Anemia in other chronic diseases classified elsewhere: Secondary | ICD-10-CM | POA: Diagnosis present

## 2014-11-15 DIAGNOSIS — M1990 Unspecified osteoarthritis, unspecified site: Secondary | ICD-10-CM | POA: Diagnosis not present

## 2014-11-15 LAB — TROPONIN I
Troponin I: <0.03 ng/mL (ref <0.031)
Troponin I: <0.03 ng/mL (ref <0.031)

## 2014-11-15 LAB — CBC
HCT: 40.8 % (ref 36.0–46.0)
HEMOGLOBIN: 12.9 g/dL (ref 12.0–15.0)
MCH: 28.5 pg (ref 26.0–34.0)
MCHC: 31.6 g/dL (ref 30.0–36.0)
MCV: 90.1 fL (ref 78.0–100.0)
Platelets: 208 10*3/uL (ref 150–400)
RBC: 4.53 MIL/uL (ref 3.87–5.11)
RDW: 15.5 % (ref 11.5–15.5)
WBC: 6.6 10*3/uL (ref 4.0–10.5)

## 2014-11-15 LAB — BASIC METABOLIC PANEL
Anion gap: 12 (ref 5–15)
BUN: 11 mg/dL (ref 6–20)
CALCIUM: 8.5 mg/dL — AB (ref 8.9–10.3)
CO2: 26 mmol/L (ref 22–32)
CREATININE: 4.55 mg/dL — AB (ref 0.44–1.00)
Chloride: 96 mmol/L — ABNORMAL LOW (ref 101–111)
GFR, EST AFRICAN AMERICAN: 10 mL/min — AB (ref >60)
GFR, EST NON AFRICAN AMERICAN: 9 mL/min — AB (ref >60)
Glucose, Bld: 104 mg/dL — ABNORMAL HIGH (ref 65–99)
Potassium: 3.5 mmol/L (ref 3.5–5.1)
SODIUM: 134 mmol/L — AB (ref 135–145)

## 2014-11-15 LAB — PROTIME-INR
INR: 1.32 (ref 0.00–1.49)
PROTHROMBIN TIME: 16.5 s — AB (ref 11.6–15.2)

## 2014-11-15 MED ORDER — NIFEDIPINE ER OSMOTIC RELEASE 30 MG PO TB24
30.0000 mg | ORAL_TABLET | Freq: Every day | ORAL | Status: DC
  Administered 2014-11-16: 30 mg via ORAL
  Filled 2014-11-15: qty 1

## 2014-11-15 MED ORDER — ATORVASTATIN CALCIUM 40 MG PO TABS
40.0000 mg | ORAL_TABLET | Freq: Every day | ORAL | Status: DC
  Administered 2014-11-16: 40 mg via ORAL
  Filled 2014-11-15: qty 1

## 2014-11-15 MED ORDER — ASPIRIN 81 MG PO CHEW
324.0000 mg | CHEWABLE_TABLET | Freq: Once | ORAL | Status: AC
  Administered 2014-11-15: 324 mg via ORAL
  Filled 2014-11-15: qty 4

## 2014-11-15 MED ORDER — AMIODARONE HCL 200 MG PO TABS
200.0000 mg | ORAL_TABLET | Freq: Every day | ORAL | Status: DC
  Administered 2014-11-16: 200 mg via ORAL
  Filled 2014-11-15: qty 1

## 2014-11-15 MED ORDER — HYDROXYZINE PAMOATE 25 MG PO CAPS: 25.0000 mg | ORAL_CAPSULE | Freq: Every day | ORAL | Status: DC | PRN

## 2014-11-15 MED ORDER — TIZANIDINE HCL 4 MG PO TABS: 2.0000 mg | ORAL_TABLET | Freq: Two times a day (BID) | ORAL | Status: DC | PRN

## 2014-11-15 MED ORDER — ENOXAPARIN SODIUM 30 MG/0.3ML ~~LOC~~ SOLN: 30.0000 mg | SUBCUTANEOUS | Status: DC

## 2014-11-15 MED ORDER — OXYCODONE HCL 5 MG PO TABS: 10.0000 mg | ORAL_TABLET | Freq: Four times a day (QID) | ORAL | Status: DC | PRN

## 2014-11-15 MED ORDER — GABAPENTIN 100 MG PO CAPS
100.0000 mg | ORAL_CAPSULE | Freq: Every day | ORAL | Status: DC
  Administered 2014-11-16: 100 mg via ORAL
  Filled 2014-11-15: qty 1

## 2014-11-15 MED ORDER — PANTOPRAZOLE SODIUM 40 MG PO TBEC: 40.0000 mg | DELAYED_RELEASE_TABLET | Freq: Every day | ORAL | Status: DC

## 2014-11-15 MED ORDER — LABETALOL HCL 200 MG PO TABS
200.0000 mg | ORAL_TABLET | Freq: Two times a day (BID) | ORAL | Status: DC
  Administered 2014-11-16: 200 mg via ORAL
  Filled 2014-11-15 (×2): qty 1

## 2014-11-15 MED ORDER — PANTOPRAZOLE SODIUM 40 MG PO TBEC
40.0000 mg | DELAYED_RELEASE_TABLET | Freq: Every day | ORAL | Status: DC
  Administered 2014-11-16: 40 mg via ORAL
  Filled 2014-11-15: qty 1

## 2014-11-15 MED ORDER — IPRATROPIUM-ALBUTEROL 0.5-2.5 (3) MG/3ML IN SOLN
3.0000 mL | Freq: Once | RESPIRATORY_TRACT | Status: AC
  Administered 2014-11-15: 3 mL via RESPIRATORY_TRACT
  Filled 2014-11-15: qty 3

## 2014-11-15 MED ORDER — HYDRALAZINE HCL 20 MG/ML IJ SOLN
10.0000 mg | INTRAMUSCULAR | Status: DC | PRN
  Administered 2014-11-16: 10 mg via INTRAVENOUS
  Filled 2014-11-15: qty 1

## 2014-11-15 MED ORDER — ENOXAPARIN SODIUM 30 MG/0.3ML ~~LOC~~ SOLN
30.0000 mg | SUBCUTANEOUS | Status: DC
  Administered 2014-11-15: 30 mg via SUBCUTANEOUS
  Filled 2014-11-15: qty 0.3

## 2014-11-15 MED ORDER — RISPERIDONE 1 MG PO TABS
1.0000 mg | ORAL_TABLET | Freq: Every day | ORAL | Status: DC
  Filled 2014-11-15: qty 1

## 2014-11-15 MED ORDER — NIFEDIPINE ER OSMOTIC RELEASE 30 MG PO TB24: 30.0000 mg | ORAL_TABLET | Freq: Every day | ORAL | Status: DC

## 2014-11-15 MED ORDER — DONEPEZIL HCL 5 MG PO TABS
5.0000 mg | ORAL_TABLET | Freq: Every day | ORAL | Status: DC
  Filled 2014-11-15: qty 1

## 2014-11-15 MED ORDER — GABAPENTIN 100 MG PO CAPS: 100.0000 mg | ORAL_CAPSULE | Freq: Every day | ORAL | Status: DC

## 2014-11-15 MED ORDER — ALPRAZOLAM 0.5 MG PO TABS
0.5000 mg | ORAL_TABLET | Freq: Three times a day (TID) | ORAL | Status: DC
  Administered 2014-11-16 (×2): 0.5 mg via ORAL
  Filled 2014-11-15 (×4): qty 1

## 2014-11-15 MED ORDER — AMITRIPTYLINE HCL 25 MG PO TABS
25.0000 mg | ORAL_TABLET | Freq: Every day | ORAL | Status: DC
  Filled 2014-11-15: qty 1

## 2014-11-15 MED ORDER — IPRATROPIUM BROMIDE 0.02 % IN SOLN
500.0000 ug | Freq: Four times a day (QID) | RESPIRATORY_TRACT | Status: DC | PRN
  Administered 2014-11-16: 500 ug via RESPIRATORY_TRACT
  Filled 2014-11-15 (×2): qty 2.5

## 2014-11-15 MED ORDER — ACETAMINOPHEN 325 MG PO TABS: 650.0000 mg | ORAL_TABLET | ORAL | Status: DC | PRN

## 2014-11-15 MED ORDER — METHYLPREDNISOLONE SODIUM SUCC 125 MG IJ SOLR
125.0000 mg | Freq: Once | INTRAMUSCULAR | Status: DC
  Filled 2014-11-15: qty 2

## 2014-11-15 MED ORDER — QUETIAPINE FUMARATE 100 MG PO TABS
100.0000 mg | ORAL_TABLET | Freq: Two times a day (BID) | ORAL | Status: DC
  Administered 2014-11-16: 100 mg via ORAL
  Filled 2014-11-15 (×2): qty 1

## 2014-11-15 MED ORDER — BENZTROPINE MESYLATE 1 MG PO TABS
1.0000 mg | ORAL_TABLET | Freq: Every day | ORAL | Status: DC
  Filled 2014-11-15: qty 1

## 2014-11-15 MED ORDER — ADULT MULTIVITAMIN W/MINERALS CH
ORAL_TABLET | Freq: Every day | ORAL | Status: DC
  Administered 2014-11-16: 1 via ORAL
  Filled 2014-11-15: qty 1

## 2014-11-15 MED ORDER — ONDANSETRON HCL 4 MG/2ML IJ SOLN: 4.0000 mg | Freq: Four times a day (QID) | INTRAMUSCULAR | Status: DC | PRN

## 2014-11-15 NOTE — ED Provider Notes (Signed)
CSN: [id number]     Arrival date & time 11/15/14  1207 History   First MD Initiated Contact with Patient 11/15/14 1251     Chief Complaint  Patient presents with  . Chest Pain     (Consider location/radiation/quality/duration/timing/severity/associated sxs/prior Treatment) HPI Comments: 74 year old female with extensive past medical history including ESRD on HD, COPD, hypertension, hyperlipidemia, bipolar disorder who presents with chest pain. The patient states that she has had one to 2 weeks of intermittent episodes of chest pain that occurred daily and last for several seconds. The chest pain is central and nonradiating. The chest pain is not associated with shortness of breath or diaphoresis. She has had some associated nausea but no vomiting, diarrhea, or fevers. She reports that she was having intermittent episodes of chest pain during dialysis but the chest pain got worse after dialysis which is why she was sent here. The patient had a normal dialysis session today. She states that she is taking all of her medications today.  Patient is a 74 y.o. female presenting with chest pain. The history is provided by the patient.  Chest Pain   Past Medical History  Diagnosis Date  . Essential hypertension, benign   . Hypercholesterolemia   . GERD (gastroesophageal reflux disease)   . Asthma   . Osteoporosis   . ESRD (end stage renal disease) on dialysis ([place])   . Anemia of chronic disease   . Peripheral neuropathy ([place])   . Schizoaffective disorder   . Arthritis   . History of pneumonia    Past Surgical History  Procedure Laterality Date  . Tubal ligation    . Breast biopsy    . Refractive surgery      Right eye  . Bone marrow aspiration    . Bone marrow biopsy    . Insertion of dialysis catheter  01/23/2011    Procedure: INSERTION OF DIALYSIS CATHETER;  Surgeon: Angelia Mould, MD;  Location: [facility];  Service: Vascular;  Laterality: N/A;  Insertion of Dialysis Catheter-  Right Internal Jugular  . Yag laser application  14/43/1540    Procedure: YAG LASER APPLICATION;  Surgeon: Elta Guadeloupe T. Gershon Crane, MD;  Location: [facility];  Service: Ophthalmology;  Laterality: Left;  . Esophagogastroduodenoscopy  05/06/2003    Dr.Rourk- schatski's ring o/w normal esophagus, stomach and first and second portions of the duodenum s/p dilation  . Colonoscopy  03/10/2010    Dr.Rourk- pigmentation of the rectum and coln consistent with melanosis coli, o/w unremarkable appearing colonic and rectal mucosa.  . Esophagogastroduodenoscopy N/A 05/11/2014    Procedure: ESOPHAGOGASTRODUODENOSCOPY (EGD);  Surgeon: Daneil Dolin, MD;  [facility];  Service: Endoscopy;  Laterality: N/A;  245  . Esophageal dilation N/A 05/11/2014    Procedure: ESOPHAGEAL DILATION;  Surgeon: Daneil Dolin, MD;  [facility];  Service: Endoscopy;  Laterality: N/A;   Family History  Problem Relation Age of Onset  . Alcohol abuse Brother   . Bipolar disorder Brother   . Dementia Mother   . Depression Mother   . ADD / ADHD Neg Hx   . Anxiety disorder Neg Hx   . Drug abuse Neg Hx   . OCD Neg Hx   . Paranoid behavior Neg Hx   . Schizophrenia Neg Hx   . Seizures Neg Hx   . Physical abuse Neg Hx   . Sexual abuse Neg Hx   . Healthy Daughter   . Healthy Daughter   . Healthy Daughter  Social History  Substance Use Topics  . Smoking status: Former Smoker -- 2.00 packs/day for 37 years    Types: Cigarettes    Quit date: 05/11/1998  . Smokeless tobacco: Former Systems developer    Quit date: 10/22/2001  . Alcohol Use: No   OB History    No data available     Review of Systems  Cardiovascular: Positive for chest pain.    10 Systems reviewed and are negative for acute change except as noted in the HPI.   Allergies  Neurontin; Ativan; and Ferrous gluconate  Home Medications   Prior to Admission medications   Medication Sig Start Date End Date Taking? Authorizing Provider  ALPRAZolam Duanne Moron)  0.5 MG tablet Take 1 tablet (0.5 mg total) by mouth 3 (three) times daily. 09/22/14 09/22/15 Yes Cloria Spring, MD  amiodarone (PACERONE) 200 MG tablet TAKE (1) TABLET BY MOUTH ONCE DAILY. 11/01/14  Yes Satira Sark, MD  amitriptyline (ELAVIL) 25 MG tablet Take 1 tablet (25 mg total) by mouth at bedtime. 09/22/14  Yes Cloria Spring, MD  atorvastatin (LIPITOR) 40 MG tablet Take 1 tablet by mouth daily. 03/06/13  Yes Historical Provider, MD  B Complex-C-Folic Acid (RENALPREN) 1 MG CAPS Take by mouth daily.   Yes Historical Provider, MD  benztropine (COGENTIN) 1 MG tablet Take 1 tablet (1 mg total) by mouth at bedtime. 10/14/14 10/14/15 Yes Cloria Spring, MD  donepezil (ARICEPT) 5 MG tablet Take 5 mg by mouth at bedtime.  03/21/11  Yes Historical Provider, MD  gabapentin (NEURONTIN) 100 MG capsule Take 100 mg by mouth daily.   Yes Historical Provider, MD  hydrOXYzine (VISTARIL) 25 MG capsule Take 1 capsule by mouth daily as needed for anxiety.  11/01/14  Yes Historical Provider, MD  ibuprofen (ADVIL,MOTRIN) 200 MG tablet Take 400 mg by mouth daily as needed for moderate pain.   Yes Historical Provider, MD  ipratropium (ATROVENT) 0.02 % nebulizer solution Take 500 mcg by nebulization 4 (four) times daily as needed. Shortness of Breath 11/16/11  Yes Rolland Porter, MD  labetalol (NORMODYNE) 200 MG tablet Take 1 tablet by mouth 2 (two) times daily. 11/05/14  Yes Historical Provider, MD  NIFEdipine (PROCARDIA-XL/ADALAT-CC/NIFEDICAL-XL) 30 MG 24 hr tablet Take 30 mg by mouth daily.   Yes Historical Provider, MD  Oxycodone HCl 10 MG TABS Take 1 tablet by mouth 4 (four) times daily as needed. 04/22/14  Yes Historical Provider, MD  pantoprazole (PROTONIX) 40 MG tablet Take 40 mg by mouth daily.   Yes Historical Provider, MD  QUEtiapine (SEROQUEL) 100 MG tablet Take 1 tablet by mouth 2 (two) times daily. 11/01/14  Yes Historical Provider, MD  risperiDONE (RISPERDAL) 1 MG tablet Take 1 tablet (1 mg total) by mouth at  bedtime. 10/14/14 10/14/15 Yes Cloria Spring, MD  tiZANidine (ZANAFLEX) 2 MG tablet Take 1 tablet by mouth 2 (two) times daily as needed for muscle spasms.  04/07/14  Yes Historical Provider, MD   BP 201/77 mmHg  Pulse 60  Temp(Src) 97.8 F (36.6 C) (Oral)  Resp 12  Ht $R'4\' 8"'ad$  (1.422 m)  Wt 189 lb (85.73 kg)  BMI 42.40 kg/m2  SpO2 94% Physical Exam  Constitutional: She is oriented to person, place, and time. She appears well-developed and well-nourished. No distress.  HENT:  Head: Normocephalic and atraumatic.  Moist mucous membranes  Eyes: Conjunctivae are normal. Pupils are equal, round, and reactive to light.  Neck: Neck supple.  Cardiovascular: Normal rate, regular rhythm and  normal heart sounds.   No murmur heard. Pulmonary/Chest: Effort normal. No respiratory distress.  Diminished breath sounds bilaterally  Abdominal: Soft. Bowel sounds are normal. She exhibits no distension. There is no tenderness.  Musculoskeletal:  1+ BLE edema  Neurological: She is alert and oriented to person, place, and time.  Fluent speech  Skin: Skin is warm and dry.  Psychiatric: She has a normal mood and affect. Judgment normal.  Nursing note and vitals reviewed.   ED Course  Procedures (including critical care time) Labs Review Labs Reviewed  BASIC METABOLIC PANEL - Abnormal; Notable for the following:    Sodium 134 (*)    Chloride 96 (*)    Glucose, Bld 104 (*)    Creatinine, Ser 4.55 (*)    Calcium 8.5 (*)    GFR calc non Af Amer 9 (*)    GFR calc Af Amer 10 (*)    All other components within normal limits  PROTIME-INR - Abnormal; Notable for the following:    Prothrombin Time 16.5 (*)    All other components within normal limits  CBC  TROPONIN I    Imaging Review Dg Chest 2 View  11/15/2014  CLINICAL DATA:  Initial encounter for chest pain with bronchitis. EXAM: CHEST  2 VIEW COMPARISON:  04/21/2014.  12/07/2013. FINDINGS: Low volume film. There is pulmonary vascular congestion  without overt pulmonary edema. The cardio pericardial silhouette is enlarged. Scarring in the left mid lung is unchanged. Vascular stent device noted in the left subclavian region. No substantial pleural effusion. Telemetry leads overlie the chest. IMPRESSION: Stable exam. Cardiomegaly with vascular congestion and scarring in the left mid lung. Electronically Signed   By: Misty Stanley M.D.   On: 11/15/2014 13:33   I have personally reviewed and evaluated these images and lab results as part of my medical decision-making.   EKG Interpretation   Date/Time:  Monday November 15 2014 12:12:55 EDT Ventricular Rate:  58 PR Interval:  206 QRS Duration: 127 QT Interval:  500 QTC Calculation: 491 R Axis:   -98 Text Interpretation:  Sinus rhythm Nonspecific IVCD with LAD Anterior  infarct, old Borderline T abnormalities, inferior leads No significant  change since last tracing Confirmed by Avraj Lindroth MD, Makalia Bare [phone]) on  11/15/2014 1:05:33 PM     Medications  aspirin chewable tablet 324 mg (324 mg Oral Given 11/15/14 1330)  ipratropium-albuterol (DUONEB) 0.5-2.5 (3) MG/3ML nebulizer solution 3 mL (3 mLs Nebulization Given 11/15/14 1524)    MDM   Final diagnoses:  Chest pain, unspecified chest pain type   74 year old female on dialysis who presents with intermittent episodes of chest pain that have been occurring daily for the past week. On arrival, the patient was awake, alert, comfortable and in no acute distress. Vital signs notable for hypertension at around 469 systolic, O2 sat 62-95% on room air. No obvious wheezes on exam. The patient did state that she was due for her home breathing treatments. Gave the patient aspirin and a duo neb and obtained above lab work, chest x-ray, and EKG. EKG without significant change from previous. Chest x-ray shows cardiomegaly with vascular congestion but no obvious pulmonary edema. Lab work shows normal potassium, negative troponin. The patient's HEART score is  5-6; she has multiple risk factors for CAD and I am concerned about ACS. She denies any shortness of breath and I feel that PE is less likely. I have spoken w/ hospitalist regarding admission and patient will be admitted to general medicine for observation  and chest pain rule out. Patient in agreement with plan and admitted in stable condition.  Sharlett Iles, MD 11/15/14 [phone]

## 2014-11-15 NOTE — H&P (Signed)
[facility] History and Physical  [name] GEX:[id number] DOB: 19-May-1940 DOA: 11/15/2014  Referring physician: ER PCP: Asencion Noble, MD   Chief Complaint: Chest pain  HPI: [name] is a 74 y.o. female  This is a 74 year old obese lady who has end-stage renal disease and is on hemodialysis. She had been taking hemodialysis today and during the session, she proceeded to have chest pain which seemed to radiate to her back. On closer questioning, she tells me that she has had this pain for the last 3-4 days. It seems to be associated with nausea but is not associated with dyspnea or sweating. She has had some nausea with it but no vomiting. She denies any abdominal pain, diarrhea or fevers. She has hypertension. She is now being admitted for further investigations.   Review of Systems:  Apart from symptoms above, all systems are negative.  Past Medical History  Diagnosis Date  . Essential hypertension, benign   . Hypercholesterolemia   . GERD (gastroesophageal reflux disease)   . Asthma   . Osteoporosis   . ESRD (end stage renal disease) on dialysis ([place])   . Anemia of chronic disease   . Peripheral neuropathy ([place])   . Schizoaffective disorder   . Arthritis   . History of pneumonia    Past Surgical History  Procedure Laterality Date  . Tubal ligation    . Breast biopsy    . Refractive surgery      Right eye  . Bone marrow aspiration    . Bone marrow biopsy    . Insertion of dialysis catheter  01/23/2011    Procedure: INSERTION OF DIALYSIS CATHETER;  Surgeon: Angelia Mould, MD;  Location: [facility];  Service: Vascular;  Laterality: N/A;  Insertion of Dialysis Catheter- Right Internal Jugular  . Yag laser application  01/17/7251    Procedure: YAG LASER APPLICATION;  Surgeon: Heather Guadeloupe T. Gershon Crane, MD;  Location: [facility];  Service: Ophthalmology;  Laterality: Left;  . Esophagogastroduodenoscopy  05/06/2003    Dr.Rourk- schatski's ring o/w normal esophagus, stomach  and first and second portions of the duodenum s/p dilation  . Colonoscopy  03/10/2010    Dr.Rourk- pigmentation of the rectum and coln consistent with melanosis coli, o/w unremarkable appearing colonic and rectal mucosa.  . Esophagogastroduodenoscopy N/A 05/11/2014    Procedure: ESOPHAGOGASTRODUODENOSCOPY (EGD);  Surgeon: Heather Dolin, MD;  [facility];  Service: Endoscopy;  Laterality: N/A;  245  . Esophageal dilation N/A 05/11/2014    Procedure: ESOPHAGEAL DILATION;  Surgeon: Heather Dolin, MD;  [facility];  Service: Endoscopy;  Laterality: N/A;   Social History:  reports that she quit smoking about 16 years ago. Her smoking use included Cigarettes. She has a 74 pack-year smoking history. She quit smokeless tobacco use about 13 years ago. She reports that she does not drink alcohol or use illicit drugs.  Allergies  Allergen Reactions  . Neurontin [Gabapentin] Other (See Comments)    Had about every known side effect with 35m three a day on this. DO NOT TRY THIS AGAIN!  . Ativan [Lorazepam] Nausea Only and Other (See Comments)    Disoriented, not feel like herself   . Ferrous Gluconate Nausea Only    hypotension    Family History  Problem Relation Age of Onset  . Alcohol abuse Brother   . Bipolar disorder Brother   . Dementia Mother   . Depression Mother   . ADD / ADHD Neg Hx   .  Anxiety disorder Neg Hx   . Drug abuse Neg Hx   . OCD Neg Hx   . Paranoid behavior Neg Hx   . Schizophrenia Neg Hx   . Seizures Neg Hx   . Physical abuse Neg Hx   . Sexual abuse Neg Hx   . Healthy Daughter   . Healthy Daughter   . Healthy Daughter     Prior to Admission medications   Medication Sig Start Date End Date Taking? Authorizing Provider  ALPRAZolam Duanne Moron) 0.5 MG tablet Take 1 tablet (0.5 mg total) by mouth 3 (three) times daily. 09/22/14 09/22/15 Yes Cloria Spring, MD  amiodarone (PACERONE) 200 MG tablet TAKE (1) TABLET BY MOUTH ONCE DAILY. 11/01/14  Yes Satira Sark, MD  amitriptyline (ELAVIL) 25 MG tablet Take 1 tablet (25 mg total) by mouth at bedtime. 09/22/14  Yes Cloria Spring, MD  atorvastatin (LIPITOR) 40 MG tablet Take 1 tablet by mouth daily. 03/06/13  Yes Historical Provider, MD  B Complex-C-Folic Acid (RENALPREN) 1 MG CAPS Take by mouth daily.   Yes Historical Provider, MD  benztropine (COGENTIN) 1 MG tablet Take 1 tablet (1 mg total) by mouth at bedtime. 10/14/14 10/14/15 Yes Cloria Spring, MD  donepezil (ARICEPT) 5 MG tablet Take 5 mg by mouth at bedtime.  03/21/11  Yes Historical Provider, MD  gabapentin (NEURONTIN) 100 MG capsule Take 100 mg by mouth daily.   Yes Historical Provider, MD  hydrOXYzine (VISTARIL) 25 MG capsule Take 1 capsule by mouth daily as needed for anxiety.  11/01/14  Yes Historical Provider, MD  ibuprofen (ADVIL,MOTRIN) 200 MG tablet Take 400 mg by mouth daily as needed for moderate pain.   Yes Historical Provider, MD  ipratropium (ATROVENT) 0.02 % nebulizer solution Take 500 mcg by nebulization 4 (four) times daily as needed. Shortness of Breath 11/16/11  Yes Rolland Porter, MD  labetalol (NORMODYNE) 200 MG tablet Take 1 tablet by mouth 2 (two) times daily. 11/05/14  Yes Historical Provider, MD  NIFEdipine (PROCARDIA-XL/ADALAT-CC/NIFEDICAL-XL) 30 MG 24 hr tablet Take 30 mg by mouth daily.   Yes Historical Provider, MD  Oxycodone HCl 10 MG TABS Take 1 tablet by mouth 4 (four) times daily as needed. 04/22/14  Yes Historical Provider, MD  pantoprazole (PROTONIX) 40 MG tablet Take 40 mg by mouth daily.   Yes Historical Provider, MD  QUEtiapine (SEROQUEL) 100 MG tablet Take 1 tablet by mouth 2 (two) times daily. 11/01/14  Yes Historical Provider, MD  risperiDONE (RISPERDAL) 1 MG tablet Take 1 tablet (1 mg total) by mouth at bedtime. 10/14/14 10/14/15 Yes Cloria Spring, MD  tiZANidine (ZANAFLEX) 2 MG tablet Take 1 tablet by mouth 2 (two) times daily as needed for muscle spasms.  04/07/14  Yes Historical Provider, MD   Physical  Exam: Filed Vitals:   11/15/14 1400 11/15/14 1430 11/15/14 1500 11/15/14 1526  BP: 213/76 207/81 201/77   Pulse: 57 58 60   Temp:      TempSrc:      Resp: _0 Height:      Weight:      SpO2: 92% 93% 93% 94%    Wt Readings from Last 3 Encounters:  11/15/14 85.73 kg (189 lb)  10/14/14 87.544 kg (193 lb)  09/22/14 87.998 kg (194 lb)    General:  Appears calm and comfortable . She does not appear to be in pain at rest. She is obese. Eyes: PERRL, normal lids, irises & conjunctiva ENT: grossly  normal hearing, lips & tongue Neck: no LAD, masses or thyromegaly Cardiovascular: RRR, no m/r/g. No LE edema. Telemetry: SR, no arrhythmias  Respiratory: Bilateral wheezing which is moderately tight. Abdomen: soft, ntnd Skin: no rash or induration seen on limited exam Musculoskeletal: grossly normal tone BUE/BLE Psychiatric: grossly normal mood and affect, speech fluent and appropriate Neurologic: grossly non-focal.          Labs on Admission:  Basic Metabolic Panel:  Recent Labs Lab 11/15/14 1244  NA 134*  K 3.5  CL 96*  CO2 26  GLUCOSE 104*  BUN 11  CREATININE 4.55*  CALCIUM 8.5*   Liver Function Tests: No results for input(s): AST, ALT, ALKPHOS, BILITOT, PROT, ALBUMIN in the last 168 hours. No results for input(s): LIPASE, AMYLASE in the last 168 hours. No results for input(s): AMMONIA in the last 168 hours. CBC:  Recent Labs Lab 11/15/14 1244  WBC 6.6  HGB 12.9  HCT 40.8  MCV 90.1  PLT 208   Cardiac Enzymes:  Recent Labs Lab 11/15/14 1244  TROPONINI <0.03    BNP (last 3 results) No results for input(s): BNP in the last 8760 hours.  ProBNP (last 3 results) No results for input(s): PROBNP in the last 8760 hours.  CBG: No results for input(s): GLUCAP in the last 168 hours.  Radiological Exams on Admission: Dg Chest 2 View  11/15/2014  CLINICAL DATA:  Initial encounter for chest pain with bronchitis. EXAM: CHEST  2 VIEW COMPARISON:   04/21/2014.  12/07/2013. FINDINGS: Low volume film. There is pulmonary vascular congestion without overt pulmonary edema. The cardio pericardial silhouette is enlarged. Scarring in the left mid lung is unchanged. Vascular stent device noted in the left subclavian region. No substantial pleural effusion. Telemetry leads overlie the chest. IMPRESSION: Stable exam. Cardiomegaly with vascular congestion and scarring in the left mid lung. Electronically Signed   By: Misty Stanley M.D.   On: 11/15/2014 13:33    EKG: Independently reviewed. No acute ST-T wave changes.  Assessment/Plan   1. Chest pain. The etiology is unclear. She certainly has risk factors for coronary artery disease. Serial cardiac enzymes. Echocardiogram. Cardiology consultation. 2. Hypertension. Blood pressure is elevated. Continue on home medications and intravenous hydralazine as needed. 3. COPD/bronchospasm. One dose of IV steroids. 4. End-stage renal disease on hemodialysis. Nephrology consultation. 5. Anemia of chronic kidney disease. Stable.  She'll be admitted to telemetry. Further recommendations will depend on patient's hospital progress.   Code Status: Full code.   DVT Prophylaxis: Lovenox.  Family Communication: I discussed the plan with the patient at the bedside.   Disposition Plan: Home when medically stable  Time spent: 45 minutes.  Doree Albee [facility] Pager [phone].

## 2014-11-15 NOTE — ED Notes (Signed)
Pt finished dialysis today and was c/o epigastric/chest pain.  Denies pain currently.  Took tums pta.

## 2014-11-15 NOTE — ED Notes (Signed)
Pt currently denies pain.  

## 2014-11-16 ENCOUNTER — Observation Stay (HOSPITAL_BASED_OUTPATIENT_CLINIC_OR_DEPARTMENT_OTHER): Payer: Medicare Other

## 2014-11-16 ENCOUNTER — Other Ambulatory Visit: Payer: Self-pay

## 2014-11-16 DIAGNOSIS — R079 Chest pain, unspecified: Secondary | ICD-10-CM

## 2014-11-16 MED ORDER — HYDRALAZINE HCL 25 MG PO TABS
50.0000 mg | ORAL_TABLET | Freq: Three times a day (TID) | ORAL | Status: DC
  Administered 2014-11-16: 50 mg via ORAL
  Filled 2014-11-16: qty 2

## 2014-11-16 MED ORDER — NIFEDIPINE ER OSMOTIC RELEASE 30 MG PO TB24: 60.0000 mg | ORAL_TABLET | Freq: Every day | ORAL | Status: DC

## 2014-11-16 MED ORDER — NIFEDIPINE ER 60 MG PO TB24: 60.0000 mg | ORAL_TABLET | Freq: Every day | ORAL | Status: DC

## 2014-11-16 MED ORDER — NIFEDIPINE ER OSMOTIC RELEASE 30 MG PO TB24
30.0000 mg | ORAL_TABLET | Freq: Once | ORAL | Status: AC
  Administered 2014-11-16: 30 mg via ORAL
  Filled 2014-11-16: qty 1

## 2014-11-16 MED ORDER — SORBITOL 70 % SOLN: 30.0000 mL | Freq: Every day | Status: DC | PRN

## 2014-11-16 NOTE — Progress Notes (Signed)
  Echocardiogram 2D Echocardiogram has been performed.  [name], [name] R 11/16/2014, 12:23 PM

## 2014-11-16 NOTE — Progress Notes (Signed)
Pt given medications from home by daughter, Dorann LodgeJuanita at 21:00.  Instructed daughter to not give pt any medications from home without notifying pt's nurse.  Daughter verbalized understanding.

## 2014-11-16 NOTE — Progress Notes (Signed)
Pt's SBP was 205.  MD informed and prescribed hydralazine 10mg IV once.

## 2014-11-16 NOTE — Consult Note (Signed)
Reason for Consult: End-stage renal disease Referring Physician: Dr. Pasty Spillers [name] is an 74 y.o. female.  HPI: She is a patient on as history of hypertension, end-stage renal disease on maintenance hemodialysis, history of anxiety disorder presently came with complaints of chest pain since she left dialysis unit. According to the patient she had some pain on her chest around epigastric area before she finished dialysis but improved when dialysis was finished. When she went home in the afternoon she starts having chest pain hence was brought by her family's. Patient also states that she has this pain once in a while. It radiates seems to her back and also to her upper arm. Sharp. She denies any cough chills or sweating. She denies also any nausea but complains of constipation.  Past Medical History  Diagnosis Date  . Essential hypertension, benign   . Hypercholesterolemia   . GERD (gastroesophageal reflux disease)   . Asthma   . Osteoporosis   . ESRD (end stage renal disease) on dialysis ([place])   . Anemia of chronic disease   . Peripheral neuropathy ([place])   . Schizoaffective disorder   . Arthritis   . History of pneumonia     Past Surgical History  Procedure Laterality Date  . Tubal ligation    . Breast biopsy    . Refractive surgery      Right eye  . Bone marrow aspiration    . Bone marrow biopsy    . Insertion of dialysis catheter  01/23/2011    Procedure: INSERTION OF DIALYSIS CATHETER;  Surgeon: Angelia Mould, MD;  Location: [facility];  Service: Vascular;  Laterality: N/A;  Insertion of Dialysis Catheter- Right Internal Jugular  . Yag laser application  81/01/7508    Procedure: YAG LASER APPLICATION;  Surgeon: Elta Guadeloupe T. Gershon Crane, MD;  Location[facility];  Service: Ophthalmology;  Laterality: Left;  . Esophagogastroduodenoscopy  05/06/2003    Dr.Rourk- schatski's ring o/w normal esophagus, stomach and first and second portions of the duodenum s/p dilation  . Colonoscopy   03/10/2010    Dr.Rourk- pigmentation of the rectum and coln consistent with melanosis coli, o/w unremarkable appearing colonic and rectal mucosa.  . Esophagogastroduodenoscopy N/A 05/11/2014    Procedure: ESOPHAGOGASTRODUODENOSCOPY (EGD);  Surgeon: Daneil Dolin, MD;  [facility];  Service: Endoscopy;  Laterality: N/A;  245  . Esophageal dilation N/A 05/11/2014    Procedure: ESOPHAGEAL DILATION;  Surgeon: Daneil Dolin, MD;  [facility];  Service: Endoscopy;  Laterality: N/A;    Family History  Problem Relation Age of Onset  . Alcohol abuse Brother   . Bipolar disorder Brother   . Dementia Mother   . Depression Mother   . ADD / ADHD Neg Hx   . Anxiety disorder Neg Hx   . Drug abuse Neg Hx   . OCD Neg Hx   . Paranoid behavior Neg Hx   . Schizophrenia Neg Hx   . Seizures Neg Hx   . Physical abuse Neg Hx   . Sexual abuse Neg Hx   . Healthy Daughter   . Healthy Daughter   . Healthy Daughter     Social History:  reports that she quit smoking about 16 years ago. Her smoking use included Cigarettes. She has a 74 pack-year smoking history. She quit smokeless tobacco use about 13 years ago. She reports that she does not drink alcohol or use illicit drugs.  Allergies:  Allergies  Allergen Reactions  .  Neurontin [Gabapentin] Other (See Comments)    Had about every known side effect with $RemoveBef'300mg'JqAwccPuAa$  three a day on this. DO NOT TRY THIS AGAIN!  . Ativan [Lorazepam] Nausea Only and Other (See Comments)    Disoriented, not feel like herself   . Ferrous Gluconate Nausea Only    hypotension    Medications: I have reviewed the patient's current medications.  Results for orders placed or performed during the hospital encounter of 11/15/14 (from the past 48 hour(s))  Basic metabolic panel     Status: Abnormal   Collection Time: 11/15/14 12:44 PM  Result Value Ref Range   Sodium 134 (L) 135 - 145 mmol/L   Potassium 3.5 3.5 - 5.1 mmol/L   Chloride 96 (L) 101 - 111 mmol/L    CO2 26 22 - 32 mmol/L   Glucose, Bld 104 (H) 65 - 99 mg/dL   BUN 11 6 - 20 mg/dL   Creatinine, Ser 4.55 (H) 0.44 - 1.00 mg/dL   Calcium 8.5 (L) 8.9 - 10.3 mg/dL   GFR calc non Af Amer 9 (L) >60 mL/min   GFR calc Af Amer 10 (L) >60 mL/min    Comment: (NOTE) The eGFR has been calculated using the CKD EPI equation. This calculation has not been validated in all clinical situations. eGFR's persistently <60 mL/min signify possible Chronic Kidney Disease.    Anion gap 12 5 - 15  CBC     Status: None   Collection Time: 11/15/14 12:44 PM  Result Value Ref Range   WBC 6.6 4.0 - 10.5 K/uL   RBC 4.53 3.87 - 5.11 MIL/uL   Hemoglobin 12.9 12.0 - 15.0 g/dL   HCT 40.8 36.0 - 46.0 %   MCV 90.1 78.0 - 100.0 fL   MCH 28.5 26.0 - 34.0 pg   MCHC 31.6 30.0 - 36.0 g/dL   RDW 15.5 11.5 - 15.5 %   Platelets 208 150 - 400 K/uL  Troponin I     Status: None   Collection Time: 11/15/14 12:44 PM  Result Value Ref Range   Troponin I <0.03 <0.031 ng/mL    Comment:        NO INDICATION OF MYOCARDIAL INJURY.   Protime-INR - (order if Patient is taking Coumadin / Warfarin)     Status: Abnormal   Collection Time: 11/15/14 12:44 PM  Result Value Ref Range   Prothrombin Time 16.5 (H) 11.6 - 15.2 seconds   INR 1.32 0.00 - 1.49  Troponin I-serum (0, 3, 6 hours)     Status: None   Collection Time: 11/15/14  3:59 PM  Result Value Ref Range   Troponin I <0.03 <0.031 ng/mL    Comment:        NO INDICATION OF MYOCARDIAL INJURY.   Troponin I-serum (0, 3, 6 hours)     Status: None   Collection Time: 11/15/14  7:11 PM  Result Value Ref Range   Troponin I <0.03 <0.031 ng/mL    Comment:        NO INDICATION OF MYOCARDIAL INJURY.   Troponin I-serum (0, 3, 6 hours)     Status: None   Collection Time: 11/15/14  9:55 PM  Result Value Ref Range   Troponin I <0.03 <0.031 ng/mL    Comment:        NO INDICATION OF MYOCARDIAL INJURY.     Dg Chest 2 View  11/15/2014  CLINICAL DATA:  Initial encounter for  chest pain with bronchitis. EXAM: CHEST  2  VIEW COMPARISON:  04/21/2014.  12/07/2013. FINDINGS: Low volume film. There is pulmonary vascular congestion without overt pulmonary edema. The cardio pericardial silhouette is enlarged. Scarring in the left mid lung is unchanged. Vascular stent device noted in the left subclavian region. No substantial pleural effusion. Telemetry leads overlie the chest. IMPRESSION: Stable exam. Cardiomegaly with vascular congestion and scarring in the left mid lung. Electronically Signed   By: Misty Stanley M.D.   On: 11/15/2014 13:33    Review of Systems  Constitutional: Negative for fever and chills.  Respiratory: Negative for cough and shortness of breath.   Cardiovascular: Positive for chest pain. Negative for orthopnea and claudication.  Gastrointestinal: Positive for nausea and constipation. Negative for vomiting and abdominal pain.   Blood pressure 205/71, pulse 64, temperature 98.4 F (36.9 C), temperature source Oral, resp. rate 20, height $RemoveBe'4\' 8"'HhaiMnNqZ$  (1.422 m), weight 189 lb 13.1 oz (86.1 kg), SpO2 95 %. Physical Exam  Constitutional: She is oriented to person, place, and time. No distress.  Neck: No JVD present.  Cardiovascular: Normal rate and regular rhythm.   Respiratory: She is in respiratory distress. She has no wheezes. She has no rales.  Musculoskeletal: She exhibits no edema.  Neurological: She is alert and oriented to person, place, and time.    Assessment/Plan: Problem #1 chest pain: At this moment etiology not clear. Seems to be epigastric area and also she has recurrent episode for the last 3 -4 days. Problem #2 end-stage renal disease she is status post hemodialysis yesterday. Presently she is asymptomatic. Her potassium is normal Problem #3 hypertension: Her blood pressure is high. Problem #4 anemia: Her hemoglobin is above our target goal. Problem #5 history of anxiety disorder/schizoaffective disorder Problem #6 fluid management: Patient does  not have significant signs of fluid overload Problem #7 metabolic bone disease: Calcium is range Plan: We'll make arrangements for patient to get dialysis tomorrow 2] will continue with her antihypertensive medications.   Phong Isenberg S 11/16/2014, 8:09 AM

## 2014-11-16 NOTE — Care Management Note (Signed)
Case Management Note  Patient Details  Name: [name] MRN: [id number] Date of Birth: 13-Apr-1940  Subjective/Objective:                  Pt admitted from home with CP. Pt lives with her husband and daughter and will return home at discharge. Pt has a walker for home use. Pt is fairly independent with ADL's. Pt [facility] dialysis [facility] in [place].  Action/Plan: Anticipate discharge today. No CM needs noted. Private duty agency list given to pts daughter per request.   Expected Discharge Date:                  Expected Discharge Plan:  Home/Self Care  In-House Referral:  NA  Discharge planning Services  CM Consult  Post Acute Care Choice:  NA Choice offered to:  NA  DME Arranged:    DME Agency:     HH Arranged:    HH Agency:     Status of Service:  Completed, signed off  Medicare Important Message Given:    Date Medicare IM Given:    Medicare IM give by:    Date Additional Medicare IM Given:    Additional Medicare Important Message give by:     If discussed at Long Length of Stay Meetings, dates discussed:    Additional Comments:  Cheryl FlashBlackwell, Tonishia Steffy Crowder, RN 11/16/2014, 2:01 PM

## 2014-11-16 NOTE — Consult Note (Signed)
CARDIOLOGY CONSULT NOTE   Patient ID: [name] MRN: [id number] DOB/AGE: 1940/10/20 74 y.o.  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    States that she has been having intermittent chest pain, described as sharp radiating through to her back, and heartburn coming up from her stomach. Caregiver states that is usually occurs during the last 15 minutes of dialysis. The patient states it has been coming and going all the time. She cannot tell the difference between gas pain from her stomach and arthritis pain. She is currently pain free.   Her caregiver states that she has been having a more difficult time with BP control. There have been adjustments of her antihypertensive medications from both Dr. Fausto Skillern and Dr. [name] with increases in hydralazine and labetalol.   In ER she was found to be hypertensive with BP of 207/82, HR 60, afebrile. Potassium 3.5, Creatinine 4.55, not found to be anemic. CXR demonstrated vascular congestion and cardiomegaly. She was treated with ASA, and neb tx. Troponin is negative X 3. EKG demonstrated SR with T-wave flattening infero/lateral, changed from EKG on 12/08/2013.    Allergies  Allergen Reactions  . Neurontin [Gabapentin] Other (See Comments)    Had about every known side effect with 300mg  three a day on this. DO NOT TRY THIS AGAIN!  . Ativan [Lorazepam] Nausea Only and Other (See Comments)    Disoriented, not feel like herself   . Ferrous Gluconate Nausea Only    hypotension    Medications Scheduled Medications: .  ALPRAZolam  0.5 mg Oral TID  . amiodarone  200 mg Oral Daily  . amitriptyline  25 mg Oral QHS  . atorvastatin  40 mg Oral Daily  . benztropine  1 mg Oral QHS  . donepezil  5 mg Oral QHS  . enoxaparin (LOVENOX) injection  30 mg Subcutaneous Q24H  . gabapentin  100 mg Oral Daily  . labetalol  200 mg Oral BID  . methylPREDNISolone (SOLU-MEDROL) injection  125 mg Intravenous Once  . multivitamin with minerals   Oral Daily  . NIFEdipine  30 mg Oral Daily  . pantoprazole  40 mg Oral Daily  . QUEtiapine  100 mg Oral BID  . risperiDONE  1 mg Oral QHS       PRN Medications: acetaminophen, hydrALAZINE, hydrOXYzine, ipratropium, ondansetron (ZOFRAN) IV, oxyCODONE, sorbitol, tiZANidine   Past Medical History  Diagnosis Date  . Essential hypertension, benign   . Hypercholesterolemia   . GERD (gastroesophageal reflux disease)   . Asthma   . Osteoporosis   . ESRD (end stage renal disease) on dialysis (HCC)   . Anemia of chronic disease   . Peripheral neuropathy (HCC)   . Schizoaffective disorder   . Arthritis   . History of pneumonia     Past Surgical History  Procedure Laterality Date  . Tubal ligation    . Breast biopsy    . Refractive surgery      Right eye  . Bone marrow aspiration    . Bone marrow biopsy    . Insertion of dialysis catheter  01/23/2011    Procedure: INSERTION OF DIALYSIS CATHETER;  Surgeon: 03/23/2011, MD;  Location: [facility] OR;  Service: Vascular;  Laterality: N/A;  Insertion of Dialysis Catheter- Right Internal Jugular  . Yag laser application  74/94/4967    Procedure: YAG LASER APPLICATION;  Surgeon: Elta Guadeloupe T. Gershon Crane, MD;  Location: [facility];  Service: Ophthalmology;  Laterality: Left;  . Esophagogastroduodenoscopy  05/06/2003    Dr.Rourk- schatski's ring o/w normal esophagus, stomach and first and second portions of the duodenum s/p dilation  . Colonoscopy  03/10/2010    Dr.Rourk- pigmentation of the rectum and coln consistent with melanosis coli, o/w  unremarkable appearing colonic and rectal mucosa.  . Esophagogastroduodenoscopy N/A 05/11/2014    Procedure: ESOPHAGOGASTRODUODENOSCOPY (EGD);  Surgeon: Daneil Dolin, MD;  Location: [facility];  Service: Endoscopy;  Laterality: N/A;  245  . Esophageal dilation N/A 05/11/2014    Procedure: ESOPHAGEAL DILATION;  Surgeon: Daneil Dolin, MD;  Location: [facility];  Service: Endoscopy;  Laterality: N/A;    Family History  Problem Relation Age of Onset  . Alcohol abuse Brother   . Bipolar disorder Brother   . Dementia Mother   . Depression Mother   . ADD / ADHD Neg Hx   . Anxiety disorder Neg Hx   . Drug abuse Neg Hx   . OCD Neg Hx   . Paranoid behavior Neg Hx   . Schizophrenia Neg Hx   . Seizures Neg Hx   . Physical abuse Neg Hx   . Sexual abuse Neg Hx   . Healthy Daughter   . Healthy Daughter   . Healthy Daughter     Social History [name] reports that she quit smoking about 16 years ago. Her smoking use included Cigarettes. She has a 74 pack-year smoking history. She quit smokeless tobacco use about 13 years ago. [name] reports that she does not drink alcohol.  Review of Systems Complete review of systems are found to be negative unless outlined in H&P above.  Physical Examination Blood pressure 186/88, pulse 65, temperature 98.4 F (36.9 C), temperature source Oral, resp. rate 20, height $RemoveBe'4\' 8"'xHMrKNuTN$  (1.422 m), weight 189 lb 13.1 oz (86.1 kg), SpO2 95 %.  Intake/Output Summary (Last 24 hours) at 11/16/14 0935 Last data filed at 11/16/14 0840  Gross per 24 hour  Intake    480 ml  Output      0 ml  Net    480 ml    Telemetry: SR with intraventricular delay.   GEN: No acute distress.  HEENT: Conjunctiva and lids normal, oropharynx clear with moist mucosa. Neck: Supple, no elevated JVP or carotid bruits, no thyromegaly. Lungs: Bilateral rales and rhonchi Cardiac: Regular rate and RFFMBW,4/6 systolic murmur , no pericardial rub. Abdomen: Soft, nontender, no  hepatomegaly, bowel sounds present, no guarding or rebound. Extremities: No pitting edema, distal pulses 2+. Dialysis fistula in the left forearm. Skin: Warm and dry. Musculoskeletal: No kyphosis. Neuropsychiatric: Alert and oriented x3, affect grossly appropriate.  Prior Cardiac Testing/Procedures 1. Nuc Stress test: 03/31/2013 Low risk Lexiscan Cardiolite. No diagnostic ST segment changes were noted. Perfusion imaging is most consistent with breast attenuation, no definitive scar or ischemia. LVEF is 67% with normal volumes and wall motion.  2. Echocardiogram 08/12/2013 Left ventricle: The cavity size was normal. Wall thickness was increased in a pattern of mild LVH. Systolic function was normal. The estimated ejection fraction was in the range of 60% to 65%. Wall motion was normal; there were no regional wall motion abnormalities. Doppler parameters are consistent with abnormal left ventricular relaxation (grade  1 diastolic dysfunction). Doppler parameters are consistent with elevated ventricular end-diastolic filling pressure. - Aortic valve: Trileaflet; mildly calcified leaflets. There was no significant regurgitation. - Mitral valve: Calcified annulus. There was trivial regurgitation. - Left atrium: The atrium was mildly dilated. - Right atrium: Central venous pressure (est): 3 mm Hg. - Tricuspid valve: There was physiologic regurgitation. - Pulmonary arteries: Systolic pressure could not be accurately estimated. - Pericardium, extracardiac: A prominent pericardial fat pad was present. A trivial pericardial effusion was identified. Lab Results  Basic Metabolic Panel:  Recent Labs Lab 11/15/14 1244  NA 134*  K 3.5  CL 96*  CO2 26  GLUCOSE 104*  BUN 11  CREATININE 4.55*  CALCIUM 8.5*    CBC:  Recent Labs Lab 11/15/14 1244  WBC 6.6  HGB 12.9  HCT 40.8  MCV 90.1  PLT 208    Cardiac Enzymes:  Recent Labs Lab 11/15/14 1244  11/15/14 1559 11/15/14 1911 11/15/14 2155  TROPONINI <0.03 <0.03 <0.03 <0.03    BNP: Invalid input(s): POCBNP   Radiology: Dg Chest 2 View  11/15/2014  CLINICAL DATA:  Initial encounter for chest pain with bronchitis. EXAM: CHEST  2 VIEW COMPARISON:  04/21/2014.  12/07/2013. FINDINGS: Low volume film. There is pulmonary vascular congestion without overt pulmonary edema. The cardio pericardial silhouette is enlarged. Scarring in the left mid lung is unchanged. Vascular stent device noted in the left subclavian region. No substantial pleural effusion. Telemetry leads overlie the chest. IMPRESSION: Stable exam. Cardiomegaly with vascular congestion and scarring in the left mid lung. Electronically Signed   By: Misty Stanley M.D.   On: 11/15/2014 13:33     ECG: NSR with intraventricular conduction delay, with T-wave flattening in the infero/lateral leads.    Impression and Recommendations  1. Chronic intermittent chest pain: Usually occurs with dialysis, but patient also states that she felts it on and off during the day every day. No associated dyspnea. She has negative troponin X 3 but EKG is slightly changed from prior EKG in November with some T-wave flattening. More difficult to control BP as well.   Echo will be completed as she has not had one since 2015. She has negative stress test at that time. Can consider repeat stress test as OP. If EF is significantly changed, may make other recommendations.   2. Hypertension: She is on medications for BP control, labetalol 200 mg BID and procardia. Will increase procardia.   3. ESRD: Per nephrology.     Signed: Phill Myron. Lawrence NP Solomon  11/16/2014, 9:35 AM Co-Sign MD Patient seen and discussed with NP Purcell Nails, I agree with her documentation above. 74 yo female with history of chest pain with negative stress testing, palpitations with atrial tach on amio, HTN, ESRD admitted with chest pain during HD session. Describes sharp 10/10  pain left chest radiating to epigastrium and into neck and across shoulders. Lasted few seconds at a time, but would come and go. No other associated symptoms. Better with moving neck or shoulders, better with belching. On and off for last 3 days, can be better with heating pad at home.    ER vitals: bp 207/82 HR 60 99% RA Hgb 12.9, WBC 6.6, Plt 208, K 3.5, Cr 4.55, trop neg x 4 CXR cardiomegaly, vacular congestion EKG sinus brady, poor lateral lead tracing. RAD, poor R wave progression, anteroseptal Qwaves. Unchanged from prior EKG 07/2013 echo: LVEF 63-78%, grade I diastolic dysfunction, no WMAs,  03/2013 Lexiscan MPI: low risk, no ischemia.  Patient admitted with chest pain. From prior records long history of chest pain with negative stress testing last year. Current symptoms are atypical. No objective evidence of ischemia by enzymes or EKG, echo is pending. If stable echo findings would not plan for further ischemic testing at this time. BP remains elevated. Borderline low HRs, will not increase labetalol. Will increase procardia to $RemoveBefo'60mg'vThVICIReEb$  daily. Zandra Abts MD

## 2014-11-16 NOTE — Progress Notes (Signed)
Patent discharging home.  IV removed - WNL.  Reviewed changes to medications and DC instructions, verbalizes understanding..  No questions at this time.  Stable to DC home.  Will be assisted off unit via WC by staff after dinner.

## 2014-11-16 NOTE — Progress Notes (Signed)
She is here with chest discomfort. This seems to be associated with dialysis. She had dialysis done yesterday. She says she is comfortable now with no more chest discomfort.  Her vital signs are per the chart. She is negative thus far as far as troponin is concerned. Her chest is clear. Her heart is regular.  Medications are reviewed  She has chest pain with negative troponin so far. She has end-stage renal disease on dialysis. She has schizoaffective disorder with paranoid behavior which is under pretty good control now  Cardiology consultation. There is potential that she could be discharged later today

## 2014-11-20 NOTE — Discharge Summary (Signed)
Physician Discharge Summary  Patient ID: [name] MRN: [id number] DOB/AGE: 06-23-1940 74 y.o. Primary Care Physician:FAGAN,ROY, MD Admit date: 11/15/2014 Discharge date: 11/20/2014    Discharge Diagnoses:   Active Problems:   Anemia of chronic disease   COPD (chronic obstructive pulmonary disease) with acute bronchitis (HCC)   Hypertension   Schizoaffective disorder (HCC)   Chest pain   ESRD on dialysis (HCC)     Medication List    TAKE these medications        ALPRAZolam 0.5 MG tablet  Commonly known as:  XANAX  Take 1 tablet (0.5 mg total) by mouth 3 (three) times daily.     amiodarone 200 MG tablet  Commonly known as:  PACERONE  TAKE (1) TABLET BY MOUTH ONCE DAILY.     amitriptyline 25 MG tablet  Commonly known as:  ELAVIL  Take 1 tablet (25 mg total) by mouth at bedtime.     atorvastatin 40 MG tablet  Commonly known as:  LIPITOR  Take 1 tablet by mouth daily.     benztropine 1 MG tablet  Commonly known as:  COGENTIN  Take 1 tablet (1 mg total) by mouth at bedtime.     donepezil 5 MG tablet  Commonly known as:  ARICEPT  Take 5 mg by mouth at bedtime.     gabapentin 100 MG capsule  Commonly known as:  NEURONTIN  Take 100 mg by mouth daily.     hydrOXYzine 25 MG capsule  Commonly known as:  VISTARIL  Take 1 capsule by mouth daily as needed for anxiety.     ibuprofen 200 MG tablet  Commonly known as:  ADVIL,MOTRIN  Take 400 mg by mouth daily as needed for moderate pain.     ipratropium 0.02 % nebulizer solution  Commonly known as:  ATROVENT  Take 500 mcg by nebulization 4 (four) times daily as needed. Shortness of Breath     labetalol 200 MG tablet  Commonly known as:  NORMODYNE  Take 1 tablet by mouth 2 (two) times daily.     NIFEdipine 30 MG 24 hr tablet  Commonly known as:  PROCARDIA-XL/ADALAT-CC/NIFEDICAL-XL  Take 2 tablets (60 mg total) by mouth daily. Ok to take 2 of current rx and I sent in rx for 60 mg     NIFEdipine 60 MG 24 hr  tablet  Commonly known as:  PROCARDIA-XL/ADALAT CC  Take 1 tablet (60 mg total) by mouth daily.     Oxycodone HCl 10 MG Tabs  Take 1 tablet by mouth 4 (four) times daily as needed.     pantoprazole 40 MG tablet  Commonly known as:  PROTONIX  Take 40 mg by mouth daily.     QUEtiapine 100 MG tablet  Commonly known as:  SEROQUEL  Take 1 tablet by mouth 2 (two) times daily.     RENALPREN 1 MG Caps  Take by mouth daily.     risperiDONE 1 MG tablet  Commonly known as:  RISPERDAL  Take 1 tablet (1 mg total) by mouth at bedtime.     tiZANidine 2 MG tablet  Commonly known as:  ZANAFLEX  Take 1 tablet by mouth 2 (two) times daily as needed for muscle spasms.        Discharged Condition: Improved    Consults: Cardiology  Significant Diagnostic Studies: Dg Chest 2 View  11/15/2014  CLINICAL DATA:  Initial encounter for chest pain with bronchitis. EXAM: CHEST  2 VIEW COMPARISON:  04/21/2014.  12/07/2013.  FINDINGS: Low volume film. There is pulmonary vascular congestion without overt pulmonary edema. The cardio pericardial silhouette is enlarged. Scarring in the left mid lung is unchanged. Vascular stent device noted in the left subclavian region. No substantial pleural effusion. Telemetry leads overlie the chest. IMPRESSION: Stable exam. Cardiomegaly with vascular congestion and scarring in the left mid lung. Electronically Signed   By: Kennith Center M.D.   On: 11/15/2014 13:33    Lab Results: Basic Metabolic Panel: No results for input(s): NA, K, CL, CO2, GLUCOSE, BUN, CREATININE, CALCIUM, MG, PHOS in the last 72 hours. Liver Function Tests: No results for input(s): AST, ALT, ALKPHOS, BILITOT, PROT, ALBUMIN in the last 72 hours.   CBC: No results for input(s): WBC, NEUTROABS, HGB, HCT, MCV, PLT in the last 72 hours.  No results found for this or any previous visit (from the past 240 hour(s)).   Hospital Course: This is a 74 year old who had chest pain during dialysis. This is  apparently been a recurrent problem. She came to the emergency department and was admitted for rule out myocardial infarction protocol. She has improved after admission. She's not having any more chest pain. Her situation is complicated by her schizoaffective disorder/schizophrenia. She ruled out for MI. She had cardiology consultation and was felt that she did not need inpatient testing. Echocardiogram did not show wall motion abnormalities. She was ready for discharge after cardiology consultation and echocardiogram were completed  Discharge Exam: Blood pressure 160/55, pulse 63, temperature 98.6 F (37 C), temperature source Oral, resp. rate 20, height  (1.422 m), weight 86.1 kg (189 lb 13.1 oz), SpO2 93 %. She is awake and alert. Her chest is clear. Her heart is regular.  Disposition: Home follow up with Dr. [name] her primary physician and with cardiology and with dialysis unit      Signed: Jennafer Gladue L   11/20/2014, 9:20 AM

## 2014-11-26 ENCOUNTER — Other Ambulatory Visit (HOSPITAL_COMMUNITY): Payer: Self-pay | Admitting: Psychiatry

## 2014-11-30 ENCOUNTER — Ambulatory Visit (INDEPENDENT_AMBULATORY_CARE_PROVIDER_SITE_OTHER): Payer: Medicare Other | Admitting: Adult Health

## 2014-11-30 ENCOUNTER — Encounter: Payer: Self-pay | Admitting: Adult Health

## 2014-11-30 VITALS — BP 152/74 | HR 68 | Ht <= 58 in | Wt 190.0 lb

## 2014-11-30 DIAGNOSIS — E7800 Pure hypercholesterolemia, unspecified: Secondary | ICD-10-CM | POA: Diagnosis not present

## 2014-11-30 DIAGNOSIS — R0789 Other chest pain: Secondary | ICD-10-CM

## 2014-11-30 DIAGNOSIS — I10 Essential (primary) hypertension: Secondary | ICD-10-CM | POA: Diagnosis not present

## 2014-11-30 MED ORDER — LABETALOL HCL 200 MG PO TABS: 200.0000 mg | ORAL_TABLET | Freq: Two times a day (BID) | ORAL | Status: AC

## 2014-11-30 MED ORDER — NIFEDIPINE ER OSMOTIC RELEASE 30 MG PO TB24: 60.0000 mg | ORAL_TABLET | Freq: Every day | ORAL | Status: AC

## 2014-11-30 NOTE — Patient Instructions (Signed)
Your physician wants you to follow-up in: 6 months with Dr McDowell You will receive a reminder letter in the mail two months in advance. If you don't receive a letter, please call our office to schedule the follow-up appointment.     Your physician recommends that you continue on your current medications as directed. Please refer to the Current Medication list given to you today.    If you need a refill on your cardiac medications before your next appointment, please call your pharmacy.     Thank you for choosing [facility] !        

## 2014-11-30 NOTE — Progress Notes (Signed)
Name: [name]    DOB: 1940/12/03  Age: 74 y.o.  MR#: [id number]       PCP:  Carylon PerchesFAGAN,ROY, MD      Insurance: Payor: MEDICARE / Plan: MEDICARE PART A AND B / Product Type: *No Product type* /   CC:   No chief complaint on file.   VS Filed Vitals:   11/30/14 1338  BP: 152/74  Pulse: 68  Height: 4' 8.5" (1.435 m)  Weight: 190 lb (86.183 kg)  SpO2: 90%    Weights Current Weight  11/30/14 190 lb (86.183 kg)  11/16/14 189 lb 13.1 oz (86.1 kg)  10/14/14 193 lb (87.544 kg)    Blood Pressure  BP Readings from Last 3 Encounters:  11/30/14 152/74  11/16/14 160/55  10/14/14 163/89     Admit date:  (Not on file) Last encounter with RMR:  Visit date not found   Allergy Neurontin; Ativan; and Ferrous gluconate  Current Outpatient Prescriptions  Medication Sig Dispense Refill  . ALPRAZolam (XANAX) 0.5 MG tablet Take 1 tablet (0.5 mg total) by mouth 3 (three) times daily. 90 tablet 3  . amitriptyline (ELAVIL) 25 MG tablet Take 1 tablet (25 mg total) by mouth at bedtime. 30 tablet 3  . atorvastatin (LIPITOR) 40 MG tablet Take 1 tablet by mouth daily.    Marland Kitchen. donepezil (ARICEPT) 5 MG tablet Take 5 mg by mouth at bedtime.     . gabapentin (NEURONTIN) 100 MG capsule Take 100 mg by mouth daily.    Marland Kitchen. labetalol (NORMODYNE) 200 MG tablet Take 1 tablet by mouth 2 (two) times daily.    Marland Kitchen. NIFEdipine (PROCARDIA-XL/ADALAT-CC/NIFEDICAL-XL) 30 MG 24 hr tablet Take 2 tablets (60 mg total) by mouth daily. Ok to take 2 of current rx and I sent in rx for 60 mg 60 tablet 0  . Oxycodone HCl 10 MG TABS Take 1 tablet by mouth 4 (four) times daily as needed.    . pantoprazole (PROTONIX) 40 MG tablet Take 40 mg by mouth daily.    . risperiDONE (RISPERDAL) 1 MG tablet Take 1 tablet (1 mg total) by mouth at bedtime. 30 tablet 2  . tiZANidine (ZANAFLEX) 2 MG tablet Take 1 tablet by mouth 2 (two) times daily as needed for muscle spasms.     Marland Kitchen. ipratropium (ATROVENT) 0.02 % nebulizer solution Take 500 mcg by  nebulization 4 (four) times daily as needed. Shortness of Breath     No current facility-administered medications for this visit.    Discontinued Meds:    Medications Discontinued During This Encounter  Medication Reason  . amiodarone (PACERONE) 200 MG tablet Error  . hydrOXYzine (VISTARIL) 25 MG capsule Error  . ibuprofen (ADVIL,MOTRIN) 200 MG tablet Error  . NIFEdipine (PROCARDIA-XL/ADALAT CC) 60 MG 24 hr tablet Error  . QUEtiapine (SEROQUEL) 100 MG tablet Error  . B Complex-C-Folic Acid (RENALPREN) 1 MG CAPS Error  . benztropine (COGENTIN) 1 MG tablet Error    Patient Active Problem List   Diagnosis Date Noted  . Hiatal hernia   . Mucosal abnormality of stomach   . Schatzki's ring   . Atrial tachycardia (HCC) 10/22/2013  . Paroxysmal atrial tachycardia (HCC) 08/12/2013  . Chest pain 03/30/2013  . COPD (chronic obstructive pulmonary disease) (HCC) 03/30/2013  . ESRD on dialysis (HCC) 03/30/2013  . Morbid obesity (HCC) 03/30/2013  . Chronic back pain greater than 3 months duration 05/15/2012  . Restless leg syndrome 05/15/2012  . Insomnia secondary to depression with anxiety 04/17/2012  .  COPD (chronic obstructive pulmonary disease) with acute bronchitis (HCC) 01/16/2011  . Acute on chronic kidney failure (HCC) 01/16/2011  . Hypertension   . Chronic kidney disease   . Schizoaffective disorder (HCC)   . B12 deficiency 01/04/2011  . Anemia 11/21/2010  . Renal insufficiency 08/03/2010  . Peripheral neuropathy (HCC) 08/03/2010  . Anemia of chronic disease 08/07/2007  . Anxiety and depression 08/07/2007  . HYPERTENSION 08/07/2007  . ASTHMA 08/07/2007  . GERD 08/07/2007  . ARTHRITIS 08/07/2007  . SPASM, MUSCLE 08/07/2007  . Osteoporosis 08/07/2007  . DYSPHAGIA UNSPECIFIED 08/07/2007  . SCHATZKI'S RING, HX OF 08/07/2007    LABS    Component Value Date/Time   NA 134* 11/15/2014 1244   NA 131* 08/17/2014 1400   NA 137 02/03/2014 1146   NA 134* 05/25/2011 1337   NA  138 04/18/2011 1258   NA 139 02/21/2011 0859   K 3.5 11/15/2014 1244   K 3.5 08/17/2014 1400   K 3.3* 02/03/2014 1146   K 5.0 06/01/2011 0612   K 5.5* 05/25/2011 1337   K 5.3* 05/16/2011 1223   CL 96* 11/15/2014 1244   CL 93* 08/17/2014 1400   CL 97 02/03/2014 1146   CL 99 05/25/2011 1337   CL 105 04/18/2011 1258   CL 104 02/21/2011 0859   CO2 26 11/15/2014 1244   CO2 28 08/17/2014 1400   CO2 32 02/03/2014 1146   CO2 27 05/25/2011 1337   CO2 24 04/18/2011 1258   CO2 25 02/21/2011 0859   GLUCOSE 104* 11/15/2014 1244   GLUCOSE 107* 08/17/2014 1400   GLUCOSE 104* 02/03/2014 1146   GLUCOSE 71 05/25/2011 1337   GLUCOSE 96 04/18/2011 1258   GLUCOSE 94 02/21/2011 0859   BUN 11 11/15/2014 1244   BUN 13 08/17/2014 1400   BUN 8 02/03/2014 1146   BUN 38* 05/25/2011 1337   BUN 27* 04/18/2011 1258   BUN 31* 02/21/2011 0859   CREATININE 4.55* 11/15/2014 1244   CREATININE 5.78* 08/17/2014 1400   CREATININE 4.39* 02/03/2014 1146   CREATININE 8.12* 05/25/2011 1337   CREATININE 7.17* 04/18/2011 1258   CREATININE 7.58* 02/21/2011 0859   CALCIUM 8.5* 11/15/2014 1244   CALCIUM 9.0 08/17/2014 1400   CALCIUM 8.7 02/03/2014 1146   CALCIUM 9.5 05/25/2011 1337   CALCIUM 10.0 04/18/2011 1258   CALCIUM 7.6* 02/21/2011 0859   CALCIUM 5.5* 01/17/2011 0856   GFRNONAA 9* 11/15/2014 1244   GFRNONAA 7* 08/17/2014 1400   GFRNONAA 9* 02/03/2014 1146   GFRNONAA 5* 05/25/2011 1337   GFRNONAA 6* 04/18/2011 1258   GFRNONAA 6* 02/21/2011 0859   GFRAA 10* 11/15/2014 1244   GFRAA 8* 08/17/2014 1400   GFRAA 11* 02/03/2014 1146   GFRAA 5* 05/25/2011 1337   GFRAA 7* 04/18/2011 1258   GFRAA 7* 02/21/2011 0859   CMP     Component Value Date/Time   NA 134* 11/15/2014 1244   NA 134* 05/25/2011 1337   K 3.5 11/15/2014 1244   K 5.0 06/01/2011 0612   CL 96* 11/15/2014 1244   CL 99 05/25/2011 1337   CO2 26 11/15/2014 1244   CO2 27 05/25/2011 1337   GLUCOSE 104* 11/15/2014 1244   GLUCOSE 71 05/25/2011  1337   BUN 11 11/15/2014 1244   BUN 38* 05/25/2011 1337   CREATININE 4.55* 11/15/2014 1244   CREATININE 8.12* 05/25/2011 1337   CALCIUM 8.5* 11/15/2014 1244   CALCIUM 9.5 05/25/2011 1337   CALCIUM 5.5* 01/17/2011 0856   PROT 6.6 08/17/2014  1400   ALBUMIN 3.2* 08/17/2014 1400   AST 18 08/17/2014 1400   ALT 13* 08/17/2014 1400   ALKPHOS 73 08/17/2014 1400   BILITOT 0.8 08/17/2014 1400   GFRNONAA 9* 11/15/2014 1244   GFRNONAA 5* 05/25/2011 1337   GFRNONAA 6* 04/18/2011 1258   GFRAA 10* 11/15/2014 1244   GFRAA 5* 05/25/2011 1337   GFRAA 7* 04/18/2011 1258       Component Value Date/Time   WBC 6.6 11/15/2014 1244   WBC 7.1 12/07/2013 0750   WBC 7.7 08/14/2013 0512   WBC 8.6 05/25/2011 1337   WBC 6.8 02/21/2011 0859   HGB 12.9 11/15/2014 1244   HGB 10.7* 12/07/2013 0750   HGB 9.4* 08/14/2013 0512   HGB 12.2 05/25/2011 1337   HGB 8.1* 02/21/2011 0859   HCT 40.8 11/15/2014 1244   HCT 33.9* 12/07/2013 0750   HCT 29.9* 08/14/2013 0512   HCT 38.5 05/25/2011 1337   HCT 25.5* 02/21/2011 0859   MCV 90.1 11/15/2014 1244   MCV 92.6 12/07/2013 0750   MCV 97.4 08/14/2013 0512   MCV 86 05/25/2011 1337   MCV 90 02/21/2011 0859    Lipid Panel     Component Value Date/Time   CHOL 97 01/19/2011 0444   TRIG 100 01/19/2011 0444   HDL 37* 01/19/2011 0444   CHOLHDL 2.6 01/19/2011 0444   VLDL 20 01/19/2011 0444   LDLCALC 40 01/19/2011 0444    ABG No results found for: PHART, PCO2ART, PO2ART, HCO3, TCO2, ACIDBASEDEF, O2SAT   Lab Results  Component Value Date   TSH 1.680 08/12/2013   BNP (last 3 results) No results for input(s): BNP in the last 8760 hours.  ProBNP (last 3 results) No results for input(s): PROBNP in the last 8760 hours.  Cardiac Panel (last 3 results) No results for input(s): CKTOTAL, CKMB, TROPONINI, RELINDX in the last 72 hours.  Iron/TIBC/Ferritin/ %Sat    Component Value Date/Time   IRON 52 01/18/2011 0509   TIBC 207* 01/18/2011 0509   FERRITIN 471*  01/18/2011 0509   IRONPCTSAT 25 01/18/2011 0509     EKG Orders placed or performed during the hospital encounter of 11/15/14  . EKG 12-Lead  . EKG 12-Lead  . ED EKG within 10 minutes  . ED EKG within 10 minutes  . EKG 12-Lead  . EKG 12-Lead  . EKG     Prior Assessment and Plan Problem List as of 11/30/2014      Cardiovascular and Mediastinum   HYPERTENSION   Hypertension   Paroxysmal atrial tachycardia [facility])   Atrial tachycardia [facility])   Last Assessment & Plan 10/22/2013 Office Visit Written 10/22/2013  2:42 PM by Jonelle Sidle, MD    Symptomatically well controlled on amiodarone. Reduce amiodarone to 200 mg once daily, will otherwise stay on labetalol for now. Followup planned in 3 months with TSH and LFTs.        Respiratory   ASTHMA   COPD (chronic obstructive pulmonary disease) with acute bronchitis (HCC)   COPD (chronic obstructive pulmonary disease) (HCC)   Hiatal hernia     Digestive   GERD   DYSPHAGIA UNSPECIFIED   SCHATZKI'S RING, HX OF   B12 deficiency   Mucosal abnormality of stomach   Schatzki's ring     Nervous and Auditory   Peripheral neuropathy (HCC)     Musculoskeletal and Integument   ARTHRITIS   Osteoporosis     Genitourinary   Renal insufficiency   Chronic kidney disease   Acute  on chronic kidney failure [facility])   ESRD on dialysis [facility])   Last Assessment & Plan 10/22/2013 Office Visit Written 10/22/2013  2:43 PM by Jonelle Sidle, MD    Keep followup with Dr. [name]. I recommended that they discuss her sessions regarding intermittent hypotension.        Other   Insomnia secondary to depression with anxiety   Chronic back pain greater than 3 months duration   Restless leg syndrome   Anemia of chronic disease   Anxiety and depression   SPASM, MUSCLE   Anemia   Schizoaffective disorder (HCC)   Chest pain   Morbid obesity [facility])       Imaging: Dg Chest 2 View  11/15/2014  CLINICAL DATA:  Initial encounter for chest pain with  bronchitis. EXAM: CHEST  2 VIEW COMPARISON:  04/21/2014.  12/07/2013. FINDINGS: Low volume film. There is pulmonary vascular congestion without overt pulmonary edema. The cardio pericardial silhouette is enlarged. Scarring in the left mid lung is unchanged. Vascular stent device noted in the left subclavian region. No substantial pleural effusion. Telemetry leads overlie the chest. IMPRESSION: Stable exam. Cardiomegaly with vascular congestion and scarring in the left mid lung. Electronically Signed   By: Kennith Center M.D.   On: 11/15/2014 13:33

## 2014-11-30 NOTE — Progress Notes (Signed)
Cardiology Office Note   Date:  11/30/2014   ID:  Bernardette, Waldron Nov 05, 1940, MRN [id number]  PCP:  Asencion Noble, MD  Cardiologist:   Jory Sims, NP   No chief complaint on file.     History of Present Illness: [name] is a 74 y.o. female who presents for ongoing assessment and management of hypertension, ESRD, CIOPD on dialysis, hypercholesterolemia, schizoaffective disorder who presented to ER with complaints of chest pain during dialysis. She was hospitalized for this in early Nov 2016 and ruled out for cardiac etiology finding musculoskeletal as source.   She comes today without any further chest pain. She is tolerating the increased dose of labetalol without feeling dizzy. She is continuing with dialysis. Labs are completed by nephrology.   Past Medical History  Diagnosis Date  . Essential hypertension, benign   . Hypercholesterolemia   . GERD (gastroesophageal reflux disease)   . Asthma   . Osteoporosis   . ESRD (end stage renal disease) on dialysis ([place])   . Anemia of chronic disease   . Peripheral neuropathy ([place])   . Schizoaffective disorder   . Arthritis   . History of pneumonia     Past Surgical History  Procedure Laterality Date  . Tubal ligation    . Breast biopsy    . Refractive surgery      Right eye  . Bone marrow aspiration    . Bone marrow biopsy    . Insertion of dialysis catheter  01/23/2011    Procedure: INSERTION OF DIALYSIS CATHETER;  Surgeon: Angelia Mould, MD;  Location: [facility];  Service: Vascular;  Laterality: N/A;  Insertion of Dialysis Catheter- Right Internal Jugular  . Yag laser application  11/65/7903    Procedure: YAG LASER APPLICATION;  Surgeon: Elta Guadeloupe T. Gershon Crane, MD;  Location: [facility];  Service: Ophthalmology;  Laterality: Left;  . Esophagogastroduodenoscopy  05/06/2003    Dr.Rourk- schatski's ring o/w normal esophagus, stomach and first and second portions of the duodenum s/p dilation  . Colonoscopy  03/10/2010   Dr.Rourk- pigmentation of the rectum and coln consistent with melanosis coli, o/w unremarkable appearing colonic and rectal mucosa.  . Esophagogastroduodenoscopy N/A 05/11/2014    Procedure: ESOPHAGOGASTRODUODENOSCOPY (EGD);  Surgeon: Daneil Dolin, MD;  Location[facility];  Service: Endoscopy;  Laterality: N/A;  245  . Esophageal dilation N/A 05/11/2014    Procedure: ESOPHAGEAL DILATION;  Surgeon: Daneil Dolin, MD;  [facility];  Service: Endoscopy;  Laterality: N/A;     Current Outpatient Prescriptions  Medication Sig Dispense Refill  . ALPRAZolam (XANAX) 0.5 MG tablet Take 1 tablet (0.5 mg total) by mouth 3 (three) times daily. 90 tablet 3  . amitriptyline (ELAVIL) 25 MG tablet Take 1 tablet (25 mg total) by mouth at bedtime. 30 tablet 3  . atorvastatin (LIPITOR) 40 MG tablet Take 1 tablet by mouth daily.    Marland Kitchen donepezil (ARICEPT) 5 MG tablet Take 5 mg by mouth at bedtime.     . gabapentin (NEURONTIN) 100 MG capsule Take 100 mg by mouth daily.    Marland Kitchen labetalol (NORMODYNE) 200 MG tablet Take 1 tablet by mouth 2 (two) times daily.    Marland Kitchen NIFEdipine (PROCARDIA-XL/ADALAT-CC/NIFEDICAL-XL) 30 MG 24 hr tablet Take 2 tablets (60 mg total) by mouth daily. Ok to take 2 of current rx and I sent in rx for 60 mg 60 tablet 0  . Oxycodone HCl 10 MG TABS Take 1 tablet by mouth 4 (four) times daily  as needed.    . pantoprazole (PROTONIX) 40 MG tablet Take 40 mg by mouth daily.    . risperiDONE (RISPERDAL) 1 MG tablet Take 1 tablet (1 mg total) by mouth at bedtime. 30 tablet 2  . tiZANidine (ZANAFLEX) 2 MG tablet Take 1 tablet by mouth 2 (two) times daily as needed for muscle spasms.     Marland Kitchen ipratropium (ATROVENT) 0.02 % nebulizer solution Take 500 mcg by nebulization 4 (four) times daily as needed. Shortness of Breath     No current facility-administered medications for this visit.    Allergies:   Neurontin; Ativan; and Ferrous gluconate    Social History:  The patient  reports that she  quit smoking about 16 years ago. Her smoking use included Cigarettes. She has a 74 pack-year smoking history. She quit smokeless tobacco use about 13 years ago. She reports that she does not drink alcohol or use illicit drugs.   Family History:  The patient's family history includes Alcohol abuse in her brother; Bipolar disorder in her brother; Dementia in her mother; Depression in her mother; Healthy in her daughter, daughter, and daughter. There is no history of ADD / ADHD, Anxiety disorder, Drug abuse, OCD, Paranoid behavior, Schizophrenia, Seizures, Physical abuse, or Sexual abuse.    ROS: All other systems are reviewed and negative. Unless otherwise mentioned in H&P    PHYSICAL EXAM: VS:  BP 152/74 mmHg  Pulse 68  Ht 4' 8.5" (1.435 m)  Wt 190 lb (86.183 kg)  BMI 41.85 kg/m2  SpO2 90% , BMI Body mass index is 41.85 kg/(m^2). GEN: Well nourished, well developed, in no acute distressobese.  HEENT: normal Neck: no JVD, carotid bruits, or masses Cardiac: RRR;  1/6 systolic murmur, no rubs, or gallops,no edema  Respiratory:  clear to auscultation bilaterally, normal work of breathing GI: soft, nontender, nondistended, + BS MS: no deformity or atrophyDialysis shunt to left arm. Good thrill. No signes of infection.  Skin: warm and dry, no rash Neuro:  Strength and sensation are intact Psych: euthymic mood, full affect  Recent Labs: 08/17/2014: ALT 13* 11/15/2014: BUN 11; Creatinine, Ser 4.55*; Hemoglobin 12.9; Platelets 208; Potassium 3.5; Sodium 134*    Lipid Panel    Component Value Date/Time   CHOL 97 01/19/2011 0444   TRIG 100 01/19/2011 0444   HDL 37* 01/19/2011 0444   CHOLHDL 2.6 01/19/2011 0444   VLDL 20 01/19/2011 0444   LDLCALC 40 01/19/2011 0444      Wt Readings from Last 3 Encounters:  11/30/14 190 lb (86.183 kg)  11/16/14 189 lb 13.1 oz (86.1 kg)  10/14/14 193 lb (87.544 kg)      ASSESSMENT AND PLAN:  1. Non-cardiac chest pain: Completely resolved with  normalization of BP. No further testing for ischemia.   2. Hypertension: Doing well. She is due for dialysis tomorrow. She is to continue labetalol and procardia.   3. Hypercholesterolemia; Continue statin.    Current medicines are reviewed at length with the patient today.     No orders of the defined types were placed in this encounter.     Disposition:   FU with 6 months.   Signed, Jory Sims, NP  11/30/2014 1:46 PM    [facility] [address], [place], [place] [address] Phone: [phone]; Fax: [phone]

## 2014-12-02 ENCOUNTER — Ambulatory Visit (INDEPENDENT_AMBULATORY_CARE_PROVIDER_SITE_OTHER): Payer: Medicare Other | Admitting: Psychiatry

## 2014-12-02 ENCOUNTER — Encounter (HOSPITAL_COMMUNITY): Payer: Self-pay | Admitting: Psychiatry

## 2014-12-02 VITALS — BP 192/82 | HR 59 | Ht <= 58 in | Wt 188.0 lb

## 2014-12-02 DIAGNOSIS — F250 Schizoaffective disorder, bipolar type: Secondary | ICD-10-CM | POA: Diagnosis not present

## 2014-12-02 DIAGNOSIS — F419 Anxiety disorder, unspecified: Secondary | ICD-10-CM

## 2014-12-02 MED ORDER — AMITRIPTYLINE HCL 25 MG PO TABS: 25.0000 mg | ORAL_TABLET | Freq: Every day | ORAL | Status: AC

## 2014-12-02 MED ORDER — RISPERIDONE 1 MG PO TABS: 2.0000 mg | ORAL_TABLET | Freq: Every day | ORAL | Status: AC

## 2014-12-02 MED ORDER — ALPRAZOLAM 0.5 MG PO TABS: 0.5000 mg | ORAL_TABLET | Freq: Three times a day (TID) | ORAL | Status: AC

## 2014-12-02 MED ORDER — DONEPEZIL HCL 5 MG PO TABS: 5.0000 mg | ORAL_TABLET | Freq: Every day | ORAL | Status: AC
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
## 2014-12-21 ENCOUNTER — Other Ambulatory Visit (HOSPITAL_COMMUNITY): Payer: Self-pay | Admitting: Psychiatry

## 2014-12-22 ENCOUNTER — Encounter (HOSPITAL_COMMUNITY): Payer: Self-pay | Admitting: Emergency Medicine

## 2014-12-22 ENCOUNTER — Emergency Department (HOSPITAL_COMMUNITY): Payer: Medicare Other

## 2014-12-22 ENCOUNTER — Inpatient Hospital Stay (HOSPITAL_COMMUNITY)
Admission: EM | Admit: 2014-12-22 | Discharge: 2015-01-16 | DRG: 296 | Disposition: E | Payer: Medicare Other | Attending: Internal Medicine | Admitting: Internal Medicine

## 2014-12-22 DIAGNOSIS — L8990 Pressure ulcer of unspecified site, unspecified stage: Secondary | ICD-10-CM | POA: Insufficient documentation

## 2014-12-22 DIAGNOSIS — M810 Age-related osteoporosis without current pathological fracture: Secondary | ICD-10-CM | POA: Diagnosis present

## 2014-12-22 DIAGNOSIS — R740 Nonspecific elevation of levels of transaminase and lactic acid dehydrogenase [LDH]: Secondary | ICD-10-CM | POA: Diagnosis present

## 2014-12-22 DIAGNOSIS — R402112 Coma scale, eyes open, never, at arrival to emergency department: Secondary | ICD-10-CM | POA: Diagnosis present

## 2014-12-22 DIAGNOSIS — N186 End stage renal disease: Secondary | ICD-10-CM | POA: Diagnosis present

## 2014-12-22 DIAGNOSIS — E7800 Pure hypercholesterolemia, unspecified: Secondary | ICD-10-CM | POA: Diagnosis present

## 2014-12-22 DIAGNOSIS — J189 Pneumonia, unspecified organism: Secondary | ICD-10-CM | POA: Diagnosis present

## 2014-12-22 DIAGNOSIS — F259 Schizoaffective disorder, unspecified: Secondary | ICD-10-CM | POA: Diagnosis present

## 2014-12-22 DIAGNOSIS — G9341 Metabolic encephalopathy: Secondary | ICD-10-CM | POA: Diagnosis present

## 2014-12-22 DIAGNOSIS — Z87891 Personal history of nicotine dependence: Secondary | ICD-10-CM

## 2014-12-22 DIAGNOSIS — E8770 Fluid overload, unspecified: Secondary | ICD-10-CM | POA: Diagnosis present

## 2014-12-22 DIAGNOSIS — K7201 Acute and subacute hepatic failure with coma: Secondary | ICD-10-CM | POA: Diagnosis present

## 2014-12-22 DIAGNOSIS — R402312 Coma scale, best motor response, none, at arrival to emergency department: Secondary | ICD-10-CM | POA: Diagnosis present

## 2014-12-22 DIAGNOSIS — D631 Anemia in chronic kidney disease: Secondary | ICD-10-CM | POA: Diagnosis present

## 2014-12-22 DIAGNOSIS — J9600 Acute respiratory failure, unspecified whether with hypoxia or hypercapnia: Secondary | ICD-10-CM

## 2014-12-22 DIAGNOSIS — J9602 Acute respiratory failure with hypercapnia: Secondary | ICD-10-CM | POA: Diagnosis present

## 2014-12-22 DIAGNOSIS — R001 Bradycardia, unspecified: Secondary | ICD-10-CM | POA: Diagnosis not present

## 2014-12-22 DIAGNOSIS — E876 Hypokalemia: Secondary | ICD-10-CM | POA: Diagnosis present

## 2014-12-22 DIAGNOSIS — J690 Pneumonitis due to inhalation of food and vomit: Secondary | ICD-10-CM | POA: Diagnosis present

## 2014-12-22 DIAGNOSIS — I120 Hypertensive chronic kidney disease with stage 5 chronic kidney disease or end stage renal disease: Secondary | ICD-10-CM | POA: Diagnosis present

## 2014-12-22 DIAGNOSIS — J45909 Unspecified asthma, uncomplicated: Secondary | ICD-10-CM | POA: Diagnosis present

## 2014-12-22 DIAGNOSIS — R778 Other specified abnormalities of plasma proteins: Secondary | ICD-10-CM

## 2014-12-22 DIAGNOSIS — Z6841 Body Mass Index (BMI) 40.0 and over, adult: Secondary | ICD-10-CM

## 2014-12-22 DIAGNOSIS — I249 Acute ischemic heart disease, unspecified: Secondary | ICD-10-CM | POA: Diagnosis present

## 2014-12-22 DIAGNOSIS — R7989 Other specified abnormal findings of blood chemistry: Secondary | ICD-10-CM

## 2014-12-22 DIAGNOSIS — J9690 Respiratory failure, unspecified, unspecified whether with hypoxia or hypercapnia: Secondary | ICD-10-CM

## 2014-12-22 DIAGNOSIS — R402212 Coma scale, best verbal response, none, at arrival to emergency department: Secondary | ICD-10-CM | POA: Diagnosis present

## 2014-12-22 DIAGNOSIS — G9340 Encephalopathy, unspecified: Secondary | ICD-10-CM | POA: Insufficient documentation

## 2014-12-22 DIAGNOSIS — E872 Acidosis: Secondary | ICD-10-CM | POA: Diagnosis present

## 2014-12-22 DIAGNOSIS — Z79899 Other long term (current) drug therapy: Secondary | ICD-10-CM

## 2014-12-22 DIAGNOSIS — Z992 Dependence on renal dialysis: Secondary | ICD-10-CM

## 2014-12-22 DIAGNOSIS — G629 Polyneuropathy, unspecified: Secondary | ICD-10-CM | POA: Diagnosis present

## 2014-12-22 DIAGNOSIS — I469 Cardiac arrest, cause unspecified: Principal | ICD-10-CM | POA: Diagnosis present

## 2014-12-22 DIAGNOSIS — N390 Urinary tract infection, site not specified: Secondary | ICD-10-CM | POA: Diagnosis present

## 2014-12-22 DIAGNOSIS — J9601 Acute respiratory failure with hypoxia: Secondary | ICD-10-CM | POA: Diagnosis present

## 2014-12-22 DIAGNOSIS — R739 Hyperglycemia, unspecified: Secondary | ICD-10-CM | POA: Diagnosis present

## 2014-12-22 DIAGNOSIS — J440 Chronic obstructive pulmonary disease with acute lower respiratory infection: Secondary | ICD-10-CM | POA: Diagnosis present

## 2014-12-22 DIAGNOSIS — G931 Anoxic brain damage, not elsewhere classified: Secondary | ICD-10-CM | POA: Insufficient documentation

## 2014-12-22 DIAGNOSIS — K219 Gastro-esophageal reflux disease without esophagitis: Secondary | ICD-10-CM | POA: Diagnosis present

## 2014-12-22 DIAGNOSIS — R680 Hypothermia, not associated with low environmental temperature: Secondary | ICD-10-CM | POA: Diagnosis present

## 2014-12-22 LAB — I-STAT ARTERIAL BLOOD GAS, ED
Acid-base deficit: 3 mmol/L — ABNORMAL HIGH (ref 0.0–2.0)
Bicarbonate: 27.2 mEq/L — ABNORMAL HIGH (ref 20.0–24.0)
O2 SAT: 95 %
PCO2 ART: 78 mmHg — AB (ref 35.0–45.0)
PH ART: 7.151 — AB (ref 7.350–7.450)
Patient temperature: 98.6
TCO2: 30 mmol/L (ref 0–100)
pO2, Arterial: 99 mmHg (ref 80.0–100.0)

## 2014-12-22 LAB — COMPREHENSIVE METABOLIC PANEL
ALBUMIN: 3 g/dL — AB (ref 3.5–5.0)
ALT: 235 U/L — ABNORMAL HIGH (ref 14–54)
ANION GAP: 11 (ref 5–15)
AST: 235 U/L — ABNORMAL HIGH (ref 15–41)
Alkaline Phosphatase: 66 U/L (ref 38–126)
BUN: 12 mg/dL (ref 6–20)
CHLORIDE: 101 mmol/L (ref 101–111)
CO2: 23 mmol/L (ref 22–32)
Calcium: 9.1 mg/dL (ref 8.9–10.3)
Creatinine, Ser: 4.74 mg/dL — ABNORMAL HIGH (ref 0.44–1.00)
GFR calc Af Amer: 10 mL/min — ABNORMAL LOW (ref >60)
GFR calc non Af Amer: 8 mL/min — ABNORMAL LOW (ref >60)
GLUCOSE: 221 mg/dL — AB (ref 65–99)
POTASSIUM: 4 mmol/L (ref 3.5–5.1)
SODIUM: 135 mmol/L (ref 135–145)
Total Bilirubin: 0.8 mg/dL (ref 0.3–1.2)
Total Protein: 6.3 g/dL — ABNORMAL LOW (ref 6.5–8.1)

## 2014-12-22 LAB — CBC WITH DIFFERENTIAL/PLATELET
BASOS PCT: 0 %
Basophils Absolute: 0 10*3/uL (ref 0.0–0.1)
EOS ABS: 0.3 10*3/uL (ref 0.0–0.7)
Eosinophils Relative: 2 %
HCT: 36.5 % (ref 36.0–46.0)
HEMOGLOBIN: 11.2 g/dL — AB (ref 12.0–15.0)
LYMPHS ABS: 0.8 10*3/uL (ref 0.7–4.0)
Lymphocytes Relative: 6 %
MCH: 27.5 pg (ref 26.0–34.0)
MCHC: 30.7 g/dL (ref 30.0–36.0)
MCV: 89.7 fL (ref 78.0–100.0)
Monocytes Absolute: 0.2 10*3/uL (ref 0.1–1.0)
Monocytes Relative: 2 %
NEUTROS ABS: 12.4 10*3/uL — AB (ref 1.7–7.7)
NEUTROS PCT: 90 %
Platelets: 198 10*3/uL (ref 150–400)
RBC: 4.07 MIL/uL (ref 3.87–5.11)
RDW: 17.1 % — ABNORMAL HIGH (ref 11.5–15.5)
WBC: 13.7 10*3/uL — AB (ref 4.0–10.5)

## 2014-12-22 LAB — URINE MICROSCOPIC-ADD ON

## 2014-12-22 LAB — URINALYSIS, ROUTINE W REFLEX MICROSCOPIC
Glucose, UA: NEGATIVE mg/dL
Ketones, ur: 15 mg/dL — AB
NITRITE: POSITIVE — AB
Protein, ur: >300 mg/dL — AB
SPECIFIC GRAVITY, URINE: 1.023 (ref 1.005–1.030)
pH: 6 (ref 5.0–8.0)

## 2014-12-22 LAB — I-STAT CG4 LACTIC ACID, ED: Lactic Acid, Venous: 4.82 mmol/L (ref 0.5–2.0)

## 2014-12-22 LAB — TROPONIN I: TROPONIN I: 0.21 ng/mL — AB (ref <0.031)

## 2014-12-22 LAB — PROTIME-INR
INR: 1.36 (ref 0.00–1.49)
PROTHROMBIN TIME: 16.9 s — AB (ref 11.6–15.2)

## 2014-12-22 MED ORDER — ETOMIDATE 2 MG/ML IV SOLN
INTRAVENOUS | Status: DC | PRN
  Administered 2014-12-22: 20 mg via INTRAVENOUS

## 2014-12-22 MED ORDER — SODIUM CHLORIDE 0.9 % IV BOLUS (SEPSIS)
1000.0000 mL | Freq: Once | INTRAVENOUS | Status: AC
  Administered 2014-12-22: 1000 mL via INTRAVENOUS

## 2014-12-22 NOTE — ED Notes (Signed)
Prepping for reintubation now.

## 2014-12-22 NOTE — ED Notes (Signed)
Beaton MD at bedside for femoral stick

## 2014-12-22 NOTE — Code Documentation (Signed)
Family at bedside, pt continuing to make no purposeful movement. Pupils pinpoint bilaterally
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
## 2014-12-22 NOTE — ED Provider Notes (Signed)
CSN: [id number]     Arrival date & time 12/30/2014  2200 History   First MD Initiated Contact with Patient 12/25/2014 2235     Chief Complaint  Patient presents with  . Cardiac Arrest    Patient is a 74 y.o. female presenting with altered mental status. The history is provided by the EMS personnel.  Altered Mental Status Presenting symptoms: unresponsiveness   Most recent episode:  Today Episode history:  Single Timing:  Constant Progression:  Unchanged Chronicity:  New Context comment:  Witnessed arrest at home   Past Medical History  Diagnosis Date  . Essential hypertension, benign   . Hypercholesterolemia   . GERD (gastroesophageal reflux disease)   . Asthma   . Osteoporosis   . ESRD (end stage renal disease) on dialysis ([place])   . Anemia of chronic disease   . Peripheral neuropathy ([place])   . Schizoaffective disorder   . Arthritis   . History of pneumonia    Past Surgical History  Procedure Laterality Date  . Tubal ligation    . Breast biopsy    . Refractive surgery      Right eye  . Bone marrow aspiration    . Bone marrow biopsy    . Insertion of dialysis catheter  01/23/2011    Procedure: INSERTION OF DIALYSIS CATHETER;  Surgeon: Angelia Mould, MD;  Location: [place];  Service: Vascular;  Laterality: N/A;  Insertion of Dialysis Catheter- Right Internal Jugular  . Yag laser application  76/28/3151    Procedure: YAG LASER APPLICATION;  Surgeon: Elta Guadeloupe T. Gershon Crane, MD;  Location[facility];  Service: Ophthalmology;  Laterality: Left;  . Esophagogastroduodenoscopy  05/06/2003    Dr.Rourk- schatski's ring o/w normal esophagus, stomach and first and second portions of the duodenum s/p dilation  . Colonoscopy  03/10/2010    Dr.Rourk- pigmentation of the rectum and coln consistent with melanosis coli, o/w unremarkable appearing colonic and rectal mucosa.  . Esophagogastroduodenoscopy N/A 05/11/2014    Procedure: ESOPHAGOGASTRODUODENOSCOPY (EGD);  Surgeon: Daneil Dolin, MD;   [facility];  Service: Endoscopy;  Laterality: N/A;  245  . Esophageal dilation N/A 05/11/2014    Procedure: ESOPHAGEAL DILATION;  Surgeon: Daneil Dolin, MD;  [facility];  Service: Endoscopy;  Laterality: N/A;   Family History  Problem Relation Age of Onset  . Alcohol abuse Brother   . Bipolar disorder Brother   . Dementia Mother   . Depression Mother   . ADD / ADHD Neg Hx   . Anxiety disorder Neg Hx   . Drug abuse Neg Hx   . OCD Neg Hx   . Paranoid behavior Neg Hx   . Schizophrenia Neg Hx   . Seizures Neg Hx   . Physical abuse Neg Hx   . Sexual abuse Neg Hx   . Healthy Daughter   . Healthy Daughter   . Healthy Daughter    Social History  Substance Use Topics  . Smoking status: Former Smoker -- 2.00 packs/day for 37 years    Types: Cigarettes    Quit date: 05/11/1998  . Smokeless tobacco: Former Systems developer    Quit date: 10/22/2001  . Alcohol Use: No   OB History    No data available     Review of Systems  Unable to perform ROS: Patient unresponsive   Allergies  Neurontin; Ativan; and Ferrous gluconate  Home Medications   Prior to Admission medications   Medication Sig Start Date End Date Taking?  Authorizing Provider  ALPRAZolam Duanne Moron) 0.5 MG tablet Take 1 tablet (0.5 mg total) by mouth 3 (three) times daily. 12/02/14 12/02/15  Cloria Spring, MD  amitriptyline (ELAVIL) 25 MG tablet Take 1 tablet (25 mg total) by mouth at bedtime. 12/02/14   Cloria Spring, MD  atorvastatin (LIPITOR) 40 MG tablet Take 1 tablet by mouth daily. 03/06/13   Historical Provider, MD  donepezil (ARICEPT) 5 MG tablet Take 1 tablet (5 mg total) by mouth at bedtime. 12/02/14   Cloria Spring, MD  gabapentin (NEURONTIN) 100 MG capsule Take 100 mg by mouth daily.    Historical Provider, MD  ipratropium (ATROVENT) 0.02 % nebulizer solution Take 500 mcg by nebulization 4 (four) times daily as needed. Shortness of Breath 11/16/11   Rolland Porter, MD  labetalol (NORMODYNE) 200 MG  tablet Take 1 tablet (200 mg total) by mouth 2 (two) times daily. 11/30/14   Lendon Colonel, NP  NIFEdipine (PROCARDIA-XL/ADALAT-CC/NIFEDICAL-XL) 30 MG 24 hr tablet Take 2 tablets (60 mg total) by mouth daily. Ok to take 2 of current rx and I sent in rx for 60 mg 11/30/14   Lendon Colonel, NP  Oxycodone HCl 10 MG TABS Take 1 tablet by mouth 4 (four) times daily as needed. 04/22/14   Historical Provider, MD  pantoprazole (PROTONIX) 40 MG tablet Take 40 mg by mouth daily.    Historical Provider, MD  risperiDONE (RISPERDAL) 1 MG tablet Take 2 tablets (2 mg total) by mouth at bedtime. 12/02/14 12/02/15  Cloria Spring, MD  tiZANidine (ZANAFLEX) 2 MG tablet Take 1 tablet by mouth 2 (two) times daily as needed for muscle spasms.  04/07/14   Historical Provider, MD   BP 162/102 mmHg  Pulse 84  Temp(Src) 96.3 F (35.7 C)  Resp 35  Ht 4' 8.5" (1.435 m)  Wt 86.183 kg  BMI 41.85 kg/m2  SpO2 96% Physical Exam  Constitutional: She is oriented to person, place, and time. No distress.  Obese AAF, unresponsive  HENT:  Head: Normocephalic and atraumatic.  Supraglottic airway device in place  Eyes: EOM are normal. Pupils are equal, round, and reactive to light.  Neck: Neck supple. No JVD present.  Cardiovascular: Normal rate, regular rhythm, normal heart sounds and intact distal pulses.  Exam reveals no gallop.   No murmur heard. Pulmonary/Chest:  Assisted ventilations with bilateral course breath sounds  Abdominal: Soft. She exhibits no distension. There is no tenderness.  Musculoskeletal: Normal range of motion. She exhibits no tenderness.  Neurological: She is alert and oriented to person, place, and time. GCS eye subscore is 1. GCS verbal subscore is 1. GCS motor subscore is 1.  No spontaneous movements  Skin: Skin is warm and dry. No rash noted.  Psychiatric: Her behavior is normal.    ED Course  .Intubation Date/Time: 01/02/15 12:02 AM Performed by: Gustavus Bryant Authorized by:  Gustavus Bryant Consent: The procedure was performed in an emergent situation. Indications: airway protection Intubation method: video-assisted Patient status: sedated Sedatives: etomidate Laryngoscope size: hyperangulated video blade. Tube size: 7.5 mm Tube type: cuffed Number of attempts: 1 Cricoid pressure: no Cords visualized: yes Post-procedure assessment: chest rise and CO2 detector Breath sounds: equal and absent over the epigastrium Cuff inflated: yes ETT to lip: 22 cm Tube secured with: ETT holder Chest x-ray interpreted by me and radiologist. Chest x-ray findings: endotracheal tube in appropriate position Patient tolerance: Patient tolerated the procedure well with no immediate complications     Labs Review Labs  Reviewed  CBC WITH DIFFERENTIAL/PLATELET - Abnormal; Notable for the following:    WBC 13.7 (*)    Hemoglobin 11.2 (*)    RDW 17.1 (*)    Neutro Abs 12.4 (*)    All other components within normal limits  TROPONIN I - Abnormal; Notable for the following:    Troponin I 0.21 (*)    All other components within normal limits  PROTIME-INR - Abnormal; Notable for the following:    Prothrombin Time 16.9 (*)    All other components within normal limits  COMPREHENSIVE METABOLIC PANEL - Abnormal; Notable for the following:    Glucose, Bld 221 (*)    Creatinine, Ser 4.74 (*)    Total Protein 6.3 (*)    Albumin 3.0 (*)    AST 235 (*)    ALT 235 (*)    GFR calc non Af Amer 8 (*)    GFR calc Af Amer 10 (*)    All other components within normal limits  URINALYSIS, ROUTINE W REFLEX MICROSCOPIC (NOT AT [facility]) - Abnormal; Notable for the following:    Color, Urine RED (*)    APPearance TURBID (*)    Hgb urine dipstick LARGE (*)    Bilirubin Urine SMALL (*)    Ketones, ur 15 (*)    Protein, ur >300 (*)    Nitrite POSITIVE (*)    Leukocytes, UA LARGE (*)    All other components within normal limits  URINE MICROSCOPIC-ADD ON - Abnormal; Notable for the following:     Squamous Epithelial / LPF 0-5 (*)    Bacteria, UA FEW (*)    All other components within normal limits  I-STAT CG4 LACTIC ACID, ED - Abnormal; Notable for the following:    Lactic Acid, Venous 4.82 (*)    All other components within normal limits  I-STAT ARTERIAL BLOOD GAS, ED - Abnormal; Notable for the following:    pH, Arterial 7.151 (*)    pCO2 arterial 78.0 (*)    Bicarbonate 27.2 (*)    Acid-base deficit 3.0 (*)    All other components within normal limits  URINE CULTURE  CULTURE, BLOOD (ROUTINE X 2)  CULTURE, BLOOD (ROUTINE X 2)  CULTURE, RESPIRATORY (NON-EXPECTORATED)  MAGNESIUM  PHOSPHORUS  TROPONIN I  TROPONIN I  TROPONIN I  LACTIC ACID, PLASMA  BRAIN NATRIURETIC PEPTIDE  PROCALCITONIN  BLOOD GAS, ARTERIAL  CBC  BASIC METABOLIC PANEL  MAGNESIUM  PHOSPHORUS  BLOOD GAS, ARTERIAL  I-STAT CG4 LACTIC ACID, ED    Imaging Review Dg Chest Portable 1 View  01/09/2015  CLINICAL DATA:  74 year old female status post endotracheal tube placement. EXAM: PORTABLE CHEST 1 VIEW COMPARISON:  Radiograph dated 11/15/2014 FINDINGS: Endotracheal tube with tip approximately 3.7 cm above the carina. In an enteric tube is partially visualized coursing to the left upper abdomen with tip beyond the image cut off. There is a focal area of increased opacity in the right hilar region which may represent pneumonia. Underlying mass is not excluded. Clinical correlation and follow-up recommended. There is stable cardiomegaly. Left costophrenic angle is excluded from the image. The right costophrenic angle is sharp. The osseous structures are unremarkable. Left subclavian vascular stent noted. IMPRESSION: Endotracheal tube above the carina. Right hilar opacity.  Follow-up recommended. Electronically Signed   By: Anner Crete M.D.   On: 12/28/2014 22:46   I have personally reviewed and evaluated these images and lab results as part of my medical decision-making.   EKG  Interpretation  Date/Time:  Wednesday December 22 2014 22:09:27 EST Ventricular Rate:  102 PR Interval:  174 QRS Duration: 133 QT Interval:  386 QTC Calculation: 503 R Axis:   -86 Text Interpretation:  Ectopic atrial tachycardia, unifocal IVCD, consider  atypical RBBB Probable anteroseptal infarct, recent Abnormal ekg Confirmed  by BEATON  MD, ROBERT ([id number]) on 12/24/2014 11:15:24 PM      MDM   Final diagnoses:  Acute respiratory failure with hypercapnia (HCC)  Elevated troponin  Elevated lactic acid level    74 yo AAF with a PMH of ESRD on HD, HTN and hypercholesterolemia who presents by EMS after a witnessed arrest. Patient had dialysis today and then went home where she was in cardiac arrest. EMS delivered CPR and multiple rounds of epi. ROSC was regained. Patient intubated for airway protection on arrival - EMS had placed supraglottic device. Workup here shows a right hilar opacity, urine with possible UTI, elevated troponin and lactic acid levels. Critical care consulted for admission. No acute events in ED. Care assumed by critical care team.  Discussed with Dr. Harriet Pho, MD 2015/01/16 3013  Leonard Schwartz, MD 2015-01-16 2328

## 2014-12-23 ENCOUNTER — Inpatient Hospital Stay (HOSPITAL_COMMUNITY): Payer: Medicare Other

## 2014-12-23 DIAGNOSIS — G9340 Encephalopathy, unspecified: Secondary | ICD-10-CM

## 2014-12-23 DIAGNOSIS — G931 Anoxic brain damage, not elsewhere classified: Secondary | ICD-10-CM | POA: Diagnosis present

## 2014-12-23 DIAGNOSIS — N390 Urinary tract infection, site not specified: Secondary | ICD-10-CM | POA: Diagnosis present

## 2014-12-23 DIAGNOSIS — K7201 Acute and subacute hepatic failure with coma: Secondary | ICD-10-CM | POA: Diagnosis present

## 2014-12-23 DIAGNOSIS — G9341 Metabolic encephalopathy: Secondary | ICD-10-CM | POA: Diagnosis present

## 2014-12-23 DIAGNOSIS — I509 Heart failure, unspecified: Secondary | ICD-10-CM

## 2014-12-23 DIAGNOSIS — J9601 Acute respiratory failure with hypoxia: Secondary | ICD-10-CM | POA: Diagnosis present

## 2014-12-23 DIAGNOSIS — Z79899 Other long term (current) drug therapy: Secondary | ICD-10-CM | POA: Diagnosis not present

## 2014-12-23 DIAGNOSIS — J45909 Unspecified asthma, uncomplicated: Secondary | ICD-10-CM | POA: Diagnosis present

## 2014-12-23 DIAGNOSIS — I120 Hypertensive chronic kidney disease with stage 5 chronic kidney disease or end stage renal disease: Secondary | ICD-10-CM | POA: Diagnosis present

## 2014-12-23 DIAGNOSIS — E872 Acidosis: Secondary | ICD-10-CM | POA: Diagnosis present

## 2014-12-23 DIAGNOSIS — R680 Hypothermia, not associated with low environmental temperature: Secondary | ICD-10-CM | POA: Diagnosis present

## 2014-12-23 DIAGNOSIS — L8990 Pressure ulcer of unspecified site, unspecified stage: Secondary | ICD-10-CM | POA: Insufficient documentation

## 2014-12-23 DIAGNOSIS — Z992 Dependence on renal dialysis: Secondary | ICD-10-CM | POA: Diagnosis not present

## 2014-12-23 DIAGNOSIS — E8770 Fluid overload, unspecified: Secondary | ICD-10-CM | POA: Diagnosis present

## 2014-12-23 DIAGNOSIS — J690 Pneumonitis due to inhalation of food and vomit: Secondary | ICD-10-CM | POA: Diagnosis present

## 2014-12-23 DIAGNOSIS — J440 Chronic obstructive pulmonary disease with acute lower respiratory infection: Secondary | ICD-10-CM | POA: Diagnosis present

## 2014-12-23 DIAGNOSIS — E876 Hypokalemia: Secondary | ICD-10-CM | POA: Diagnosis present

## 2014-12-23 DIAGNOSIS — R740 Nonspecific elevation of levels of transaminase and lactic acid dehydrogenase [LDH]: Secondary | ICD-10-CM | POA: Diagnosis present

## 2014-12-23 DIAGNOSIS — M810 Age-related osteoporosis without current pathological fracture: Secondary | ICD-10-CM | POA: Diagnosis present

## 2014-12-23 DIAGNOSIS — R402312 Coma scale, best motor response, none, at arrival to emergency department: Secondary | ICD-10-CM | POA: Diagnosis present

## 2014-12-23 DIAGNOSIS — J189 Pneumonia, unspecified organism: Secondary | ICD-10-CM | POA: Diagnosis present

## 2014-12-23 DIAGNOSIS — R001 Bradycardia, unspecified: Secondary | ICD-10-CM | POA: Diagnosis not present

## 2014-12-23 DIAGNOSIS — J9602 Acute respiratory failure with hypercapnia: Secondary | ICD-10-CM | POA: Diagnosis present

## 2014-12-23 DIAGNOSIS — K219 Gastro-esophageal reflux disease without esophagitis: Secondary | ICD-10-CM | POA: Diagnosis present

## 2014-12-23 DIAGNOSIS — I469 Cardiac arrest, cause unspecified: Secondary | ICD-10-CM | POA: Diagnosis present

## 2014-12-23 DIAGNOSIS — R739 Hyperglycemia, unspecified: Secondary | ICD-10-CM | POA: Diagnosis present

## 2014-12-23 DIAGNOSIS — N186 End stage renal disease: Secondary | ICD-10-CM | POA: Diagnosis present

## 2014-12-23 DIAGNOSIS — R402112 Coma scale, eyes open, never, at arrival to emergency department: Secondary | ICD-10-CM | POA: Diagnosis present

## 2014-12-23 DIAGNOSIS — Z87891 Personal history of nicotine dependence: Secondary | ICD-10-CM | POA: Diagnosis not present

## 2014-12-23 DIAGNOSIS — D631 Anemia in chronic kidney disease: Secondary | ICD-10-CM | POA: Diagnosis present

## 2014-12-23 DIAGNOSIS — G629 Polyneuropathy, unspecified: Secondary | ICD-10-CM | POA: Diagnosis present

## 2014-12-23 DIAGNOSIS — R402212 Coma scale, best verbal response, none, at arrival to emergency department: Secondary | ICD-10-CM | POA: Diagnosis present

## 2014-12-23 DIAGNOSIS — I249 Acute ischemic heart disease, unspecified: Secondary | ICD-10-CM | POA: Diagnosis present

## 2014-12-23 DIAGNOSIS — E7800 Pure hypercholesterolemia, unspecified: Secondary | ICD-10-CM | POA: Diagnosis present

## 2014-12-23 DIAGNOSIS — Z6841 Body Mass Index (BMI) 40.0 and over, adult: Secondary | ICD-10-CM | POA: Diagnosis not present

## 2014-12-23 DIAGNOSIS — F259 Schizoaffective disorder, unspecified: Secondary | ICD-10-CM | POA: Diagnosis present

## 2014-12-23 LAB — BASIC METABOLIC PANEL
ANION GAP: 8 (ref 5–15)
ANION GAP: 8 (ref 5–15)
Anion gap: 11 (ref 5–15)
BUN: 15 mg/dL (ref 6–20)
BUN: 16 mg/dL (ref 6–20)
BUN: 20 mg/dL (ref 6–20)
CHLORIDE: 102 mmol/L (ref 101–111)
CHLORIDE: 101 mmol/L (ref 101–111)
CHLORIDE: 103 mmol/L (ref 101–111)
CO2: 22 mmol/L (ref 22–32)
CO2: 28 mmol/L (ref 22–32)
CO2: 27 mmol/L (ref 22–32)
CREATININE: 4.93 mg/dL — AB (ref 0.44–1.00)
CREATININE: 5.14 mg/dL — AB (ref 0.44–1.00)
Calcium: 8.7 mg/dL — ABNORMAL LOW (ref 8.9–10.3)
Calcium: 8.6 mg/dL — ABNORMAL LOW (ref 8.9–10.3)
Calcium: 8 mg/dL — ABNORMAL LOW (ref 8.9–10.3)
Creatinine, Ser: 4.46 mg/dL — ABNORMAL HIGH (ref 0.44–1.00)
GFR calc Af Amer: 10 mL/min — ABNORMAL LOW (ref >60)
GFR calc non Af Amer: 9 mL/min — ABNORMAL LOW (ref >60)
GFR calc non Af Amer: 8 mL/min — ABNORMAL LOW (ref >60)
GFR calc non Af Amer: 8 mL/min — ABNORMAL LOW (ref >60)
GFR, EST AFRICAN AMERICAN: 9 mL/min — AB (ref >60)
GFR, EST AFRICAN AMERICAN: 9 mL/min — AB (ref >60)
Glucose, Bld: 192 mg/dL — ABNORMAL HIGH (ref 65–99)
Glucose, Bld: 162 mg/dL — ABNORMAL HIGH (ref 65–99)
Glucose, Bld: 145 mg/dL — ABNORMAL HIGH (ref 65–99)
POTASSIUM: 3.9 mmol/L (ref 3.5–5.1)
POTASSIUM: 3.4 mmol/L — AB (ref 3.5–5.1)
POTASSIUM: 3.6 mmol/L (ref 3.5–5.1)
SODIUM: 135 mmol/L (ref 135–145)
SODIUM: 137 mmol/L (ref 135–145)
SODIUM: 138 mmol/L (ref 135–145)

## 2014-12-23 LAB — CG4 I-STAT (LACTIC ACID): Lactic Acid, Venous: 2.63 mmol/L (ref 0.5–2.0)

## 2014-12-23 LAB — POCT I-STAT 3, ART BLOOD GAS (G3+)
Acid-base deficit: 1 mmol/L (ref 0.0–2.0)
BICARBONATE: 29 meq/L — AB (ref 20.0–24.0)
BICARBONATE: 29.1 meq/L — AB (ref 20.0–24.0)
O2 SAT: 90 %
O2 Saturation: 99 %
PCO2 ART: 69.3 mmHg — AB (ref 35.0–45.0)
PH ART: 7.221 — AB (ref 7.350–7.450)
PO2 ART: 169 mmHg — AB (ref 80.0–100.0)
Patient temperature: 95.7
TCO2: 31 mmol/L (ref 0–100)
TCO2: 31 mmol/L (ref 0–100)
pCO2 arterial: 63.7 mmHg (ref 35.0–45.0)
pH, Arterial: 7.259 — ABNORMAL LOW (ref 7.350–7.450)
pO2, Arterial: 63 mmHg — ABNORMAL LOW (ref 80.0–100.0)

## 2014-12-23 LAB — PHOSPHORUS
Phosphorus: 4.1 mg/dL (ref 2.5–4.6)
Phosphorus: 4.2 mg/dL (ref 2.5–4.6)

## 2014-12-23 LAB — GLUCOSE, CAPILLARY
GLUCOSE-CAPILLARY: 61 mg/dL — AB (ref 65–99)
GLUCOSE-CAPILLARY: 87 mg/dL (ref 65–99)
Glucose-Capillary: 129 mg/dL — ABNORMAL HIGH (ref 65–99)
Glucose-Capillary: 106 mg/dL — ABNORMAL HIGH (ref 65–99)

## 2014-12-23 LAB — TROPONIN I
TROPONIN I: 2.71 ng/mL — AB (ref <0.031)
TROPONIN I: 13.97 ng/mL — AB (ref <0.031)
Troponin I: 0.44 ng/mL — ABNORMAL HIGH (ref <0.031)

## 2014-12-23 LAB — PROCALCITONIN: Procalcitonin: 1.55 ng/mL

## 2014-12-23 LAB — CBC
HEMATOCRIT: 37.6 % (ref 36.0–46.0)
HEMOGLOBIN: 11.6 g/dL — AB (ref 12.0–15.0)
MCH: 27.6 pg (ref 26.0–34.0)
MCHC: 30.9 g/dL (ref 30.0–36.0)
MCV: 89.5 fL (ref 78.0–100.0)
Platelets: 204 10*3/uL (ref 150–400)
RBC: 4.2 MIL/uL (ref 3.87–5.11)
RDW: 17.3 % — ABNORMAL HIGH (ref 11.5–15.5)
WBC: 15.8 10*3/uL — ABNORMAL HIGH (ref 4.0–10.5)

## 2014-12-23 LAB — MRSA PCR SCREENING: MRSA by PCR: NEGATIVE

## 2014-12-23 LAB — LACTIC ACID, PLASMA
Lactic Acid, Venous: 2.5 mmol/L (ref 0.5–2.0)
Lactic Acid, Venous: 1.4 mmol/L (ref 0.5–2.0)

## 2014-12-23 LAB — MAGNESIUM
MAGNESIUM: 1.8 mg/dL (ref 1.7–2.4)
MAGNESIUM: 1.8 mg/dL (ref 1.7–2.4)

## 2014-12-23 LAB — BRAIN NATRIURETIC PEPTIDE: B Natriuretic Peptide: 1301.2 pg/mL — ABNORMAL HIGH (ref 0.0–100.0)

## 2014-12-23 MED ORDER — POTASSIUM CHLORIDE 10 MEQ/50ML IV SOLN
10.0000 meq | INTRAVENOUS | Status: AC
  Administered 2014-12-23 (×2): 10 meq via INTRAVENOUS
  Filled 2014-12-23 (×2): qty 50

## 2014-12-23 MED ORDER — VANCOMYCIN HCL IN DEXTROSE 1-5 GM/200ML-% IV SOLN: 1000.0000 mg | INTRAVENOUS | Status: DC

## 2014-12-23 MED ORDER — SODIUM CHLORIDE 0.9 % IV SOLN: 250.0000 mL | INTRAVENOUS | Status: DC | PRN

## 2014-12-23 MED ORDER — PIPERACILLIN-TAZOBACTAM IN DEX 2-0.25 GM/50ML IV SOLN
2.2500 g | Freq: Three times a day (TID) | INTRAVENOUS | Status: DC
  Administered 2014-12-23: 2.25 g via INTRAVENOUS
  Filled 2014-12-23 (×3): qty 50

## 2014-12-23 MED ORDER — HEPARIN BOLUS VIA INFUSION
3000.0000 [IU] | Freq: Once | INTRAVENOUS | Status: AC
  Administered 2014-12-23: 3000 [IU] via INTRAVENOUS
  Filled 2014-12-23: qty 3000

## 2014-12-23 MED ORDER — CHLORHEXIDINE GLUCONATE 0.12% ORAL RINSE (MEDLINE KIT)
15.0000 mL | Freq: Two times a day (BID) | OROMUCOSAL | Status: DC
  Administered 2014-12-23: 15 mL via OROMUCOSAL

## 2014-12-23 MED ORDER — FAMOTIDINE IN NACL 20-0.9 MG/50ML-% IV SOLN: 20.0000 mg | Freq: Two times a day (BID) | INTRAVENOUS | Status: DC

## 2014-12-23 MED ORDER — IPRATROPIUM-ALBUTEROL 0.5-2.5 (3) MG/3ML IN SOLN
3.0000 mL | Freq: Four times a day (QID) | RESPIRATORY_TRACT | Status: DC
  Administered 2014-12-23: 3 mL via RESPIRATORY_TRACT
  Filled 2014-12-23: qty 3

## 2014-12-23 MED ORDER — ASPIRIN 300 MG RE SUPP
300.0000 mg | RECTAL | Status: AC
  Administered 2014-12-23: 300 mg via RECTAL
  Filled 2014-12-23: qty 1

## 2014-12-23 MED ORDER — HEPARIN (PORCINE) IN NACL 100-0.45 UNIT/ML-% IJ SOLN
750.0000 [IU]/h | INTRAMUSCULAR | Status: DC
  Administered 2014-12-23: 750 [IU]/h via INTRAVENOUS
  Filled 2014-12-23: qty 250

## 2014-12-23 MED ORDER — HEPARIN SODIUM (PORCINE) 5000 UNIT/ML IJ SOLN
5000.0000 [IU] | Freq: Three times a day (TID) | INTRAMUSCULAR | Status: DC
  Administered 2014-12-23: 5000 [IU] via SUBCUTANEOUS
  Filled 2014-12-23 (×2): qty 1

## 2014-12-23 MED ORDER — FENTANYL BOLUS VIA INFUSION
25.0000 ug | INTRAVENOUS | Status: DC | PRN
  Filled 2014-12-23: qty 25

## 2014-12-23 MED ORDER — ASPIRIN 325 MG PO TABS: 325.0000 mg | ORAL_TABLET | Freq: Every day | ORAL | Status: DC

## 2014-12-23 MED ORDER — SODIUM CHLORIDE 0.9 % IV SOLN
INTRAVENOUS | Status: DC
  Administered 2014-12-23: 01:00:00 via INTRAVENOUS

## 2014-12-23 MED ORDER — PIPERACILLIN-TAZOBACTAM IN DEX 2-0.25 GM/50ML IV SOLN
2.2500 g | Freq: Once | INTRAVENOUS | Status: AC
  Administered 2014-12-23: 2.25 g via INTRAVENOUS
  Filled 2014-12-23: qty 50

## 2014-12-23 MED ORDER — CIPROFLOXACIN IN D5W 400 MG/200ML IV SOLN
400.0000 mg | INTRAVENOUS | Status: DC
Start: 2014-12-24 — End: 2014-12-23

## 2014-12-23 MED ORDER — ASPIRIN 81 MG PO CHEW: 324.0000 mg | CHEWABLE_TABLET | ORAL | Status: AC

## 2014-12-23 MED ORDER — FAMOTIDINE IN NACL 20-0.9 MG/50ML-% IV SOLN
20.0000 mg | INTRAVENOUS | Status: DC
  Administered 2014-12-23: 20 mg via INTRAVENOUS
  Filled 2014-12-23: qty 50

## 2014-12-23 MED ORDER — DEXTROSE 50 % IV SOLN
INTRAVENOUS | Status: AC
  Administered 2014-12-23: 25 mL
  Filled 2014-12-23: qty 50

## 2014-12-23 MED ORDER — SODIUM CHLORIDE 0.9 % IV SOLN
25.0000 ug/h | INTRAVENOUS | Status: DC
  Administered 2014-12-23: 50 ug/h via INTRAVENOUS
  Filled 2014-12-23: qty 50

## 2014-12-23 MED ORDER — CIPROFLOXACIN IN D5W 400 MG/200ML IV SOLN: 400.0000 mg | Freq: Once | INTRAVENOUS | Status: DC

## 2014-12-23 MED ORDER — DOXERCALCIFEROL 4 MCG/2ML IV SOLN: 5.0000 ug | INTRAVENOUS | Status: DC

## 2014-12-23 MED ORDER — MIDAZOLAM HCL 2 MG/2ML IJ SOLN
1.0000 mg | INTRAMUSCULAR | Status: DC | PRN
  Administered 2014-12-23: 1 mg via INTRAVENOUS
  Filled 2014-12-23: qty 2

## 2014-12-23 MED ORDER — INSULIN ASPART 100 UNIT/ML ~~LOC~~ SOLN
2.0000 [IU] | SUBCUTANEOUS | Status: DC
  Administered 2014-12-23: 2 [IU] via SUBCUTANEOUS

## 2014-12-23 MED ORDER — MIDAZOLAM HCL 2 MG/2ML IJ SOLN: 1.0000 mg | INTRAMUSCULAR | Status: DC | PRN

## 2014-12-23 MED ORDER — VANCOMYCIN HCL 10 G IV SOLR
2000.0000 mg | Freq: Once | INTRAVENOUS | Status: AC
  Administered 2014-12-23: 2000 mg via INTRAVENOUS
  Filled 2014-12-23: qty 2000

## 2014-12-23 MED ORDER — FUROSEMIDE 10 MG/ML IJ SOLN
80.0000 mg | Freq: Once | INTRAMUSCULAR | Status: AC
  Administered 2014-12-23: 80 mg via INTRAVENOUS
  Filled 2014-12-23: qty 8

## 2014-12-23 MED ORDER — FENTANYL CITRATE (PF) 100 MCG/2ML IJ SOLN
50.0000 ug | Freq: Once | INTRAMUSCULAR | Status: AC
  Administered 2014-12-23: 50 ug via INTRAVENOUS

## 2014-12-23 MED ORDER — ALBUTEROL SULFATE (2.5 MG/3ML) 0.083% IN NEBU: 2.5000 mg | INHALATION_SOLUTION | RESPIRATORY_TRACT | Status: DC | PRN

## 2014-12-23 MED ORDER — ANTISEPTIC ORAL RINSE SOLUTION (CORINZ)
7.0000 mL | Freq: Four times a day (QID) | OROMUCOSAL | Status: DC
  Administered 2014-12-23: 7 mL via OROMUCOSAL

## 2014-12-23 MED ORDER — LORAZEPAM 2 MG/ML IJ SOLN: 2.0000 mg | Freq: Once | INTRAMUSCULAR | Status: DC

## 2014-12-23 MED ORDER — IPRATROPIUM-ALBUTEROL 0.5-2.5 (3) MG/3ML IN SOLN: 3.0000 mL | Freq: Four times a day (QID) | RESPIRATORY_TRACT | Status: DC

## 2014-12-24 LAB — URINE CULTURE
Culture: NO GROWTH
Special Requests: NORMAL

## 2014-12-24 LAB — HEMOGLOBIN A1C
Hgb A1c MFr Bld: 5.8 % — ABNORMAL HIGH (ref 4.8–5.6)
Mean Plasma Glucose: 120 mg/dL

## 2014-12-28 ENCOUNTER — Ambulatory Visit (HOSPITAL_COMMUNITY): Payer: Self-pay

## 2014-12-28 LAB — CULTURE, BLOOD (ROUTINE X 2)
CULTURE: NO GROWTH
Culture: NO GROWTH

## 2015-01-03 ENCOUNTER — Telehealth: Payer: Self-pay

## 2015-01-03 NOTE — Telephone Encounter (Signed)
On 01/03/2015 I received a death certificate from [employer] (original). The death certificate is for burial. The patient is a patient of Doctor Maneem. The death certificate will [facility] this afternoon for signature. On 01/04/2015 I received the death certificate back from Doctor Maneem. I got the death certificate ready and called the funeral home to let them know I was [facility].

## 2015-01-16 NOTE — Progress Notes (Signed)
eLink Physician-Brief Progress Note Patient Name: [name] DOB: 1940-08-25 MRN: [id number]   Date of Service  12/20/2014  HPI/Events of Note  Hypokalemia (3.4) - ESRD on HD  eICU Interventions  KCl 10mEq IV x2 runs     Intervention Category Intermediate Interventions: Electrolyte abnormality - evaluation and management  [name] 01/10/2015, 6:02 AM

## 2015-01-16 NOTE — Progress Notes (Signed)
  Echocardiogram 2D Echocardiogram has been performed.  Leta JunglingCooper, Champagne Paletta M 01/04/2015, 1:49 PM

## 2015-01-16 NOTE — ED Notes (Signed)
CCNP at bedside.  

## 2015-01-16 NOTE — Consult Note (Signed)
Reason for Consult: To manage dialysis and dialysis related needs Referring Physician: [name] is an 75 y.o. female with past medical history significant for hypertension which sounds malignant, obesity, asthma/COPD, hypercholesterolemia, reported history of schizoaffective disorder and also end-stage renal disease who undergoes hemodialysis at the [facility] on Monday Wednesday and Friday. She was noted to undergo her regular treatment on 12/7. I talked to the kidney center and they did not note anything unusual with her treatment on Wednesday. It seems that her blood pressure is high but that's nothing unusual for her. She was not complaining of any chest pain at the time of her treatment and her leaving blood pressure was 196/91. History is obtained through the chart that patient then was not feeling well after dialysis, had chest pain and then was found down. CPR was initiated she was intubated in the field and brought to the hospital. Her initial lactate was elevated in the fours but it since has come down to the ones. Her initial troponin was only slightly high but now is close to 14.  Per report, she's been unresponsive since her event. She's currently in the intensive care unit on the ventilator on no pressors- she's being treated for pneumonia.  The FiO2 on the and has required increasing now up to 80%.  Also of note, an EEG was done which was consistent with diffuse anoxic brain injury   Dialyzes at [facility]  [phone]  EDW 86.5. 3 and half hours HD Bath 2K/2.5 Calc, Dialyzer 180, Heparin 2000 bolus- then 1000 per hour. Access AVG- left arm . Meds also include Epogen 2400 units every treatment, Hectorol 5 g every treatment  Past Medical History  Diagnosis Date  . Essential hypertension, benign   . Hypercholesterolemia   . GERD (gastroesophageal reflux disease)   . Asthma   . Osteoporosis   . ESRD (end stage renal disease) on dialysis  ([place])   . Anemia of chronic disease   . Peripheral neuropathy ([place])   . Schizoaffective disorder   . Arthritis   . History of pneumonia     Past Surgical History  Procedure Laterality Date  . Tubal ligation    . Breast biopsy    . Refractive surgery      Right eye  . Bone marrow aspiration    . Bone marrow biopsy    . Insertion of dialysis catheter  01/23/2011    Procedure: INSERTION OF DIALYSIS CATHETER;  Surgeon: Angelia Mould, MD;  Location: [place];  Service: Vascular;  Laterality: N/A;  Insertion of Dialysis Catheter- Right Internal Jugular  . Yag laser application  10/93/2355    Procedure: YAG LASER APPLICATION;  Surgeon: Elta Guadeloupe T. Gershon Crane, MD;  Location: [facility];  Service: Ophthalmology;  Laterality: Left;  . Esophagogastroduodenoscopy  05/06/2003    Dr.Rourk- schatski's ring o/w normal esophagus, stomach and first and second portions of the duodenum s/p dilation  . Colonoscopy  03/10/2010    Dr.Rourk- pigmentation of the rectum and coln consistent with melanosis coli, o/w unremarkable appearing colonic and rectal mucosa.  . Esophagogastroduodenoscopy N/A 05/11/2014    Procedure: ESOPHAGOGASTRODUODENOSCOPY (EGD);  Surgeon: Daneil Dolin, MD;  Location: [facility];  Service: Endoscopy;  Laterality: N/A;  245  . Esophageal dilation N/A 05/11/2014    Procedure: ESOPHAGEAL DILATION;  Surgeon: Daneil Dolin, MD;  Location: [facility];  Service: Endoscopy;  Laterality: N/A;    Family History  Problem Relation Age  of Onset  . Alcohol abuse Brother   . Bipolar disorder Brother   . Dementia Mother   . Depression Mother   . ADD / ADHD Neg Hx   . Anxiety disorder Neg Hx   . Drug abuse Neg Hx   . OCD Neg Hx   . Paranoid behavior Neg Hx   . Schizophrenia Neg Hx   . Seizures Neg Hx   . Physical abuse Neg Hx   . Sexual abuse Neg Hx   . Healthy Daughter   . Healthy Daughter   . Healthy Daughter     Social History:  reports that she quit smoking about 16 years ago. Her  smoking use included Cigarettes. She has a 74 pack-year smoking history. She quit smokeless tobacco use about 13 years ago. She reports that she does not drink alcohol or use illicit drugs.  Allergies:  Allergies  Allergen Reactions  . Neurontin [Gabapentin] Other (See Comments)    Had about every known side effect with $RemoveBef'300mg'RIqIVxMHHa$  three a day on this. DO NOT TRY THIS AGAIN!  . Ativan [Lorazepam] Nausea Only and Other (See Comments)    Disoriented, not feel like herself   . Ferrous Gluconate Nausea Only    hypotension    Medications: I have reviewed the patient's current medications.   Results for orders placed or performed during the hospital encounter of 01/09/2015 (from the past 48 hour(s))  CBC with Differential     Status: Abnormal   Collection Time: 01/09/2015 10:13 PM  Result Value Ref Range   WBC 13.7 (H) 4.0 - 10.5 K/uL   RBC 4.07 3.87 - 5.11 MIL/uL   Hemoglobin 11.2 (L) 12.0 - 15.0 g/dL   HCT 36.5 36.0 - 46.0 %   MCV 89.7 78.0 - 100.0 fL   MCH 27.5 26.0 - 34.0 pg   MCHC 30.7 30.0 - 36.0 g/dL   RDW 17.1 (H) 11.5 - 15.5 %   Platelets 198 150 - 400 K/uL   Neutrophils Relative % 90 %   Neutro Abs 12.4 (H) 1.7 - 7.7 K/uL   Lymphocytes Relative 6 %   Lymphs Abs 0.8 0.7 - 4.0 K/uL   Monocytes Relative 2 %   Monocytes Absolute 0.2 0.1 - 1.0 K/uL   Eosinophils Relative 2 %   Eosinophils Absolute 0.3 0.0 - 0.7 K/uL   Basophils Relative 0 %   Basophils Absolute 0.0 0.0 - 0.1 K/uL  Troponin I     Status: Abnormal   Collection Time: 12/29/2014 10:13 PM  Result Value Ref Range   Troponin I 0.21 (H) <0.031 ng/mL    Comment:        PERSISTENTLY INCREASED TROPONIN VALUES IN THE RANGE OF 0.04-0.49 ng/mL CAN BE SEEN IN:       -UNSTABLE ANGINA       -CONGESTIVE HEART FAILURE       -MYOCARDITIS       -CHEST TRAUMA       -ARRYHTHMIAS       -LATE PRESENTING MYOCARDIAL INFARCTION       -COPD   CLINICAL FOLLOW-UP RECOMMENDED.   Protime-INR     Status: Abnormal   Collection Time:  01/10/2015 10:13 PM  Result Value Ref Range   Prothrombin Time 16.9 (H) 11.6 - 15.2 seconds   INR 1.36 0.00 - 1.49  Comprehensive metabolic panel     Status: Abnormal   Collection Time: 12/17/2014 10:13 PM  Result Value Ref Range   Sodium 135 135 - 145  mmol/L   Potassium 4.0 3.5 - 5.1 mmol/L   Chloride 101 101 - 111 mmol/L   CO2 23 22 - 32 mmol/L   Glucose, Bld 221 (H) 65 - 99 mg/dL   BUN 12 6 - 20 mg/dL   Creatinine, Ser 4.74 (H) 0.44 - 1.00 mg/dL   Calcium 9.1 8.9 - 10.3 mg/dL   Total Protein 6.3 (L) 6.5 - 8.1 g/dL   Albumin 3.0 (L) 3.5 - 5.0 g/dL   AST 235 (H) 15 - 41 U/L   ALT 235 (H) 14 - 54 U/L   Alkaline Phosphatase 66 38 - 126 U/L   Total Bilirubin 0.8 0.3 - 1.2 mg/dL   GFR calc non Af Amer 8 (L) >60 mL/min   GFR calc Af Amer 10 (L) >60 mL/min    Comment: (NOTE) The eGFR has been calculated using the CKD EPI equation. This calculation has not been validated in all clinical situations. eGFR's persistently <60 mL/min signify possible Chronic Kidney Disease.    Anion gap 11 5 - 15  I-Stat CG4 Lactic Acid, ED     Status: Abnormal   Collection Time: 12/19/2014 10:16 PM  Result Value Ref Range   Lactic Acid, Venous 4.82 (HH) 0.5 - 2.0 mmol/L   Comment NOTIFIED PHYSICIAN   Urinalysis, Routine w reflex microscopic (not at [facility])     Status: Abnormal   Collection Time: 01/12/2015 10:39 PM  Result Value Ref Range   Color, Urine RED (A) YELLOW    Comment: BIOCHEMICALS MAY BE AFFECTED BY COLOR   APPearance TURBID (A) CLEAR   Specific Gravity, Urine 1.023 1.005 - 1.030   pH 6.0 5.0 - 8.0   Glucose, UA NEGATIVE NEGATIVE mg/dL   Hgb urine dipstick LARGE (A) NEGATIVE   Bilirubin Urine SMALL (A) NEGATIVE   Ketones, ur 15 (A) NEGATIVE mg/dL   Protein, ur >300 (A) NEGATIVE mg/dL   Nitrite POSITIVE (A) NEGATIVE   Leukocytes, UA LARGE (A) NEGATIVE  Urine culture     Status: None (Preliminary result)   Collection Time: 12/25/2014 10:39 PM  Result Value Ref Range   Specimen Description  URINE, CATHETERIZED    Special Requests Normal    Culture NO GROWTH < 12 HOURS    Report Status PENDING   Urine microscopic-add on     Status: Abnormal   Collection Time: 12/16/2014 10:39 PM  Result Value Ref Range   Squamous Epithelial / LPF 0-5 (A) NONE SEEN   WBC, UA TOO NUMEROUS TO COUNT 0 - 5 WBC/hpf   RBC / HPF 6-30 0 - 5 RBC/hpf   Bacteria, UA FEW (A) NONE SEEN  I-Stat Arterial Blood Gas, ED - (order at [facility] and [facility] only)     Status: Abnormal   Collection Time: 12/22/14 11:57 PM  Result Value Ref Range   pH, Arterial 7.151 (LL) 7.350 - 7.450   pCO2 arterial 78.0 (HH) 35.0 - 45.0 mmHg   pO2, Arterial 99.0 80.0 - 100.0 mmHg   Bicarbonate 27.2 (H) 20.0 - 24.0 mEq/L   TCO2 30 0 - 100 mmol/L   O2 Saturation 95.0 %   Acid-base deficit 3.0 (H) 0.0 - 2.0 mmol/L   Patient temperature 98.6 F    Collection site ARTERIAL LINE    Drawn by Operator    Sample type ARTERIAL    Comment NOTIFIED PHYSICIAN   Magnesium     Status: None   Collection Time: 07-Jan-2015  1:18 AM  Result Value Ref Range   Magnesium  1.8 1.7 - 2.4 mg/dL  Phosphorus     Status: None   Collection Time: 25-Dec-2014  1:18 AM  Result Value Ref Range   Phosphorus 4.2 2.5 - 4.6 mg/dL  Troponin I     Status: Abnormal   Collection Time: Dec 25, 2014  1:18 AM  Result Value Ref Range   Troponin I 0.44 (H) <0.031 ng/mL    Comment:        PERSISTENTLY INCREASED TROPONIN VALUES IN THE RANGE OF 0.04-0.49 ng/mL CAN BE SEEN IN:       -UNSTABLE ANGINA       -CONGESTIVE HEART FAILURE       -MYOCARDITIS       -CHEST TRAUMA       -ARRYHTHMIAS       -LATE PRESENTING MYOCARDIAL INFARCTION       -COPD   CLINICAL FOLLOW-UP RECOMMENDED.   Lactic acid, plasma     Status: Abnormal   Collection Time: 12/25/2014  1:18 AM  Result Value Ref Range   Lactic Acid, Venous 2.5 (HH) 0.5 - 2.0 mmol/L    Comment: CRITICAL RESULT CALLED TO, READ BACK BY AND VERIFIED WITH: YIMBU C,RN 2014-12-25 0235 WAYK   Brain natriuretic peptide     Status: Abnormal    Collection Time: Dec 25, 2014  1:18 AM  Result Value Ref Range   B Natriuretic Peptide 1301.2 (H) 0.0 - 100.0 pg/mL  Procalcitonin     Status: None   Collection Time: Dec 25, 2014  1:18 AM  Result Value Ref Range   Procalcitonin 1.55 ng/mL    Comment:        Interpretation: PCT > 0.5 ng/mL and <= 2 ng/mL: Systemic infection (sepsis) is possible, but other conditions are known to elevate PCT as well. (NOTE)         ICU PCT Algorithm               Non ICU PCT Algorithm    ----------------------------     ------------------------------         PCT < 0.25 ng/mL                 PCT < 0.1 ng/mL     Stopping of antibiotics            Stopping of antibiotics       strongly encouraged.               strongly encouraged.    ----------------------------     ------------------------------       PCT level decrease by               PCT < 0.25 ng/mL       >= 80% from peak PCT       OR PCT 0.25 - 0.5 ng/mL          Stopping of antibiotics                                             encouraged.     Stopping of antibiotics           encouraged.    ----------------------------     ------------------------------       PCT level decrease by              PCT >= 0.25 ng/mL       < 80% from peak PCT  AND PCT >= 0.5 ng/mL             Continuing antibiotics                                              encouraged.       Continuing antibiotics            encouraged.    ----------------------------     ------------------------------     PCT level increase compared          PCT > 0.5 ng/mL         with peak PCT AND          PCT >= 0.5 ng/mL             Escalation of antibiotics                                          strongly encouraged.      Escalation of antibiotics        strongly encouraged.   CBC     Status: Abnormal   Collection Time: 2015-01-14  1:18 AM  Result Value Ref Range   WBC 15.8 (H) 4.0 - 10.5 K/uL   RBC 4.20 3.87 - 5.11 MIL/uL   Hemoglobin 11.6 (L) 12.0 - 15.0 g/dL   HCT 87.6 18.4  - 27.6 %   MCV 89.5 78.0 - 100.0 fL   MCH 27.6 26.0 - 34.0 pg   MCHC 30.9 30.0 - 36.0 g/dL   RDW 48.7 (H) 65.5 - 14.9 %   Platelets 204 150 - 400 K/uL  Basic metabolic panel     Status: Abnormal   Collection Time: 01-14-2015  1:18 AM  Result Value Ref Range   Sodium 135 135 - 145 mmol/L   Potassium 3.9 3.5 - 5.1 mmol/L   Chloride 102 101 - 111 mmol/L   CO2 22 22 - 32 mmol/L   Glucose, Bld 192 (H) 65 - 99 mg/dL   BUN 15 6 - 20 mg/dL   Creatinine, Ser 2.06 (H) 0.44 - 1.00 mg/dL   Calcium 8.7 (L) 8.9 - 10.3 mg/dL   GFR calc non Af Amer 9 (L) >60 mL/min   GFR calc Af Amer 10 (L) >60 mL/min    Comment: (NOTE) The eGFR has been calculated using the CKD EPI equation. This calculation has not been validated in all clinical situations. eGFR's persistently <60 mL/min signify possible Chronic Kidney Disease.    Anion gap 11 5 - 15  Magnesium     Status: None   Collection Time: 2015-01-14  1:18 AM  Result Value Ref Range   Magnesium 1.8 1.7 - 2.4 mg/dL  Phosphorus     Status: None   Collection Time: 01/14/15  1:18 AM  Result Value Ref Range   Phosphorus 4.1 2.5 - 4.6 mg/dL  CG4 I-STAT (Lactic acid)     Status: Abnormal   Collection Time: 2015/01/14  1:32 AM  Result Value Ref Range   Lactic Acid, Venous 2.63 (HH) 0.5 - 2.0 mmol/L   Comment NOTIFIED PHYSICIAN   MRSA PCR Screening     Status: None   Collection Time: January 14, 2015  1:45 AM  Result Value Ref Range   MRSA by PCR NEGATIVE NEGATIVE  Comment:        The GeneXpert MRSA Assay (FDA approved for NASAL specimens only), is one component of a comprehensive MRSA colonization surveillance program. It is not intended to diagnose MRSA infection nor to guide or monitor treatment for MRSA infections.   I-STAT 3, arterial blood gas (G3+)     Status: Abnormal   Collection Time: January 18, 2015  3:18 AM  Result Value Ref Range   pH, Arterial 7.221 (L) 7.350 - 7.450   pCO2 arterial 69.3 (HH) 35.0 - 45.0 mmHg   pO2, Arterial 169.0 (H) 80.0 -  100.0 mmHg   Bicarbonate 29.0 (H) 20.0 - 24.0 mEq/L   TCO2 31 0 - 100 mmol/L   O2 Saturation 99.0 %   Acid-base deficit 1.0 0.0 - 2.0 mmol/L   Patient temperature 95.7 F    Sample type ARTERIAL    Comment NOTIFIED PHYSICIAN   Basic metabolic panel     Status: Abnormal   Collection Time: 18-Jan-2015  4:18 AM  Result Value Ref Range   Sodium 137 135 - 145 mmol/L   Potassium 3.4 (L) 3.5 - 5.1 mmol/L   Chloride 101 101 - 111 mmol/L   CO2 28 22 - 32 mmol/L   Glucose, Bld 162 (H) 65 - 99 mg/dL   BUN 16 6 - 20 mg/dL   Creatinine, Ser 4.93 (H) 0.44 - 1.00 mg/dL   Calcium 8.6 (L) 8.9 - 10.3 mg/dL   GFR calc non Af Amer 8 (L) >60 mL/min   GFR calc Af Amer 9 (L) >60 mL/min    Comment: (NOTE) The eGFR has been calculated using the CKD EPI equation. This calculation has not been validated in all clinical situations. eGFR's persistently <60 mL/min signify possible Chronic Kidney Disease.    Anion gap 8 5 - 15  Glucose, capillary     Status: Abnormal   Collection Time: 2015/01/18  4:56 AM  Result Value Ref Range   Glucose-Capillary 129 (H) 65 - 99 mg/dL   Comment 1 Capillary Specimen   I-STAT 3, arterial blood gas (G3+)     Status: Abnormal   Collection Time: 18-Jan-2015  6:16 AM  Result Value Ref Range   pH, Arterial 7.259 (L) 7.350 - 7.450   pCO2 arterial 63.7 (HH) 35.0 - 45.0 mmHg   pO2, Arterial 63.0 (L) 80.0 - 100.0 mmHg   Bicarbonate 29.1 (H) 20.0 - 24.0 mEq/L   TCO2 31 0 - 100 mmol/L   O2 Saturation 90.0 %   Patient temperature 95.7 F    Sample type ARTERIAL    Comment NOTIFIED PHYSICIAN   Troponin I     Status: Abnormal   Collection Time: 2015-01-18  7:51 AM  Result Value Ref Range   Troponin I 2.71 (HH) <0.031 ng/mL    Comment:        POSSIBLE MYOCARDIAL ISCHEMIA. SERIAL TESTING RECOMMENDED. CRITICAL RESULT CALLED TO, READ BACK BY AND VERIFIED WITH: E.BETHEL,RN 0910 01-18-2015 CLARK,S   Lactic acid, plasma     Status: None   Collection Time: Jan 18, 2015  7:51 AM  Result Value Ref  Range   Lactic Acid, Venous 1.4 0.5 - 2.0 mmol/L  Glucose, capillary     Status: Abnormal   Collection Time: 2015/01/18  8:36 AM  Result Value Ref Range   Glucose-Capillary 106 (H) 65 - 99 mg/dL   Comment 1 Venous Specimen   Basic metabolic panel     Status: Abnormal   Collection Time: January 18, 2015 12:08 PM  Result Value Ref  Range   Sodium 138 135 - 145 mmol/L   Potassium 3.6 3.5 - 5.1 mmol/L   Chloride 103 101 - 111 mmol/L   CO2 27 22 - 32 mmol/L   Glucose, Bld 145 (H) 65 - 99 mg/dL   BUN 20 6 - 20 mg/dL   Creatinine, Ser 5.14 (H) 0.44 - 1.00 mg/dL   Calcium 8.0 (L) 8.9 - 10.3 mg/dL   GFR calc non Af Amer 8 (L) >60 mL/min   GFR calc Af Amer 9 (L) >60 mL/min    Comment: (NOTE) The eGFR has been calculated using the CKD EPI equation. This calculation has not been validated in all clinical situations. eGFR's persistently <60 mL/min signify possible Chronic Kidney Disease.    Anion gap 8 5 - 15  Troponin I     Status: Abnormal   Collection Time: 12-27-14 12:08 PM  Result Value Ref Range   Troponin I 13.97 (HH) <0.031 ng/mL    Comment:        POSSIBLE MYOCARDIAL ISCHEMIA. SERIAL TESTING RECOMMENDED. CRITICAL RESULT CALLED TO, READ BACK BY AND VERIFIED WITH: ELLA BETHEL,RN AT 0881 27-Dec-2014 BY ZBEECH.     Ct Head Wo Contrast  27-Dec-2014  CLINICAL DATA:  75 year old female with cardiac arrest EXAM: CT HEAD WITHOUT CONTRAST TECHNIQUE: Contiguous axial images were obtained from the base of the skull through the vertex without intravenous contrast. COMPARISON:  None FINDINGS: The ventricles are dilated and the sulci are prominent compatible with age-related atrophy. Periventricular and deep white matter hypodensities represent chronic microvascular ischemic changes. There is no intracranial hemorrhage. No mass effect or midline shift identified. The visualized paranasal sinuses and mastoid air cells are well aerated. The calvarium is intact. IMPRESSION: No acute intracranial pathology.  Age-related atrophy and chronic microvascular ischemic disease. Electronically Signed   By: Anner Crete M.D.   On: 2014-12-27 02:16   Dg Chest Portable 1 View  12/24/2014  CLINICAL DATA:  75 year old female status post endotracheal tube placement. EXAM: PORTABLE CHEST 1 VIEW COMPARISON:  Radiograph dated 11/15/2014 FINDINGS: Endotracheal tube with tip approximately 3.7 cm above the carina. In an enteric tube is partially visualized coursing to the left upper abdomen with tip beyond the image cut off. There is a focal area of increased opacity in the right hilar region which may represent pneumonia. Underlying mass is not excluded. Clinical correlation and follow-up recommended. There is stable cardiomegaly. Left costophrenic angle is excluded from the image. The right costophrenic angle is sharp. The osseous structures are unremarkable. Left subclavian vascular stent noted. IMPRESSION: Endotracheal tube above the carina. Right hilar opacity.  Follow-up recommended. Electronically Signed   By: Anner Crete M.D.   On: 12/24/2014 22:46    ROS unable to obtain as patient is unresponsive and no family is at bedside  Blood pressure 115/86, pulse 110, temperature 98.2 F (36.8 C), temperature source Axillary, resp. rate 29, height 4' 8.5" (1.435 m), weight 86.6 kg (190 lb 14.7 oz), SpO2 94 %. General appearance: mild distress, morbidly obese, uncooperative and On vent Eyes: conjunctivae/corneas clear. PERRL, EOM's intact. Fundi benign., Periorbitally edema Resp: rales bilaterally Cardio: regular rate and rhythm, S1, S2 normal, no murmur, click, rub or gallop GI: abnormal findings:  hypoactive bowel sounds Extremities: edema Pitting edema to dependent areas Neurologic: Mental status: Unresponsive Left forearm AV graft with good thrill and bruit  Assessment/Plan: 75 year old black female with a psychiatric disorder, malignant hypertension and ESRD who status post cardiac arrest at home 1 status  post  cardiac arrest- per cardiology and CCM. Markedly positive troponins, echo is pending 2 ESRD: Normally Monday Wednesday Friday at [facility] via [facility]. It seems a 3-1/2 hours is a very low time for this patient especially given her significant hypertension.  She seems to be volume overloaded right now requiring an FiO2 of 80%. I will look to do dialysis sooner rather than later mostly to try to get her FiO2 down and decreased barotrauma 3 Hypertension: According to the kidney Center her "normal" blood pressure is very high. Right now her blood pressure is good, which likely indicates hypoperfusion for her. We'll do dialysis and try to remove volume as tolerated.  She is on no blood pressure medications but is on fentanyl-  looking to wean as able 4. Anemia of ESRD: Is stable at this time. Is on Epogen with dialysis. No meds for now 5. Metabolic Bone Disease: Normally gets Hectorol with dialysis as above. Will add to here 6. Neuro- concerning as the time that she was down was significant. Continue to follow for neurologic changes   [name] A 2015/01/14, 1:17 PM

## 2015-01-16 NOTE — Progress Notes (Signed)
eLink Physician-Brief Progress Note Patient Name: [name] DOB: [birth year] MRN: [id number]   Date of Service  01/05/2015  HPI/Events of Note  Hypothermia & limited IV access (1 PIV).  [facility]ICU Inter[facility]  [facility]     Intervention Category Intermediate Interventions: Other:  Lawanda CousinsJennings Eliah Ozawa 01/03/2015, 2:41 AM

## 2015-01-16 NOTE — Progress Notes (Signed)
CRITICAL VALUE ALERT  Critical value received:  Lactic Acid 2.5  Date of notification:  01/15/2015  Time of notification:  0235  Critical value read back: Yes  Nurse who received alert: Marco Collieharis Tahj Lindseth RN  MD notified (1st page):  Dr. Jamison NeighborNestor  Time of first page:  0238  Responding MD:  Dr. Jamison NeighborNestor  Time MD responded:  [phone]

## 2015-01-16 NOTE — Progress Notes (Signed)
CRITICAL VALUE ALERT  Critical value received: Troponin 13.97  Date of notification:  12/08/02016  Time of notification:  1300  Critical value read back:Yes.    Nurse who received alert:  Jonetta SpeakElla Clotine Heiner RN  MD notified (1st page):  Mannam  Time of first page:  1310  MD notified (2nd page):  Time of second page:  Responding MD:  Dr. Isaiah SergeMannam  Time MD responded:  [phone]

## 2015-01-16 NOTE — Procedures (Signed)
ELECTROENCEPHALOGRAM REPORT  Patient: [name]       Room #: 8G952H04 EEG No. ID: [id number] Age: 75 y.o.        Sex: female Referring Physician: Tyson AliasFEINSTEIN, D Report Date:  12/21/2014        Interpreting Physician: Aline BrochureSTEWART,Coye Dawood R  History: [name] is an 75 y.o. female with a history of end-stage renal disease on hemodialysis, and COPD admitted following cardiac arrest on 12-07-2014. Patient has remained unresponsive and on ventilatory support.  Indications for study:  Assess severity of encephalopathy; rule out seizure activity.  Technique: This is an 18 channel routine scalp EEG performed at the bedside with bipolar and monopolar montages arranged in accordance to the international 10/20 system of electrode placement.   Description: Patient was sedated with fentanyl at the time of this study. Predominant activity consisted of markedly suppressed diffuse brain activity with markedly low 0.5-1 Hz delta activity, with occasional high amplitude bursts of delta activity lasting up to 2 seconds, and occurring at intervals of 18-70 seconds. There were 2 instances of markedly increased diffuse brain activity with mixed delta and theta activity associated with eye opening lasting up to 10 seconds. Photic stimulation was not performed. No epileptiform discharges were recorded.  Interpretation: This EEG is abnormal with findings consistent with markedly severe encephalopathic process with severe slowing of cerebral activity with a burst suppression pattern (mostly suppression and infrequent bursts). This pattern is most frequently seen with diffuse anoxic brain injury, but can be seen with metabolic as well as toxic encephalopathies. No evidence of epileptiform activity was recorded.   Venetia MaxonR Odile Veloso M.D. [facility] [phone]

## 2015-01-16 NOTE — Progress Notes (Signed)
ANTIBIOTIC CONSULT NOTE - INITIAL  Pharmacy Consult for Vancocin and Zosyn Indication: rule out pneumonia  Allergies  Allergen Reactions  . Neurontin [Gabapentin] Other (See Comments)    Had about every known side effect with 300mg  three a day on this. DO NOT TRY THIS AGAIN!  . Ativan [Lorazepam] Nausea Only and Other (See Comments)    Disoriented, not feel like herself   . Ferrous Gluconate Nausea Only    hypotension    Patient Measurements: Height: 4' 8.5" (143.5 cm) Weight: 190 lb 14.7 oz (86.6 kg) IBW/kg (Calculated) : 37.45  Vital Signs: Temp: 95.7 F (35.4 C) (12/08 0245) Temp Source: Core (Comment) (12/08 0215) BP: 137/83 mmHg (12/08 0245) Pulse Rate: 77 (12/08 0245)  Labs:  Recent Labs  Oct 27, 2014 2213 12/20/2014 0118  WBC 13.7* 15.8*  HGB 11.2* 11.6*  PLT 198 204  CREATININE 4.74* 4.46*   Estimated Creatinine Clearance: 10.3 mL/min (by C-G formula based on Cr of 4.46).  Medical History: Past Medical History  Diagnosis Date  . Essential hypertension, benign   . Hypercholesterolemia   . GERD (gastroesophageal reflux disease)   . Asthma   . Osteoporosis   . ESRD (end stage renal disease) on dialysis (HCC)   . Anemia of chronic disease   . Peripheral neuropathy (HCC)   . Schizoaffective disorder   . Arthritis   . History of pneumonia     Medications:  Prescriptions prior to admission  Medication Sig Dispense Refill Last Dose  . ALPRAZolam (XANAX) 0.5 MG tablet Take 1 tablet (0.5 mg total) by mouth 3 (three) times daily. 90 tablet 3   . amitriptyline (ELAVIL) 25 MG tablet Take 1 tablet (25 mg total) by mouth at bedtime. 30 tablet 3   . atorvastatin (LIPITOR) 40 MG tablet Take 1 tablet by mouth daily.   Taking  . donepezil (ARICEPT) 5 MG tablet Take 1 tablet (5 mg total) by mouth at bedtime. 30 tablet 2   . gabapentin (NEURONTIN) 100 MG capsule Take 100 mg by mouth daily.   Taking  . ipratropium (ATROVENT) 0.02 % nebulizer solution Take 500 mcg by  nebulization 4 (four) times daily as needed. Shortness of Breath   Taking  . labetalol (NORMODYNE) 200 MG tablet Take 1 tablet (200 mg total) by mouth 2 (two) times daily. 180 tablet 3 Taking  . NIFEdipine (PROCARDIA-XL/ADALAT-CC/NIFEDICAL-XL) 30 MG 24 hr tablet Take 2 tablets (60 mg total) by mouth daily. Ok to take 2 of current rx and I sent in rx for 60 mg 60 tablet 3 Taking  . Oxycodone HCl 10 MG TABS Take 1 tablet by mouth 4 (four) times daily as needed.   Taking  . pantoprazole (PROTONIX) 40 MG tablet Take 40 mg by mouth daily.   Taking  . risperiDONE (RISPERDAL) 1 MG tablet Take 2 tablets (2 mg total) by mouth at bedtime. 60 tablet 2   . tiZANidine (ZANAFLEX) 2 MG tablet Take 1 tablet by mouth 2 (two) times daily as needed for muscle spasms.    Taking   Scheduled:  . famotidine (PEPCID) IV  20 mg Intravenous Q24H  . fentaNYL (SUBLIMAZE) injection  50 mcg Intravenous Once  . heparin  5,000 Units Subcutaneous 3 times per day  . LORazepam  2 mg Intramuscular Once   Infusions:  . sodium chloride 10 mL/hr at 01/06/2015 0116  . fentaNYL infusion INTRAVENOUS 50 mcg/hr (12/20/2014 0115)    Assessment: 74yo female admitted s/p cardiac arrest, found w/ elevated lactic acid and  WBC, concern for PNA, to broaden IV ABX, started on Cipro, now d/c'd.  Goal of Therapy:  Vancomycin trough level 15-20 mcg/ml  Plan:  Will give vancomycin  IV x1 followed by  IV after each HD as well as Zosyn 2.25g IV Q8H and monitor CBC, Cx, levels prn.  Vernard Gambles, PharmD, BCPS  01/05/2015,3:56 AM

## 2015-01-16 NOTE — Progress Notes (Signed)
Utilization review completed. Marlicia Sroka, RN, BSN. 

## 2015-01-16 NOTE — Progress Notes (Signed)
CRITICAL VALUE ALERT  Critical value received: Troponin 2.71 Date of notification:  12/16/2014  Time of notification:  0915  Critical value read back:Yes.    Nurse who received alert:  Jonetta SpeakElla Noele Icenhour RN  MD notified (1st page):  Dr. Delton CoombesByrum  Time of first page:  0920  MD notified (2nd page):  Time of second page:  Responding MD:  Dr. Delton CoombesByrum  Time MD responded:  [phone]

## 2015-01-16 NOTE — Progress Notes (Signed)
Patient HR alarming 30's. Nurse entered room to find patient with gasping respirations. HR range from 30-70.  Family notified and encouraged to come to hospital now.(already enroute) Patient HR 0 with no respirations and no BP noted. Patient pronounced at 2:22 verified by 2 RN's Jonetta SpeakElla Lowen Mansouri RN and Suzzette RighterErin Smith RN. Dr. Isaiah SergeMannam made aware and came to unit to speak with family. Daughters at bedside.

## 2015-01-16 NOTE — Progress Notes (Signed)
Lost only PIV due to infiltration. Dr. Jamison NeighborNestor notified, IV team at the bedside attempting PIV insertion. Awaiting CCM for central line placement. VSS.

## 2015-01-16 NOTE — Progress Notes (Signed)
EEG completed, results pending. 

## 2015-01-16 NOTE — ED Notes (Signed)
Escorted pt to CT, then to 2H. Pt's [facility]

## 2015-01-16 NOTE — Progress Notes (Signed)
Came up to the unit to visit another pt. And was informed by the nurse that [name] had died. Met with the family and offered prayer and spiritual support.

## 2015-01-16 NOTE — Progress Notes (Signed)
Changes made per Joneen RoachPaul Hoffman per ABG

## 2015-01-16 NOTE — Progress Notes (Signed)
While  in ED Chaplain was notified of post CPR .a Pt had family in the waiting area and was asked to escort family back. Grandson and girlfriend was escorted back where there were notified of the condition of pt. While pt was cleaned up and prepared to be visited , Chaplain prayed with family. Other family came as Lunette StandsChaplain  was escorted family back.The family was with pt until pt was taken to Cath Lab. Family was escorted to 2 H waiting area. They said they didn't need anything.    2014-04-18 0500  Clinical Encounter Type  Visited With Patient and family together  Visit Type Spiritual support  Referral From Nurse  Spiritual Encounters  Spiritual Needs Prayer;Emotional  Stress Factors  Patient Stress Factors Health changes  Family Stress Factors Health changes  Advance Directives (For Healthcare)  Does patient have an advance directive? No  Would patient like information on creating an advanced directive? No - patient declined information

## 2015-01-16 NOTE — Progress Notes (Signed)
ANTICOAGULATION CONSULT NOTE - Initial Consult  Pharmacy Consult for heparin Indication: chest pain/ACS  Allergies  Allergen Reactions  . Neurontin [Gabapentin] Other (See Comments)    Had about every known side effect with 300mg  three a day on this. DO NOT TRY THIS AGAIN!  . Ativan [Lorazepam] Nausea Only and Other (See Comments)    Disoriented, not feel like herself   . Ferrous Gluconate Nausea Only    hypotension    Patient Measurements: Height: 4' 8.5" (143.5 cm) Weight: 190 lb 14.7 oz (86.6 kg) IBW/kg (Calculated) : 37.45 Heparin Dosing Weight: 58kg  Vital Signs: Temp: 98.2 F (36.8 C) (12/08 1151) Temp Source: Axillary (12/08 1151) BP: 138/76 mmHg (12/08 1151) Pulse Rate: 117 (12/08 1151)  Labs:  Recent Labs  01/08/2015 2213 08/24/14 0118 08/24/14 0418 08/24/14 0751  HGB 11.2* 11.6*  --   --   HCT 36.5 37.6  --   --   PLT 198 204  --   --   LABPROT 16.9*  --   --   --   INR 1.36  --   --   --   CREATININE 4.74* 4.46* 4.93*  --   TROPONINI 0.21* 0.44*  --  2.71*    Estimated Creatinine Clearance: 9.3 mL/min (by C-G formula based on Cr of 4.93).   Medical History: Past Medical History  Diagnosis Date  . Essential hypertension, benign   . Hypercholesterolemia   . GERD (gastroesophageal reflux disease)   . Asthma   . Osteoporosis   . ESRD (end stage renal disease) on dialysis (HCC)   . Anemia of chronic disease   . Peripheral neuropathy (HCC)   . Schizoaffective disorder   . Arthritis   . History of pneumonia    Assessment: 75 year old with end-stage renal disease, COPD, schizoaffective disorder. Admitted after a PEA cardiac arrest at home. She was complaining of chest pain preceding this episode.  Troponin has bumped to 2.71 this morning, new orders to start IV heparin for rule out ACS. She received sq heparin early this am. Hgb stable at 11, pltc within normal limit. She was not on any anticoagulants prior to admission.  Goal of Therapy:  Heparin  level 0.3-0.7 units/ml Monitor platelets by anticoagulation protocol: Yes   Plan:  Give 3000 units bolus x 1 Start heparin infusion at 750 units/hr Check anti-Xa level in 8 hours and daily while on heparin Continue to monitor H&H and platelets  Sheppard CoilFrank Donnovan Stamour PharmD., BCPS Clinical Pharmacist Pager [phone] 12/22/2014 12:20 PM

## 2015-01-16 NOTE — Progress Notes (Signed)
ANTIBIOTIC CONSULT NOTE - INITIAL  Pharmacy Consult for Cipro Indication: UTI  Allergies  Allergen Reactions  . Neurontin [Gabapentin] Other (See Comments)    Had about every known side effect with 300mg  three a day on this. DO NOT TRY THIS AGAIN!  . Ativan [Lorazepam] Nausea Only and Other (See Comments)    Disoriented, not feel like herself   . Ferrous Gluconate Nausea Only    hypotension   Labs:  Recent Labs  07/23/14 2213  WBC 13.7*  HGB 11.2*  PLT 198  CREATININE 4.74*   Medical History: Past Medical History  Diagnosis Date  . Essential hypertension, benign   . Hypercholesterolemia   . GERD (gastroesophageal reflux disease)   . Asthma   . Osteoporosis   . ESRD (end stage renal disease) on dialysis (HCC)   . Anemia of chronic disease   . Peripheral neuropathy (HCC)   . Schizoaffective disorder   . Arthritis   . History of pneumonia     Assessment/Plan:  75yo female presents to ED s/p CPR w/ ROSC after ~40 minutes, UA found to be abnormal, to begin IV ABX.  Will start Cipro 400mg  IV Q24H for ESRD and monitor CBC and Cx.  Vernard GamblesVeronda Zuleika Gallus, PharmD, BCPS  12/24/2014,12:59 AM

## 2015-01-16 NOTE — Procedures (Signed)
Central Venous Catheter Insertion Procedure Note [name] [id number] 1940-07-17  Procedure: Insertion of Central Venous Catheter Indications: Assessment of intravascular volume, Drug and/or fluid administration and Frequent blood sampling  Procedure Details Consent: Risks of procedure as well as the alternatives and risks of each were explained to the (patient/caregiver).  Consent for procedure obtained. Time Out: Verified patient identification, verified procedure, site/side was marked, verified correct patient position, special equipment/implants available, medications/allergies/relevent history reviewed, required imaging and test results available.  Performed  Maximum sterile technique was used including antiseptics, cap, gloves, gown, hand hygiene, mask and sheet. Skin prep: Chlorhexidine; local anesthetic administered A antimicrobial bonded/coated triple lumen catheter was placed in the left femoral vein (due to ESRD patient with no clear internal jugular veins on US assessment and patient was agitated due to sedation drips off and no IV access) using the Seldinger technique. Ultrasound guidance used.Yes.   Catheter placed to 22 cm. Blood aspirated via all 3 ports and then flushed x 3. Line sutured x 2 and dressing applied.  Evaluation Blood flow good Complications: No apparent complications Patient did tolerate procedure well. Chest X-ray ordered to verify placement.  CXR: pending.  Joneen RoachPaul Altovise Wahler, AGACNP-BC [facility] Pager [phone] or [phone]  01/15/2015 3:51 AM

## 2015-01-16 NOTE — H&P (Signed)
PULMONARY / CRITICAL CARE MEDICINE   Name: IVALEE STRAUSER MRN: [id number] DOB: 1940-02-11    ADMISSION DATE:  12/26/2014 CONSULTATION DATE:  12/7  REFERRING MD:  EDP  CHIEF COMPLAINT:  Cardiac arrest  HISTORY OF PRESENT ILLNESS:   75 year old female past medical history as below, which is significant for ESRD on HD, COPD, and schizoaffective disorder. She undergoes dialysis treatments Monday Wednesday Friday and had a normal dialysis treatment 12/7. Shortly after returning home she did complain of some substernal chest pain, but did not have any other complaints. Later on in the evening she has shortness of breath and was using a nebulizer treatment, her daughter who she was up against the door and when she came back about 5 minutes later her mother was unresponsive. CPR was initiated almost immediately by family member which continued for about 10 minutes until EMS arrived. They noted initial rhythm to be PEA and continued ACLS for an additional 30 minutes. Overall she was administered 5 mg of epinephrine, Narcan, calcium, and sodium bicarbonate. She was intubated in the field and transferred to [facility] Emergency Department. Time from arrest door was about an hour and a half. She was unresponsive even to pain in the emergency department on the ventilator. She has been unarousable since time of arrest and has been no problems with movement.   Labs in the emergency department and notable for a lactic acid 4.8, troponin 0.21, and mild leukocytosis with a white count of 13.7. She was also hyperglycemic with glucose 221. PCCM asked to admit.  Of note she was recently admitted for chest pain during dialysis. She was ruled out for MI, and entire cardiac workup was essentially benign. Amiodarone was recently d/c'd in favor of increased labetalol dose. She had Lexiscan myoview in 2015 that was low risk.   PAST MEDICAL HISTORY :  She  has a past medical history of Essential hypertension, benign;  Hypercholesterolemia; GERD (gastroesophageal reflux disease); Asthma; Osteoporosis; ESRD (end stage renal disease) on dialysis ([place]); Anemia of chronic disease; Peripheral neuropathy ([place]); Schizoaffective disorder; Arthritis; and History of pneumonia.  PAST SURGICAL HISTORY: She  has past surgical history that includes Tubal ligation; Breast biopsy; Refractive surgery; Bone marrow aspiration; Bone marrow biopsy; Insertion of dialysis catheter (01/23/2011); Yag laser application (40/81/4481); Esophagogastroduodenoscopy (05/06/2003); Colonoscopy (03/10/2010); Esophagogastroduodenoscopy (N/A, 05/11/2014); and Esophageal dilation (N/A, 05/11/2014).  Allergies  Allergen Reactions  . Neurontin [Gabapentin] Other (See Comments)    Had about every known side effect with $RemoveBef'300mg'ZZchbGIYgK$  three a day on this. DO NOT TRY THIS AGAIN!  . Ativan [Lorazepam] Nausea Only and Other (See Comments)    Disoriented, not feel like herself   . Ferrous Gluconate Nausea Only    hypotension    No current facility-administered medications on file prior to encounter.   Current Outpatient Prescriptions on File Prior to Encounter  Medication Sig  . ALPRAZolam (XANAX) 0.5 MG tablet Take 1 tablet (0.5 mg total) by mouth 3 (three) times daily.  Marland Kitchen amitriptyline (ELAVIL) 25 MG tablet Take 1 tablet (25 mg total) by mouth at bedtime.  Marland Kitchen atorvastatin (LIPITOR) 40 MG tablet Take 1 tablet by mouth daily.  Marland Kitchen donepezil (ARICEPT) 5 MG tablet Take 1 tablet (5 mg total) by mouth at bedtime.  . gabapentin (NEURONTIN) 100 MG capsule Take 100 mg by mouth daily.  Marland Kitchen ipratropium (ATROVENT) 0.02 % nebulizer solution Take 500 mcg by nebulization 4 (four) times daily as needed. Shortness of Breath  . labetalol (NORMODYNE) 200 MG tablet Take  1 tablet (200 mg total) by mouth 2 (two) times daily.  Marland Kitchen NIFEdipine (PROCARDIA-XL/ADALAT-CC/NIFEDICAL-XL) 30 MG 24 hr tablet Take 2 tablets (60 mg total) by mouth daily. Ok to take 2 of current rx and I sent in rx for 60  mg  . Oxycodone HCl 10 MG TABS Take 1 tablet by mouth 4 (four) times daily as needed.  . pantoprazole (PROTONIX) 40 MG tablet Take 40 mg by mouth daily.  . risperiDONE (RISPERDAL) 1 MG tablet Take 2 tablets (2 mg total) by mouth at bedtime.  Marland Kitchen tiZANidine (ZANAFLEX) 2 MG tablet Take 1 tablet by mouth 2 (two) times daily as needed for muscle spasms.     FAMILY HISTORY:  Her indicated that her mother is deceased. She indicated that her father is deceased. She indicated that her brother is alive. She indicated that her maternal grandmother is deceased. She indicated that her maternal grandfather is deceased. She indicated that her paternal grandmother is deceased. She indicated that her paternal grandfather is deceased. She indicated that all of her three daughters are alive.   SOCIAL HISTORY: She  reports that she quit smoking about 16 years ago. Her smoking use included Cigarettes. She has a 74 pack-year smoking history. She quit smokeless tobacco use about 13 years ago. She reports that she does not drink alcohol or use illicit drugs.  REVIEW OF SYSTEMS:   Unable due to encephalopathy  SUBJECTIVE:    VITAL SIGNS: BP 152/99 mmHg  Pulse 83  Temp(Src) 96.3 F (35.7 C)  Resp 22  Ht 4' 8.5" (1.435 m)  Wt 86.183 kg (190 lb)  BMI 41.85 kg/m2  SpO2 96%  HEMODYNAMICS:    VENTILATOR SETTINGS: Vent Mode:  [-] PRVC FiO2 (%):  [60 %] 60 % Set Rate:  [14 bmp-28 bmp] 28 bmp Vt Set:  [300 mL] 300 mL PEEP:  [5 cmH20] 5 cmH20 Plateau Pressure:  [18 cmH20] 18 cmH20  INTAKE / OUTPUT:    PHYSICAL EXAMINATION: General:  Morbidly obese female Neuro:  Comatose HEENT:  [place]/AT, no appreciable JVD, pupils sluggish. Pink frothy secretions from mouth and ETT Cardiovascular:  RRR, no MRG Lungs:  Coarse bilateral breath sounds Abdomen:  Soft, non-tender, non-distended Musculoskeletal:  No acute deformity, no edema Skin:  Grossly intact  LABS:  BMET  Recent Labs Lab 01/14/2015 2213  NA 135   K 4.0  CL 101  CO2 23  BUN 12  CREATININE 4.74*  GLUCOSE 221*    Electrolytes  Recent Labs Lab 01/13/2015 2213  CALCIUM 9.1    CBC  Recent Labs Lab 01/10/2015 2213  WBC 13.7*  HGB 11.2*  HCT 36.5  PLT 198    Coag's  Recent Labs Lab 01/13/2015 2213  INR 1.36    Sepsis Markers  Recent Labs Lab 12/25/2014 2216  LATICACIDVEN 4.82*    ABG  Recent Labs Lab 01/14/2015 2357  PHART 7.151*  PCO2ART 78.0*  PO2ART 99.0    Liver Enzymes  Recent Labs Lab 01/07/2015 2213  AST 235*  ALT 235*  ALKPHOS 66  BILITOT 0.8  ALBUMIN 3.0*    Cardiac Enzymes  Recent Labs Lab 01/09/2015 2213  TROPONINI 0.21*    Glucose No results for input(s): GLUCAP in the last 168 hours.  Imaging Dg Chest Portable 1 View  01/06/2015  CLINICAL DATA:  75 year old female status post endotracheal tube placement. EXAM: PORTABLE CHEST 1 VIEW COMPARISON:  Radiograph dated 11/15/2014 FINDINGS: Endotracheal tube with tip approximately 3.7 cm above the carina. In an enteric  tube is partially visualized coursing to the left upper abdomen with tip beyond the image cut off. There is a focal area of increased opacity in the right hilar region which may represent pneumonia. Underlying mass is not excluded. Clinical correlation and follow-up recommended. There is stable cardiomegaly. Left costophrenic angle is excluded from the image. The right costophrenic angle is sharp. The osseous structures are unremarkable. Left subclavian vascular stent noted. IMPRESSION: Endotracheal tube above the carina. Right hilar opacity.  Follow-up recommended. Electronically Signed   By: Anner Crete M.D.   On: 12/21/2014 22:46    STUDIES:  CT head 12/7 >>>  CULTURES: BCx2 12/7 >>> Urine 12/7 >>> Tracheal aspirate 12/7 >>>  ANTIBIOTICS: Zosyn 12/8 >>> Vancomycin 12/8 >>>  SIGNIFICANT EVENTS: 12/7 cardiac arrest  LINES/TUBES: ETT 12/7 >>>  DISCUSSION:  75 year old female with end-stage renal disease on  dialysis presented to [facility] emergency department 12/7 status post cardiac arrest. Total downtime estimated about 40 minutes initial rhythm PEA. She is completely unresponsive in the emergency department and has maintained a GCS of 3 since the time of arrest. Etiology of cardiac arrest unknown at this point. Will admit to ICU and maintain on ventilator currently maintaining blood pressure. We'll trend cardiac enzymes, repeat EKG, obtain echocardiogram, review CT head to rule out ICH. Currently hypoxemic on 40% FiO2 with pink frothy secretions from ET tube. Will  attempt to diuresis with Lasix as she still does make some urine, however, if this does not improve will consult nephrology for likely HD tonight.   ASSESSMENT / PLAN:  PULMONARY A: Acute hypoxemic respiratory failure secondary to cardiac arrest Pulmonary edema Doubt PE (Wells score = 0) COPD without evidence of acute exacerbation  P:   Full vent support May need high PEEP ventilation if O2 sats not improving, hope will improve with diuresis Follow CXR, ABG Vent bundle  CARDIOVASCULAR A:  Cardiac arrest (PEA), uncertain etiology Acute CHF secondary to cardiac arrest (LVEF 60% grade 1 DD. 11/16/14) Elevated troponin, concern primary cardiac event.  Lactic acidosis  P:  Telemetry monitoring MAP goal > 76mmHg Not a candidate for therapeutic hypothermia based on prolonged downtime and PEA arrest Diurese (Lasix $RemoveBefore'80mg'uSsHZBOMZdhuf$  now) Trend troponin Repeat lactic to ensure clearance Echocardiogram Repeat ECG in AM Low threshold to initiate heparin gtt   RENAL A:   ESRD on HD (MWF)  P:   Consult nephrology in AM, or earlier if difficult to oxygenate. Serial BMP  GASTROINTESTINAL A:   GERD  P:   NPO Pepcid for SUP  HEMATOLOGIC A:   Anemia of chronic illness - at baseline  P:  Follow CBC Heparin for VTE ppx  INFECTIOUS A:   UTI HCAP (recent admit) vs (more likely)aspiration PNA   P:   Cultures as above ABX as  above (vanc/zosyn) Trend WBC and fever curve  ENDOCRINE A:   Hyperglycemia without history of DM  P:   CBG monitoring and SSI HgbA1c  NEUROLOGIC A:   Acute anoxic/metabolic encephalopathy Schizoaffective disorder  P:   RASS goal: 0 to -1 Fentanyl infusion PRN versed CT head EEG   FAMILY  - Updates: Family updated in ED by [facility]. Sister driving in from [place] is an NP. She wishes to provide full medical care including vasopressors if needed for 48 hours. Will re-evaluate goals of care at that time. DNR if arrests.   - Inter-disciplinary family meet or Palliative Care meeting due by: 12/15  Georgann Housekeeper, AGACNP-BC [place] [facility]/Critical Care Pager [phone] or [phone]  01/16/2015 1:26 AM

## 2015-01-16 NOTE — Discharge Summary (Signed)
Name:Lasha Judie PetitM Roop RUE:[id number]:[id number] DOB:[birth year]   ADMISSION DATE: 01/11/2015 DATE OF DEATH: 01/10/2015  ISSUES: Cardiac arrest  Mrs. Hossain is a 75 year old with end-stage renal disease, COPD, schizoaffective disorder. Admitted after a PEA cardiac arrest at home. She was complaining of chest pain preceding this episode. She was intubated and brought to the ICU. She did not get hypothermia protocol because she was down for a prolonged period.   She continued to be unresponsive in the ICU off all sedation. Labs notable for elevated lactic acid and troponin. Chest x-ray shows a right hilar infiltrate concerning for pneumonia. CT of the head is negative for any acute changes.   Etiology of her arrest is unclear. She could have had respiratory arrest from hypoxia, pneumonia. CTA was ordered to rule out PE. Elevated troponin  Was concerning for an acute coronary syndrome. She was put on broad-spectrum antibiotics started on aspirin and heparin anticoagulation and nephrology and cardiology were consulted. EEG done which is consistent with diffuse anoxic injury.   While awaiting family to arrive she had bradycardia and went into PEA arrest. Due to her DO NOT RESUSCITATE status she was not coded. Family arrived shortly after this event.  Chilton GreathousePraveen Rashanna Christiana MD [facility] Pager [phone] If no answer or after 3pm call: [phone] 12/30/2014, 2:52 PM

## 2015-01-16 NOTE — Progress Notes (Signed)
Wasted 100mg  IV Fentanyl verified by Jonetta SpeakElla Alaena Strader RN and Noah CharonPenny Shelton RN

## 2015-01-16 DEATH — deceased

## 2015-03-03 ENCOUNTER — Ambulatory Visit (HOSPITAL_COMMUNITY): Payer: Self-pay | Admitting: Psychiatry

## 2015-05-24 NOTE — Telephone Encounter (Signed)
ERROR

## 2017-08-26 IMAGING — DX DG CHEST 2V
2 series · 2 of 2 positions shown · non-contrast
Comparison: 04/21/2014.  12/07/2013.

CLINICAL DATA: Initial encounter for chest pain with bronchitis.

EXAM:
CHEST  2 VIEW

[chest lat]
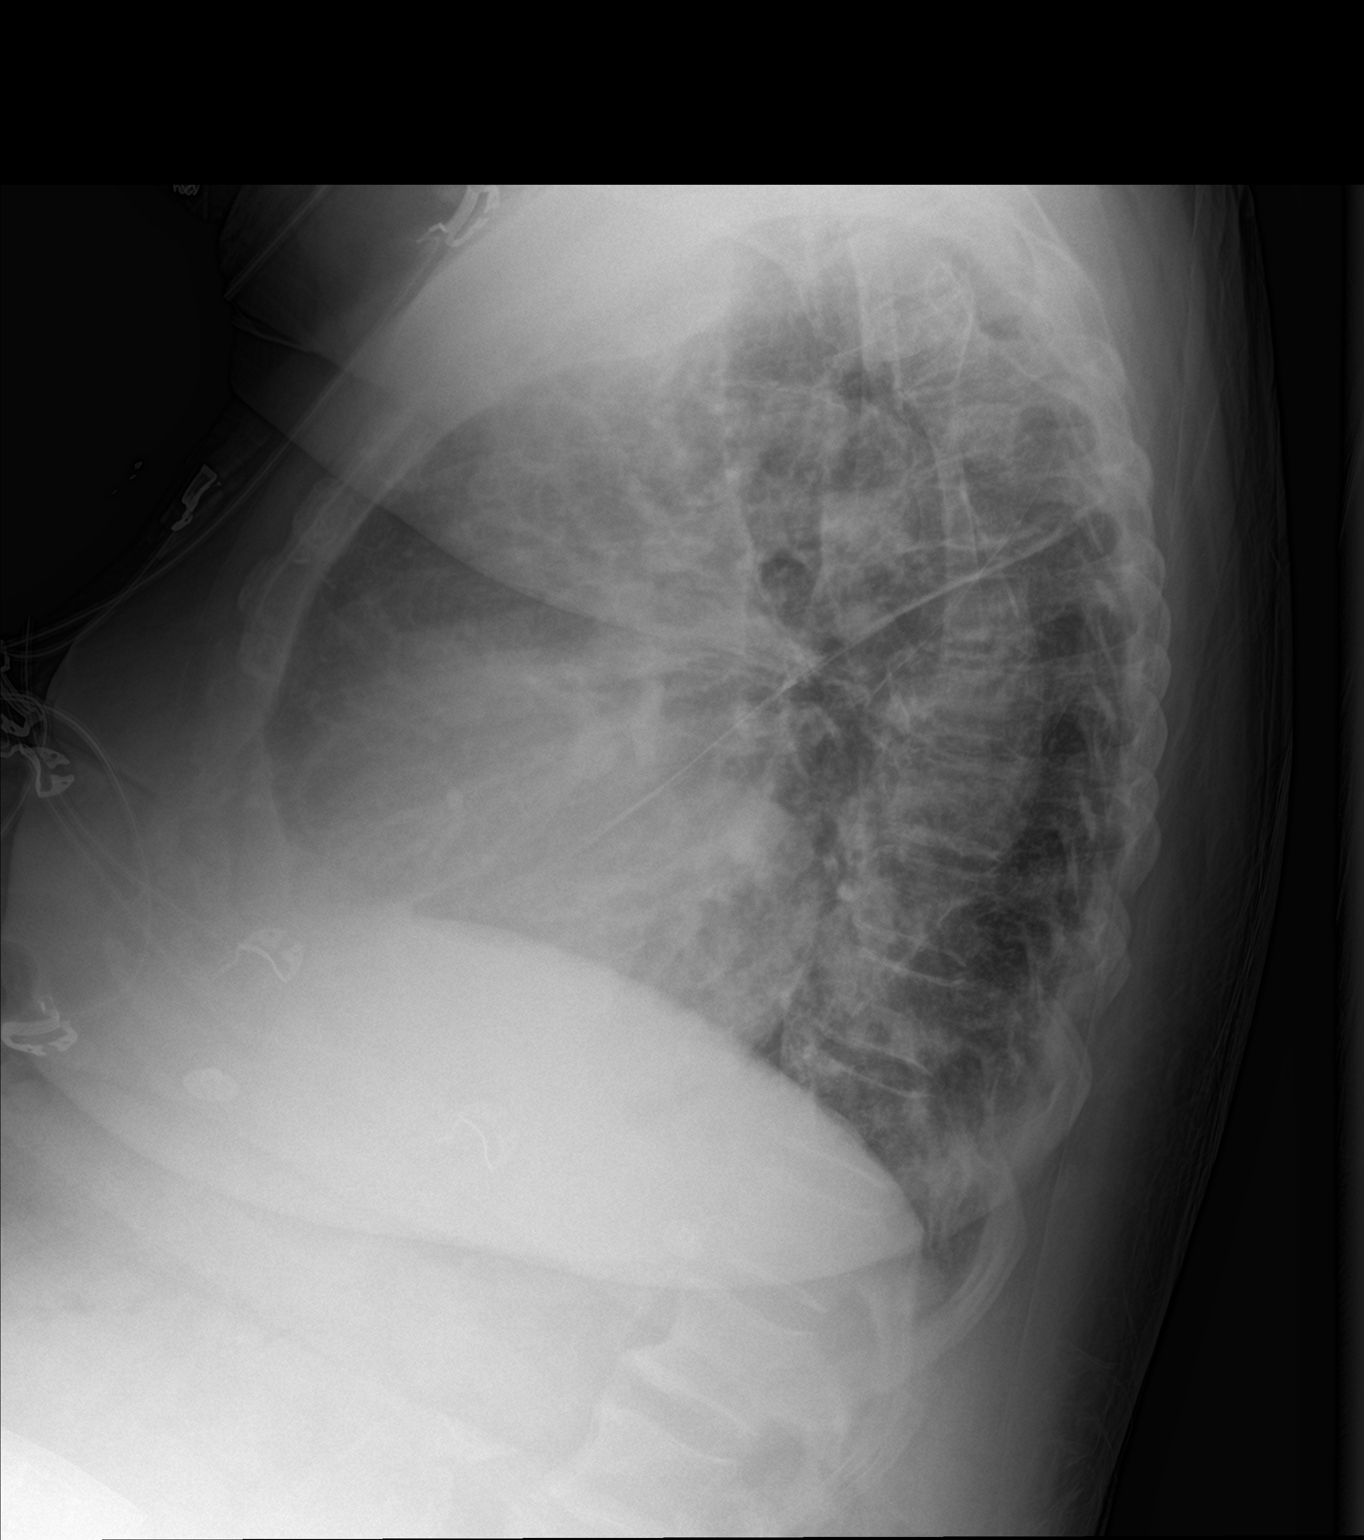

[chest ap]
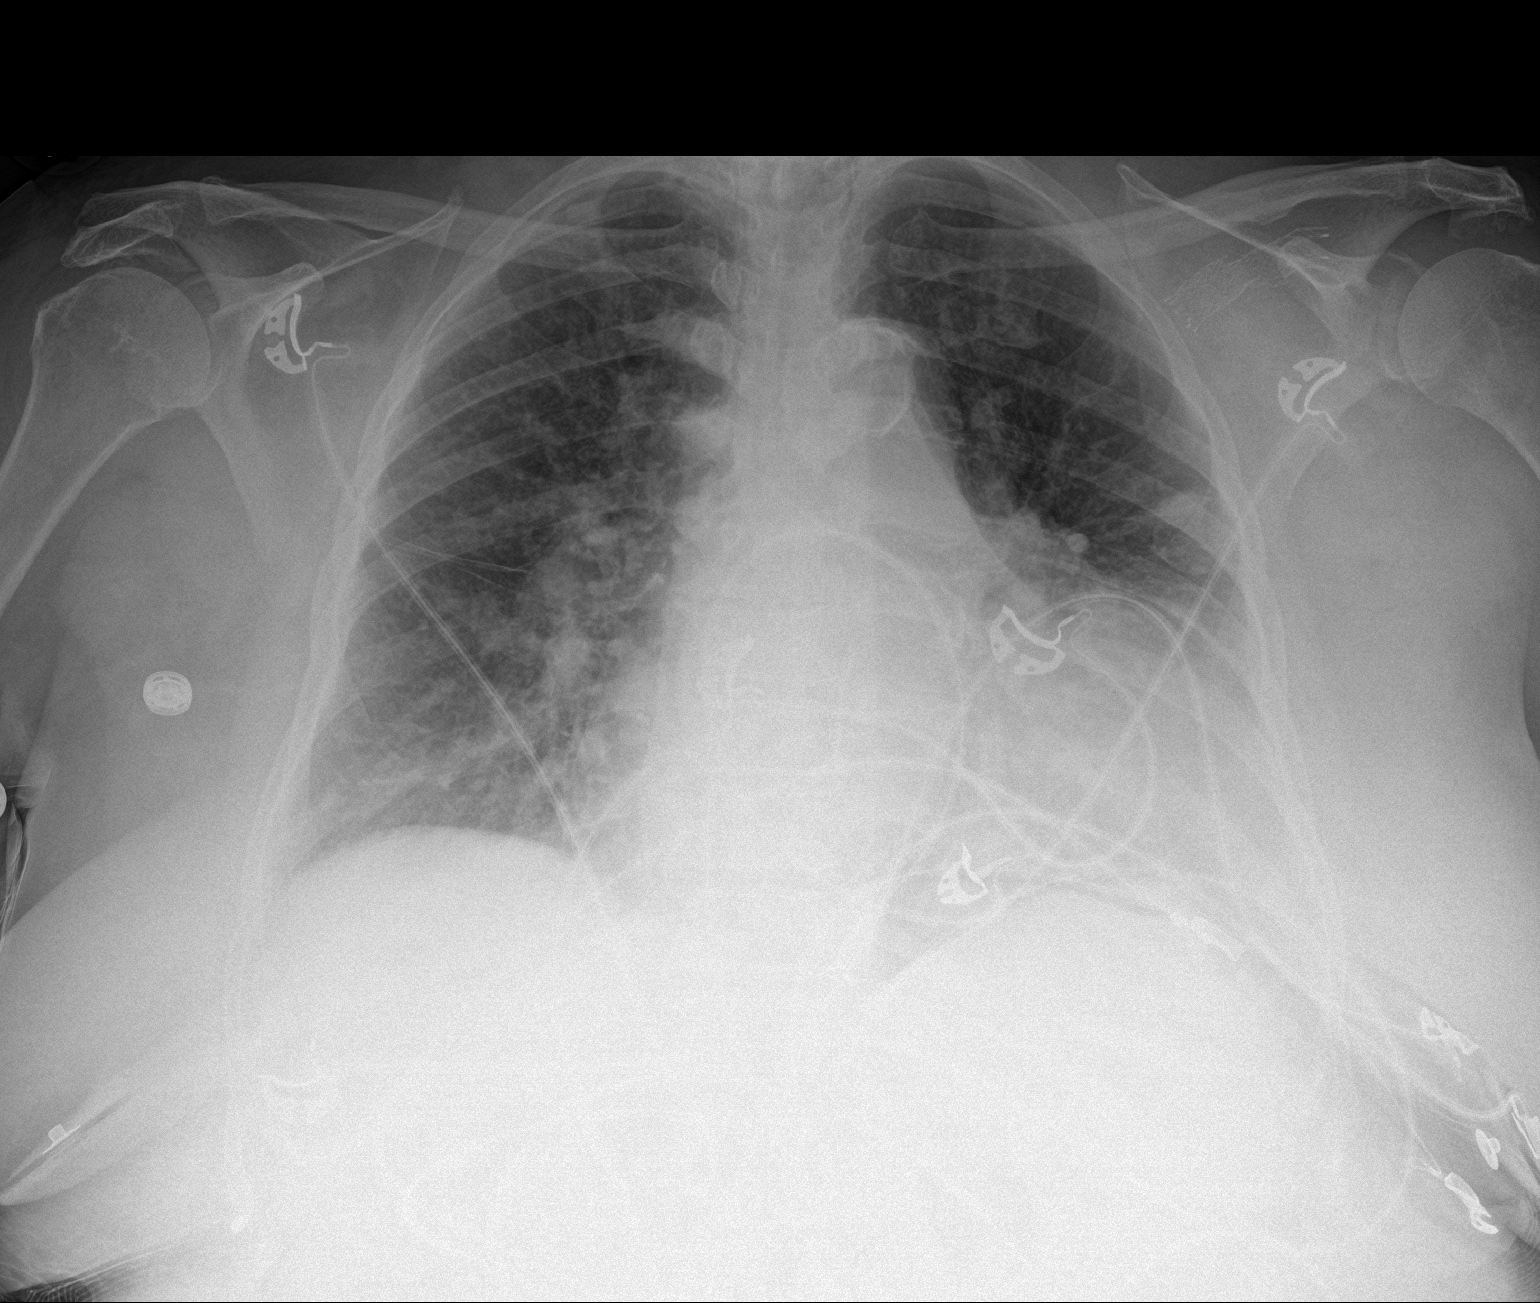

[2 of 2 positions shown; findings below may reference images not displayed]

FINDINGS: Low volume film. There is pulmonary vascular congestion without
overt pulmonary edema. The cardio pericardial silhouette is
enlarged. Scarring in the left mid lung is unchanged. Vascular stent
device noted in the left subclavian region. No substantial pleural
effusion. Telemetry leads overlie the chest.
IMPRESSION: Stable exam. Cardiomegaly with vascular congestion and scarring in
the left mid lung.
